# Patient Record
Sex: Female | Born: 1945
Health system: Southern US, Community
[De-identification: ages and names within clinical notes are randomized; demographics above are authoritative.]

## PROBLEM LIST (undated history)

## (undated) DIAGNOSIS — Z9889 Other specified postprocedural states: Secondary | ICD-10-CM

## (undated) DIAGNOSIS — R351 Nocturia: Secondary | ICD-10-CM

## (undated) DIAGNOSIS — M1990 Unspecified osteoarthritis, unspecified site: Secondary | ICD-10-CM

## (undated) DIAGNOSIS — E785 Hyperlipidemia, unspecified: Secondary | ICD-10-CM

## (undated) DIAGNOSIS — K219 Gastro-esophageal reflux disease without esophagitis: Secondary | ICD-10-CM

## (undated) DIAGNOSIS — I509 Heart failure, unspecified: Secondary | ICD-10-CM

## (undated) DIAGNOSIS — R112 Nausea with vomiting, unspecified: Secondary | ICD-10-CM

## (undated) DIAGNOSIS — I10 Essential (primary) hypertension: Secondary | ICD-10-CM

## (undated) HISTORY — PX: TONSILLECTOMY: SUR1361

## (undated) HISTORY — PX: CARDIAC CATHETERIZATION: SHX172

## (undated) HISTORY — PX: COLONOSCOPY: SHX174

## (undated) HISTORY — PX: ADRENAL GLAND SURGERY: SHX544

## (undated) HISTORY — DX: Heart failure, unspecified: I50.9

## (undated) HISTORY — PX: BUNIONECTOMY: SHX129

## (undated) HISTORY — PX: TUBAL LIGATION: SHX77

## (undated) HISTORY — PX: ABDOMINAL HYSTERECTOMY: SHX81

## (undated) HISTORY — PX: JOINT REPLACEMENT: SHX530

---

## 1998-05-10 HISTORY — PX: AUGMENTATION MAMMAPLASTY: SUR837

## 1998-07-03 ENCOUNTER — Ambulatory Visit (HOSPITAL_COMMUNITY): Admission: RE | Admit: 1998-07-03 | Discharge: 1998-07-03 | Payer: Self-pay | Admitting: Specialist

## 1999-01-07 ENCOUNTER — Emergency Department (HOSPITAL_COMMUNITY): Admission: EM | Admit: 1999-01-07 | Discharge: 1999-01-07 | Payer: Self-pay | Admitting: Emergency Medicine

## 1999-05-28 ENCOUNTER — Ambulatory Visit (HOSPITAL_COMMUNITY): Admission: RE | Admit: 1999-05-28 | Discharge: 1999-05-28 | Payer: Self-pay | Admitting: General Surgery

## 1999-12-25 ENCOUNTER — Encounter: Admission: RE | Admit: 1999-12-25 | Discharge: 1999-12-25 | Payer: Self-pay | Admitting: Family Medicine

## 1999-12-25 ENCOUNTER — Encounter: Payer: Self-pay | Admitting: Family Medicine

## 1999-12-30 ENCOUNTER — Encounter: Admission: RE | Admit: 1999-12-30 | Discharge: 1999-12-30 | Payer: Self-pay | Admitting: Family Medicine

## 1999-12-30 ENCOUNTER — Encounter: Payer: Self-pay | Admitting: Family Medicine

## 2000-12-13 ENCOUNTER — Encounter: Admission: RE | Admit: 2000-12-13 | Discharge: 2000-12-13 | Payer: Self-pay | Admitting: Family Medicine

## 2000-12-13 ENCOUNTER — Encounter: Payer: Self-pay | Admitting: Family Medicine

## 2001-04-11 ENCOUNTER — Encounter: Payer: Self-pay | Admitting: Cardiology

## 2001-04-11 ENCOUNTER — Ambulatory Visit (HOSPITAL_COMMUNITY): Admission: RE | Admit: 2001-04-11 | Discharge: 2001-04-11 | Payer: Self-pay | Admitting: Cardiology

## 2001-09-09 ENCOUNTER — Emergency Department (HOSPITAL_COMMUNITY): Admission: EM | Admit: 2001-09-09 | Discharge: 2001-09-09 | Payer: Self-pay | Admitting: Emergency Medicine

## 2001-11-16 ENCOUNTER — Encounter: Payer: Self-pay | Admitting: Emergency Medicine

## 2001-11-16 ENCOUNTER — Emergency Department (HOSPITAL_COMMUNITY): Admission: EM | Admit: 2001-11-16 | Discharge: 2001-11-16 | Payer: Self-pay | Admitting: Emergency Medicine

## 2001-12-13 ENCOUNTER — Encounter: Admission: RE | Admit: 2001-12-13 | Discharge: 2001-12-13 | Payer: Self-pay | Admitting: Cardiology

## 2001-12-13 ENCOUNTER — Encounter: Payer: Self-pay | Admitting: Cardiology

## 2004-07-02 ENCOUNTER — Encounter: Admission: RE | Admit: 2004-07-02 | Discharge: 2004-07-02 | Payer: Self-pay | Admitting: Cardiology

## 2005-01-15 ENCOUNTER — Encounter: Admission: RE | Admit: 2005-01-15 | Discharge: 2005-01-15 | Payer: Self-pay | Admitting: Cardiology

## 2005-01-26 ENCOUNTER — Ambulatory Visit (HOSPITAL_COMMUNITY): Admission: RE | Admit: 2005-01-26 | Discharge: 2005-01-26 | Payer: Self-pay | Admitting: Gastroenterology

## 2006-02-23 ENCOUNTER — Encounter: Admission: RE | Admit: 2006-02-23 | Discharge: 2006-02-23 | Payer: Self-pay | Admitting: Cardiology

## 2008-03-28 ENCOUNTER — Encounter: Admission: RE | Admit: 2008-03-28 | Discharge: 2008-03-28 | Payer: Self-pay | Admitting: Cardiology

## 2008-04-08 ENCOUNTER — Encounter: Admission: RE | Admit: 2008-04-08 | Discharge: 2008-04-08 | Payer: Self-pay | Admitting: Cardiology

## 2008-05-25 ENCOUNTER — Emergency Department (HOSPITAL_COMMUNITY): Admission: EM | Admit: 2008-05-25 | Discharge: 2008-05-25 | Payer: Self-pay | Admitting: Emergency Medicine

## 2010-04-13 ENCOUNTER — Encounter: Admission: RE | Admit: 2010-04-13 | Discharge: 2010-04-13 | Payer: Self-pay | Admitting: Cardiology

## 2010-05-31 ENCOUNTER — Encounter: Payer: Self-pay | Admitting: Cardiology

## 2010-09-25 NOTE — Op Note (Signed)
NAME:  Tara Moreno, Tara Moreno              ACCOUNT NO.:  [id number]   MEDICAL RECORD NO.:  [id number]          PATIENT TYPE:  AMB   LOCATION:  ENDO                         FACILITY:  [facility]   PHYSICIAN:  Shirley Friar, MDDATE OF BIRTH:  [birth year]   DATE OF PROCEDURE:  01/26/2005  DATE OF DISCHARGE:                                 OPERATIVE REPORT   PREOPERATIVE DIAGNOSIS:  Screening.   POSTOPERATIVE DIAGNOSES:  1.  Diverticulosis.  2.  Hemorrhoids.   NAME OF OPERATION:  Colonoscopy.   SEDATION:  Demerol 80 mg, versed 8 mg.   PROCEDURE:  Informed consent was obtained from the patient prior to the  initiation of the procedure.   FINDINGS:  Rectal exam was normal with normal rectal tone and no masses  palpated and no external hemorrhoids noted.  The pediatric adjustable  colonoscope was inserted and advanced to the cecum, where the ileocecal  valve and appendiceal orifice were identified.  The ileocecal valve was  intubated and the terminal ileum was normal in appearance.  Careful  withdrawal of the colonoscope revealed no polyps.  Left-sided diverticulosis  was noted with shallow scattered diverticula noted.  Retroflexion revealed  small internal hemorrhoids.  Otherwise was negative.  The colonoscopy prep  was adequate.   ASSESSMENT:  1.  Left-sided diverticulosis.  2.  Small internal hemorrhoids.   PLAN:  1.  Repeat colonoscopy in 10 years.      Shirley Friar, MD  Electronically Signed     VCS/MEDQ  D:  01/26/2005  T:  01/26/2005  Job:  [id number]   cc:   Althea Grimmer. Luther Parody, M.D.  [address], [address]  [place]  [place] [address]  Fax: [phone]

## 2011-04-25 ENCOUNTER — Emergency Department (HOSPITAL_COMMUNITY)
Admission: EM | Admit: 2011-04-25 | Discharge: 2011-04-25 | Disposition: A | Discharge status: Home / Self Care | Payer: 59 | Attending: Emergency Medicine | Admitting: Emergency Medicine

## 2011-04-25 ENCOUNTER — Encounter: Payer: Self-pay | Admitting: Emergency Medicine

## 2011-04-25 DIAGNOSIS — I10 Essential (primary) hypertension: Secondary | ICD-10-CM

## 2011-04-25 DIAGNOSIS — R040 Epistaxis: Secondary | ICD-10-CM | POA: Insufficient documentation

## 2011-04-25 DIAGNOSIS — Z79899 Other long term (current) drug therapy: Secondary | ICD-10-CM | POA: Insufficient documentation

## 2011-04-25 HISTORY — DX: Essential (primary) hypertension: I10

## 2011-04-25 LAB — CBC
Hemoglobin: 13.4 g/dL (ref 12.0–15.0)
MCH: 29.6 pg (ref 26.0–34.0)
MCHC: 33.1 g/dL (ref 30.0–36.0)
RDW: 13.8 % (ref 11.5–15.5)

## 2011-04-25 LAB — DIFFERENTIAL
Basophils Relative: 0 % (ref 0–1)
Eosinophils Absolute: 0.1 10*3/uL (ref 0.0–0.7)
Monocytes Absolute: 0.7 10*3/uL (ref 0.1–1.0)
Monocytes Relative: 9 % (ref 3–12)
Neutrophils Relative %: 72 % (ref 43–77)

## 2011-04-25 LAB — POCT I-STAT, CHEM 8
Calcium, Ion: 1.24 mmol/L (ref 1.12–1.32)
Glucose, Bld: 110 mg/dL — ABNORMAL HIGH (ref 70–99)
HCT: 41 % (ref 36.0–46.0)
Hemoglobin: 13.9 g/dL (ref 12.0–15.0)
Potassium: 3.7 mEq/L (ref 3.5–5.1)

## 2011-04-25 MED ORDER — AMLODIPINE BESYLATE 10 MG PO TABS: 5.0000 mg | ORAL_TABLET | Freq: Every day | ORAL | Status: DC

## 2011-04-25 MED ORDER — AMLODIPINE BESYLATE 5 MG PO TABS
5.0000 mg | ORAL_TABLET | Freq: Once | ORAL | Status: AC
  Administered 2011-04-25: 5 mg via ORAL
  Filled 2011-04-25: qty 1

## 2011-04-25 NOTE — ED Notes (Signed)
Per pt: pt states that when she bend over she felt a few drops, upon retuning to the bedroom, took a tissue and there noticed that her nose is bleeding. Pt further states that she is not taking her BP medications for the past 6 months.

## 2011-04-25 NOTE — ED Notes (Signed)
EAV:[id number]> Expected date:04/25/11<BR> Expected time: 2:41 AM<BR> Means of arrival:Ambulance<BR> Comments:<BR> Epistaxis-hypertension

## 2011-04-25 NOTE — ED Provider Notes (Signed)
History     CSN: [id number] Arrival date & time: 04/25/2011  2:58 AM   First MD Initiated Contact with Patient 04/25/11 0321      Chief Complaint  Patient presents with  . Epistaxis  . Hypertension    (Consider location/radiation/quality/duration/timing/severity/associated sxs/prior treatment) HPI Comments: Patient woke tonight  with nose bleed from bilateral nareas, held pressure at home without relief.  Has history of HTN use until 6 months ago.  Patient was on multiple blood pressure medications.  Dr. Shana Chute was adjusting these he became ill and the process and she was left with no blood pressure medicines medical guidance.  She does have a positive family history for high blood pressure.  He denies headache, dizziness, swelling in her legs secondary signs of hypertension  Patient is a 65 y.o. female presenting with nosebleeds and hypertension. The history is provided by the patient.  Epistaxis  This is a new problem. The current episode started 1 to 2 hours ago. The problem occurs constantly. The bleeding has been from both nares. She has tried applying pressure for the symptoms.  Hypertension Pertinent negatives include no congestion or weakness.    Past Medical History  Diagnosis Date  . Hypertension     Past Surgical History  Procedure Date  . Abdominal hysterectomy   . Removal of the renal tumor   . Tonsillectomy   . Partial hysterectomy   . Banectomy     Family History  Problem Relation Age of Onset  . Hypertension Mother   . Hypertension Father   . Hypertension Sister   . Hypertension Brother   . Hypertension Other     History  Substance Use Topics  . Smoking status: Never Smoker   . Smokeless tobacco: Not on file  . Alcohol Use: No    OB History    Grav Para Term Preterm Abortions TAB SAB Ect Mult Living                  Review of Systems  Constitutional: Negative.   HENT: Positive for nosebleeds. Negative for congestion and rhinorrhea.     Eyes: Negative.   Respiratory: Negative.   Cardiovascular: Negative.   Genitourinary: Negative.   Neurological: Negative for dizziness and weakness.  Hematological: Negative.   Psychiatric/Behavioral: Negative.     Allergies  Review of patient's allergies indicates no known allergies.  Home Medications   Current Outpatient Rx  Name Route Sig Dispense Refill  . AMLODIPINE BESYLATE 10 MG PO TABS Oral Take 0.5 tablets (5 mg total) by mouth daily. 30 tablet 0    BP 160/92  Pulse 90  Resp 16  SpO2 96%  Physical Exam  Constitutional: She is oriented to person, place, and time. She appears well-developed and well-nourished.  HENT:  Nose: Mucosal edema present. No rhinorrhea or nasal septal hematoma. Epistaxis is observed.       After pressure held no longer bleeding   Eyes: Pupils are equal, round, and reactive to light.  Neck: Normal range of motion.  Cardiovascular: Normal rate.   Pulmonary/Chest: Effort normal.  Musculoskeletal: Normal range of motion.  Neurological: She is oriented to person, place, and time.  Skin: Skin is warm and dry.  Psychiatric: She has a normal mood and affect.    ED Course  Procedures (including critical care time)  Labs Reviewed  POCT I-STAT, CHEM 8 - Abnormal; Notable for the following:    BUN 29 (*)    Glucose, Bld 110 (*)  All other components within normal limits  CBC  DIFFERENTIAL  I-STAT, CHEM 8   No results found.   1. Epistaxis   2. Hypertension      After 5 mg by mouth, Norvasc.  Blood pressures are 154/90.  Will discharge him with prescription for same.  Encouraged her to either find a new primary care physician or make an appointment with her physicians covering team until he returns.  She had no further episodes of epistaxis MDM  Epistaxis due to HTN will eval renal function and medicate         Arman Filter, NP 04/25/11 0342  Arman Filter, NP 04/25/11 0557  Arman Filter, NP 04/25/11 0602  Arman Filter, NP 04/25/11 2303

## 2011-04-25 NOTE — ED Notes (Signed)
Per EMS: pt transported to the ER from home, c/o nose bleed, started around 0145, pt got up to use the bathroom, at that time acute/sudden onset of epistaxis, pt BP over 200's in the truck

## 2011-04-25 NOTE — ED Provider Notes (Signed)
Medical screening examination/treatment/procedure(s) were performed by non-physician practitioner and as supervising physician I was immediately available for consultation/collaboration.   Vida Roller, MD 04/25/11 [phone]

## 2011-05-17 ENCOUNTER — Emergency Department (HOSPITAL_COMMUNITY): Payer: 59

## 2011-05-17 ENCOUNTER — Emergency Department (HOSPITAL_COMMUNITY)
Admission: EM | Admit: 2011-05-17 | Discharge: 2011-05-17 | Disposition: A | Discharge status: Home / Self Care | Payer: 59 | Attending: Emergency Medicine | Admitting: Emergency Medicine

## 2011-05-17 ENCOUNTER — Encounter (HOSPITAL_COMMUNITY): Payer: Self-pay

## 2011-05-17 ENCOUNTER — Other Ambulatory Visit: Payer: Self-pay

## 2011-05-17 DIAGNOSIS — R42 Dizziness and giddiness: Secondary | ICD-10-CM

## 2011-05-17 DIAGNOSIS — R040 Epistaxis: Secondary | ICD-10-CM | POA: Insufficient documentation

## 2011-05-17 DIAGNOSIS — I10 Essential (primary) hypertension: Secondary | ICD-10-CM | POA: Insufficient documentation

## 2011-05-17 DIAGNOSIS — R51 Headache: Secondary | ICD-10-CM | POA: Insufficient documentation

## 2011-05-17 HISTORY — DX: Unspecified osteoarthritis, unspecified site: M19.90

## 2011-05-17 LAB — BASIC METABOLIC PANEL
Chloride: 102 mEq/L (ref 96–112)
Creatinine, Ser: 1.01 mg/dL (ref 0.50–1.10)
GFR calc Af Amer: 67 mL/min — ABNORMAL LOW (ref >90)
Potassium: 3.5 mEq/L (ref 3.5–5.1)
Sodium: 138 mEq/L (ref 135–145)

## 2011-05-17 LAB — DIFFERENTIAL
Basophils Absolute: 0 10*3/uL (ref 0.0–0.1)
Basophils Relative: 0 % (ref 0–1)
Neutro Abs: 4.5 10*3/uL (ref 1.7–7.7)
Neutrophils Relative %: 60 % (ref 43–77)

## 2011-05-17 LAB — URINALYSIS, ROUTINE W REFLEX MICROSCOPIC
Ketones, ur: NEGATIVE mg/dL
Nitrite: NEGATIVE
Specific Gravity, Urine: 1.012 (ref 1.005–1.030)
pH: 5.5 (ref 5.0–8.0)

## 2011-05-17 LAB — CBC
MCHC: 33 g/dL (ref 30.0–36.0)
Platelets: 258 10*3/uL (ref 150–400)
RDW: 12.7 % (ref 11.5–15.5)

## 2011-05-17 MED ORDER — SODIUM CHLORIDE 0.9 % IV BOLUS (SEPSIS)
500.0000 mL | Freq: Once | INTRAVENOUS | Status: AC
  Administered 2011-05-17: 500 mL via INTRAVENOUS

## 2011-05-17 MED ORDER — SODIUM CHLORIDE 0.9 % IV SOLN
INTRAVENOUS | Status: DC
  Administered 2011-05-17: 125 mL via INTRAVENOUS

## 2011-05-17 NOTE — ED Provider Notes (Signed)
History     CSN: [id number]  Arrival date & time 05/17/11  1344   First MD Initiated Contact with Patient 05/17/11 1510      No chief complaint on file.   (Consider location/radiation/quality/duration/timing/severity/associated sxs/prior treatment) Patient is a 66 y.o. female presenting with headaches.  Headache  This is a new problem. The current episode started 1 to 2 hours ago. The problem occurs constantly. The problem has not changed since onset.Associated with: It was preceded by dizziness characterized by "wavy"  mvt. The pain is located in the right unilateral region. The pain is mild. The pain does not radiate. Pertinent negatives include no anorexia, no fever, no nausea and no vomiting. Associated symptoms comments: Her right eye feels heavy and her left arm hurts.. She has tried nothing for the symptoms.   She states she took her blood pressure with an automated machine, and it was 98/40.  Past Medical History  Diagnosis Date  . Hypertension   . Arthritis     Past Surgical History  Procedure Date  . Abdominal hysterectomy   . Removal of the renal tumor   . Tonsillectomy   . Partial hysterectomy   . Banectomy     Family History  Problem Relation Age of Onset  . Hypertension Mother   . Hypertension Father   . Hypertension Sister   . Hypertension Brother   . Hypertension Other     History  Substance Use Topics  . Smoking status: Never Smoker   . Smokeless tobacco: Not on file  . Alcohol Use: No    OB History    Grav Para Term Preterm Abortions TAB SAB Ect Mult Living                  Review of Systems  Constitutional: Negative for fever.  Gastrointestinal: Negative for nausea, vomiting and anorexia.  Neurological: Positive for headaches.  All other systems reviewed and are negative.    Allergies  Review of patient's allergies indicates no known allergies.  Home Medications   Current Outpatient Rx  Name Route Sig Dispense Refill  .  AMLODIPINE-VALSARTAN-HCTZ 5-160-25 MG PO TABS Oral Take 1 tablet by mouth daily.      . COQ10 PO Oral Take 1 tablet by mouth daily.      . MELOXICAM 7.5 MG PO TABS Oral Take 7.5 mg by mouth daily.      Marland Kitchen NIACIN 500 MG PO TABS Oral Take 500 mg by mouth daily.      Marland Kitchen FISH OIL 1000 MG PO CAPS Oral Take 2,000 mg by mouth daily.      Marland Kitchen PROGESTERONE 4 % VA GEL Vaginal Place 1 application vaginally daily.        BP 137/67  Pulse 78  Temp(Src) 97.9 F (36.6 C) (Oral)  Resp 24  SpO2 100%  Physical Exam  Nursing note and vitals reviewed. Constitutional: She is oriented to person, place, and time. She appears well-developed and well-nourished.  HENT:  Head: Normocephalic and atraumatic.  Eyes: Conjunctivae and EOM are normal. Pupils are equal, round, and reactive to light.  Neck: Normal range of motion and phonation normal. Neck supple.  Cardiovascular: Normal rate, regular rhythm and intact distal pulses.   Pulmonary/Chest: Effort normal and breath sounds normal. She exhibits no tenderness.  Abdominal: Soft. She exhibits no distension. There is no tenderness. There is no guarding.  Musculoskeletal: Normal range of motion.  Neurological: She is alert and oriented to person, place, and time. She  has normal strength and normal reflexes. No cranial nerve deficit. She exhibits normal muscle tone. Coordination normal.       No nystagmus.  Skin: Skin is warm and dry.  Psychiatric: She has a normal mood and affect. Her behavior is normal. Judgment and thought content normal.    ED Course  Procedures (including critical care time) Orthostatics negatives; ambulation trial; the patient walked easily.  6:21 PM Reevaluation with update and discussion. After initial assessment and treatment, an updated evaluation reveals the patient is improved. She states she has a fluttering sensation in her right eye. She also has intermittent left arm pain that resolves when she moves it. Matvey Llanas L     Labs  Reviewed  BASIC METABOLIC PANEL - Abnormal; Notable for the following:    GFR calc non Af Amer 58 (*)    GFR calc Af Amer 67 (*)    All other components within normal limits  URINALYSIS, ROUTINE W REFLEX MICROSCOPIC - Abnormal; Notable for the following:    Leukocytes, UA TRACE (*)    All other components within normal limits  URINE MICROSCOPIC-ADD ON - Abnormal; Notable for the following:    Squamous Epithelial / LPF FEW (*)    All other components within normal limits  CBC  DIFFERENTIAL  TROPONIN I   Dg Chest 2 View  05/17/2011  *RADIOLOGY REPORT*  Clinical Data: Dizziness.  Nose bleed.  CHEST - 2 VIEW  Comparison: 03/28/2008  Findings: The patient has chronic cardiomegaly.  Vascularity is normal and the lungs are clear.  No acute osseous abnormality.  IMPRESSION: No acute abnormalities.  Chronic cardiomegaly.  Original Report Authenticated By: Gwynn Burly, M.D.   Ct Head Wo Contrast  05/17/2011  *RADIOLOGY REPORT*  Clinical Data: Headaches and dizziness.  CT HEAD WITHOUT CONTRAST  Technique:  Contiguous axial images were obtained from the base of the skull through the vertex without contrast.  Comparison: None.  Findings: There is no acute intracranial hemorrhage, infarction, or mass.  Brain parenchyma is normal.  Osseous structures are normal.  IMPRESSION: Normal exam.  Original Report Authenticated By: Gwynn Burly, M.D.     1. Dizziness       MDM  Nonspecific dizziness with negative evaluation in the emergency department. Symptoms are transient and have resolved. The posterior circulation stroke, occult infection, metabolic instability.        Flint Melter, MD 05/17/11 Rickey Primus

## 2011-05-17 NOTE — ED Notes (Signed)
Pt ambulated to and from restroom with no complaints of dizziness.

## 2011-05-17 NOTE — ED Notes (Signed)
Patient aware of need for urine testing. Patient unable to void at this time. Patient encouraged to call for toileting assistance  

## 2011-05-17 NOTE — ED Notes (Signed)
Per ems, pt picked up from work,  C/o dizziness started today. Last BP 140/90, HR 100. No sob, denied fall. Pt alert, oriented. Pt has recent change in med

## 2012-08-10 ENCOUNTER — Ambulatory Visit (INDEPENDENT_AMBULATORY_CARE_PROVIDER_SITE_OTHER): Payer: 59 | Admitting: Cardiovascular Disease

## 2012-08-10 ENCOUNTER — Encounter: Payer: Self-pay | Admitting: Cardiovascular Disease

## 2012-08-10 VITALS — BP 128/90 | HR 78 | Ht 67.5 in | Wt 240.4 lb

## 2012-08-10 DIAGNOSIS — R079 Chest pain, unspecified: Secondary | ICD-10-CM

## 2012-08-10 DIAGNOSIS — R0789 Other chest pain: Secondary | ICD-10-CM | POA: Insufficient documentation

## 2012-08-10 NOTE — Assessment & Plan Note (Signed)
Tara Moreno is a 67 yo with hx of HTN who presents with a severe episode of chest discomfort that woke her up from sleep. She has a history of gastroesophageal reflux but this pain did not feel like her typical reflux pain.  The pain lasted for 45 minutes. It was associated with some radiation to the arm. She also has chronic arm pain that seems to be separate from this.  EKG does not show any acute abnormalities. I think it would be important for [place] to rule out coronary artery disease. We'll schedule her for a 2 day [employer]  She does not get any regular exercise and does not think that she would deal to walk on a regular treadmill.  I will see her again in 3 months for followup visit.  I have asked her to start a regular exercise program.

## 2012-08-10 NOTE — Progress Notes (Signed)
     [name] Date of Birth  1945/10/01       [facility]    [place] [address]  [address] [place], [place]  [address]   [place], [place]  [address] [phone]     [phone]   Fax  [phone]    Fax [phone]  Problem List: 1. Hypertension 2. Arm pain 3. GERD   History of Present Illness:  Ms. Frost is a 67 yo with a recent episode of CP.  She woke up with a knot in the middle of her chest.  She has acid reflux but this did not feel like her acid reflux.  The pain lasted for 45 minutes and resolved spontaneously.  She was also having arm pain but this seemed to be positional.  The CP has not recurred.  She has had the arm pain and neck pain.     She also has pain in the back of her arms and neck.    Works a  Nurse, mental health.  She walks at work.  She does not walk at home.  She knows that she needs to walk.  Current Outpatient Prescriptions on File Prior to Visit  Medication Sig Dispense Refill  . niacin 500 MG tablet Take 500 mg by mouth daily.        . Omega-3 Fatty Acids (FISH OIL) 1000 MG CAPS Take 2,000 mg by mouth daily.         No current facility-administered medications on file prior to visit.    No Known Allergies  Past Medical History  Diagnosis Date  . Hypertension   . Arthritis     Past Surgical History  Procedure Laterality Date  . Abdominal hysterectomy    . Removal of the renal tumor    . Tonsillectomy    . Partial hysterectomy    . Banectomy      History  Smoking status  . Never Smoker   Smokeless tobacco  . Not on file    History  Alcohol Use No    Family History  Problem Relation Age of Onset  . Hypertension Mother   . Hypertension Father   . Hypertension Sister   . Hypertension Brother   . Hypertension Other     Reviw of Systems:  Reviewed in the HPI.  All other systems are negative.  Physical Exam: Blood pressure 128/90, pulse 78, height 5' 7.5" (1.715 m), weight 240  lb 6.4 oz (109.045 kg), SpO2 97.00%. General: Well developed, well nourished, in no acute distress.  Head: Normocephalic, atraumatic, sclera non-icteric, mucus membranes are moist,   Neck: Supple. Carotids are 2 + without bruits. No JVD   Lungs: Clear   Heart: RR, normal S1, S2, no murmurs  Abdomen: Soft, non-tender, non-distended with normal bowel sounds.  Msk:  Strength and tone are normal   Extremities: No clubbing or cyanosis. No edema.  Distal pedal pulses are 2+ and equal    Neuro: CN II - XII intact.  Alert and oriented X 3.   Psych:  Normal   ECG: August 10, 2012:  NSR at 78, low voltage. Otherwise normal  Assessment / Plan:

## 2012-08-10 NOTE — Patient Instructions (Addendum)
Your physician has requested that you have a lexiscan myoview.  Please follow instruction sheet, as given.  Your physician recommends that you schedule a follow-up appointment in: approx 3 months with Dr Elease Hashimoto  Your physician recommends that you continue on your current medications as directed. Please refer to the Current Medication list given to you today.

## 2012-08-16 ENCOUNTER — Ambulatory Visit (HOSPITAL_COMMUNITY): Payer: 59 | Attending: Cardiovascular Disease | Admitting: Radiology

## 2012-08-16 VITALS — BP 128/83 | Ht 67.5 in | Wt 239.0 lb

## 2012-08-16 DIAGNOSIS — R079 Chest pain, unspecified: Secondary | ICD-10-CM

## 2012-08-16 DIAGNOSIS — I10 Essential (primary) hypertension: Secondary | ICD-10-CM | POA: Insufficient documentation

## 2012-08-16 DIAGNOSIS — R0789 Other chest pain: Secondary | ICD-10-CM

## 2012-08-16 DIAGNOSIS — Z87891 Personal history of nicotine dependence: Secondary | ICD-10-CM | POA: Insufficient documentation

## 2012-08-16 MED ORDER — REGADENOSON 0.4 MG/5ML IV SOLN
0.4000 mg | Freq: Once | INTRAVENOUS | Status: AC
  Administered 2012-08-16: 0.4 mg via INTRAVENOUS

## 2012-08-16 MED ORDER — TECHNETIUM TC 99M SESTAMIBI GENERIC - CARDIOLITE
11.0000 | Freq: Once | INTRAVENOUS | Status: AC | PRN
  Administered 2012-08-16: 11 via INTRAVENOUS

## 2012-08-16 MED ORDER — TECHNETIUM TC 99M SESTAMIBI GENERIC - CARDIOLITE
33.0000 | Freq: Once | INTRAVENOUS | Status: AC | PRN
  Administered 2012-08-16: 33 via INTRAVENOUS

## 2012-08-16 NOTE — Progress Notes (Signed)
[facility] SITE 3 NUCLEAR MED [address] [place], [place] [address] [phone]    Cardiology Nuclear Med Study  Tara Moreno is a 67 y.o. female     MRN : [id number]     DOB: [birth year]  Procedure Date: 08/16/2012  Nuclear Med Background Indication for Stress Test:  Evaluation for Ischemia History:  20 yrs ago GXT: NL per pt, No Hx of CAD Cardiac Risk Factors: History of Smoking, Hypertension and Lipids  Symptoms:  Chest Tightness    Nuclear Pre-Procedure Caffeine/Decaff Intake:  None NPO After: 9:00pm   Lungs:  clear O2 Sat: 95% on room air. IV 0.9% NS with Angio Cath:  22g  IV Site: R Hand  IV Started by:  Bonnita Levan, RN  Chest Size (in):  44 Cup Size: D  Height: 5' 7.5" (1.715 m)  Weight:  239 lb (108.41 kg)  BMI:  Body mass index is 36.86 kg/(m^2). Tech Comments:  N/A    Nuclear Med Study 1 or 2 day study: 1 day  Stress Test Type:  Lexiscan  Reading MD: Kristeen Miss, MD  Order Authorizing Provider:  Kristeen Miss, MD  Resting Radionuclide: Technetium 65m Sestamibi  Resting Radionuclide Dose: 11.0 mCi   Stress Radionuclide:  Technetium 30m Sestamibi  Stress Radionuclide Dose: 33.0 mCi           Stress Protocol Rest HR: 74 Stress HR: 92  Rest BP: 128/83 Stress BP: 133/85  Exercise Time (min): n/a METS: n/a   Predicted Max HR: 156 bpm % Max HR: 58.97 bpm Rate Pressure Product: 21308   Dose of Adenosine (mg):  n/a Dose of Lexiscan: 0.4 mg  Dose of Atropine (mg): n/a Dose of Dobutamine: n/a mcg/kg/min (at max HR)  Stress Test Technologist: Milana Na, EMT-P  Nuclear Technologist:  Doyne Keel, CNMT     Rest Procedure:  Myocardial perfusion imaging was performed at rest 45 minutes following the intravenous administration of Technetium 65m Sestamibi. Rest ECG: NSR - Normal EKG  Stress Procedure:  The patient received IV Lexiscan 0.4 mg over 15-seconds.  Technetium 85m Sestamibi injected at 30-seconds.  This patient had (R) leg pain and  was lt. Headed with the Lexiscan injection.Quantitative spect images were obtained after a 45 minute delay. Stress ECG: No significant change from baseline ECG  QPS Raw Data Images:  Normal; no motion artifact; normal heart/lung ratio. Stress Images:  Normal homogeneous uptake in all areas of the myocardium. Rest Images:  Normal homogeneous uptake in all areas of the myocardium. Subtraction (SDS):  No evidence of ischemia. Transient Ischemic Dilatation (Normal <1.22):  0.97 Lung/Heart Ratio (Normal <0.45):  0.19  Quantitative Gated Spect Images QGS EDV:  74 ml QGS ESV:  18 ml  Impression Exercise Capacity:  Lexiscan with no exercise. BP Response:  Normal blood pressure response. Clinical Symptoms:  No significant symptoms noted. ECG Impression:  No significant ST segment change suggestive of ischemia. Comparison with Prior Nuclear Study: No images to compare  Overall Impression:  Normal stress nuclear study.   No evidence of ischemia.  Normal LV function.  LV Ejection Fraction: 76%.  LV Wall Motion:  NL LV Function; NL Wall Motion.   Vesta Mixer, Montez Hageman., MD, [facility] 08/16/2012, 3:59 PM Office - [phone] Pager [phone]

## 2012-11-03 ENCOUNTER — Ambulatory Visit: Payer: 59 | Admitting: Cardiovascular Disease

## 2013-01-03 ENCOUNTER — Ambulatory Visit
Admission: RE | Admit: 2013-01-03 | Discharge: 2013-01-03 | Disposition: A | Discharge status: Home / Self Care | Payer: 59 | Source: Ambulatory Visit | Attending: Cardiology | Admitting: Cardiology

## 2013-01-03 ENCOUNTER — Other Ambulatory Visit: Payer: Self-pay | Admitting: Cardiology

## 2013-01-03 DIAGNOSIS — M542 Cervicalgia: Secondary | ICD-10-CM

## 2014-02-21 NOTE — Pre-Procedure Instructions (Signed)
Tara Moreno  02/21/2014   Your procedure is scheduled on:  Friday, October 30th  Report to [facility] Admitting at 530 AM.  Call this number if you have problems the morning of surgery: [phone]   Remember:   Do not eat food or drink liquids after midnight.   Take these medicines the morning of surgery with A SIP OF WATER: omeprazole, azor   Do not wear jewelry, make-up or nail polish.  Do not wear lotions, powders, or perfumes, deodorant.  Do not shave 48 hours prior to surgery. Men may shave face and neck.  Do not bring valuables to the hospital.  [facility] is not responsible  for any belongings or valuables.               Contacts, dentures or bridgework may not be worn into surgery.  Leave suitcase in the car. After surgery it may be brought to your room.  For patients admitted to the hospital, discharge time is determined by your treatment team.               Patients discharged the day of surgery will not be allowed to drive home.  Please read over the following fact sheets that you were given: Pain Booklet, Coughing and Deep Breathing and Surgical Site Infection Prevention [place] - Preparing for Surgery  Before surgery, you can play an important role.  Because skin is not sterile, your skin needs to be as free of germs as possible.  You can reduce the number of germs on you skin by washing with CHG (chlorahexidine gluconate) soap before surgery.  CHG is an antiseptic cleaner which kills germs and bonds with the skin to continue killing germs even after washing.  Please DO NOT use if you have an allergy to CHG or antibacterial soaps.  If your skin becomes reddened/irritated stop using the CHG and inform your nurse when you arrive at Short Stay.  Do not shave (including legs and underarms) for at least 48 hours prior to the first CHG shower.  You may shave your face.  Please follow these instructions carefully:   1.  Shower with CHG Soap the night  before surgery and the morning of Surgery.  2.  If you choose to wash your hair, wash your hair first as usual with your normal shampoo.  3.  After you shampoo, rinse your hair and body thoroughly to remove the shampoo.  4.  Use CHG as you would any other liquid soap.  You can apply CHG directly to the skin and wash gently with scrungie or a clean washcloth.  5.  Apply the CHG Soap to your body ONLY FROM THE NECK DOWN.  Do not use on open wounds or open sores.  Avoid contact with your eyes, ears, mouth and genitals (private parts).  Wash genitals (private parts) with your normal soap.  6.  Wash thoroughly, paying special attention to the area where your surgery will be performed.  7.  Thoroughly rinse your body with warm water from the neck down.  8.  DO NOT shower/wash with your normal soap after using and rinsing off the CHG Soap.  9.  Pat yourself dry with a clean towel.            10.  Wear clean pajamas.            11.  Place clean sheets on your bed the night of your first shower and do not  sleep with pets.  Day of Surgery  Do not apply any lotions/deoderants the morning of surgery.  Please wear clean clothes to the hospital/surgery center.

## 2014-02-22 ENCOUNTER — Encounter (HOSPITAL_COMMUNITY)
Admission: RE | Admit: 2014-02-22 | Discharge: 2014-02-22 | Disposition: A | Discharge status: Home / Self Care | Payer: Medicare Other | Source: Ambulatory Visit | Attending: Orthopedic Surgery | Admitting: Orthopedic Surgery

## 2014-02-22 ENCOUNTER — Encounter (HOSPITAL_COMMUNITY)
Admission: RE | Admit: 2014-02-22 | Discharge: 2014-02-22 | Disposition: A | Discharge status: Home / Self Care | Payer: Medicare Other | Source: Ambulatory Visit | Attending: Anesthesiology | Admitting: Anesthesiology

## 2014-02-22 ENCOUNTER — Encounter (HOSPITAL_COMMUNITY): Payer: Self-pay

## 2014-02-22 DIAGNOSIS — M19011 Primary osteoarthritis, right shoulder: Secondary | ICD-10-CM | POA: Insufficient documentation

## 2014-02-22 DIAGNOSIS — I10 Essential (primary) hypertension: Secondary | ICD-10-CM | POA: Diagnosis not present

## 2014-02-22 DIAGNOSIS — Z01818 Encounter for other preprocedural examination: Secondary | ICD-10-CM | POA: Insufficient documentation

## 2014-02-22 HISTORY — DX: Other specified postprocedural states: R11.2

## 2014-02-22 HISTORY — DX: Other specified postprocedural states: Z98.890

## 2014-02-22 HISTORY — DX: Hyperlipidemia, unspecified: E78.5

## 2014-02-22 HISTORY — DX: Gastro-esophageal reflux disease without esophagitis: K21.9

## 2014-02-22 HISTORY — DX: Nocturia: R35.1

## 2014-02-22 LAB — CBC
HCT: 41 % (ref 36.0–46.0)
HEMOGLOBIN: 13.7 g/dL (ref 12.0–15.0)
MCH: 29.9 pg (ref 26.0–34.0)
MCHC: 33.4 g/dL (ref 30.0–36.0)
MCV: 89.5 fL (ref 78.0–100.0)
PLATELETS: 257 10*3/uL (ref 150–400)
RBC: 4.58 MIL/uL (ref 3.87–5.11)
RDW: 14.1 % (ref 11.5–15.5)
WBC: 8.3 10*3/uL (ref 4.0–10.5)

## 2014-02-22 LAB — BASIC METABOLIC PANEL
ANION GAP: 16 — AB (ref 5–15)
BUN: 26 mg/dL — ABNORMAL HIGH (ref 6–23)
CALCIUM: 9.9 mg/dL (ref 8.4–10.5)
CHLORIDE: 99 meq/L (ref 96–112)
CO2: 21 meq/L (ref 19–32)
Creatinine, Ser: 0.68 mg/dL (ref 0.50–1.10)
GFR calc Af Amer: >90 mL/min (ref >90)
GFR calc non Af Amer: 89 mL/min — ABNORMAL LOW (ref >90)
Glucose, Bld: 96 mg/dL (ref 70–99)
POTASSIUM: 5.3 meq/L (ref 3.7–5.3)
SODIUM: 136 meq/L — AB (ref 137–147)

## 2014-02-22 LAB — TYPE AND SCREEN
ABO/RH(D): O POS
ANTIBODY SCREEN: NEGATIVE

## 2014-02-22 LAB — ABO/RH: ABO/RH(D): O POS

## 2014-02-22 NOTE — Pre-Procedure Instructions (Signed)
Tara Moreno  02/22/2014   Your procedure is scheduled on:  Friday, March 08, 2014 at 7:30 AM.  Report to [facility] Admitting at 5:30 AM.  Call this number if you have problems the morning of surgery: [phone]   Remember:   Do not eat food or drink liquids after midnight.   Take these medicines the morning of surgery with A SIP OF WATER: Protonix, Azor   Please discontinue any herbal medications on 03/01/14   Do not wear jewelry, make-up or nail polish.  Do not wear lotions, powders, or perfumes, deodorant.  Do not shave 48 hours prior to surgery.   Do not bring valuables to the hospital.  [facility] is not responsible  for any belongings or valuables.               Contacts, dentures or bridgework may not be worn into surgery.  Leave suitcase in the car. After surgery it may be brought to your room.  For patients admitted to the hospital, discharge time is determined by your treatment team.               Patients discharged the day of surgery will not be allowed to drive home.  Please read over the following fact sheets that you were given: Pain Booklet, Coughing and Deep Breathing and Surgical Site Infection Prevention [place] - Preparing for Surgery  Before surgery, you can play an important role.  Because skin is not sterile, your skin needs to be as free of germs as possible.  You can reduce the number of germs on you skin by washing with CHG (chlorahexidine gluconate) soap before surgery.  CHG is an antiseptic cleaner which kills germs and bonds with the skin to continue killing germs even after washing.  Please DO NOT use if you have an allergy to CHG or antibacterial soaps.  If your skin becomes reddened/irritated stop using the CHG and inform your nurse when you arrive at Short Stay.  Do not shave (including legs and underarms) for at least 48 hours prior to the first CHG shower.  You may shave your face.  Please follow these instructions  carefully:   1.  Shower with CHG Soap the night before surgery and the morning of Surgery.  2.  If you choose to wash your hair, wash your hair first as usual with your normal shampoo.  3.  After you shampoo, rinse your hair and body thoroughly to remove the shampoo.  4.  Use CHG as you would any other liquid soap.  You can apply CHG directly to the skin and wash gently with scrungie or a clean washcloth.  5.  Apply the CHG Soap to your body ONLY FROM THE NECK DOWN.  Do not use on open wounds or open sores.  Avoid contact with your eyes, ears, mouth and genitals (private parts).  Wash genitals (private parts) with your normal soap.  6.  Wash thoroughly, paying special attention to the area where your surgery will be performed.  7.  Thoroughly rinse your body with warm water from the neck down.  8.  DO NOT shower/wash with your normal soap after using and rinsing off the CHG Soap.  9.  Pat yourself dry with a clean towel.            10.  Wear clean pajamas.            11.  Place clean sheets on your bed the  night of your first shower and do not sleep with pets.  Day of Surgery  Do not apply any lotions/deoderants the morning of surgery.  Please wear clean clothes to the hospital/surgery center.

## 2014-02-22 NOTE — Progress Notes (Signed)
Anesthesia Chart Review:   Pt is 68 year old female scheduled for R total shoulder arthroplasty vs a reverse arthroplasty on 03/08/14 with Dr. Veverly Fells.   PMH: HTN, hyperlipidemia, GERD  Medications include: amlodipine/olmesartan, lipitor, protonix  Preoperative labs reviewed.  BUN 26, Cr 0.68.   Chest x-ray reviewed. No edema or consolidation. Stable cardiac enlargement.  EKG: NSR. Possible LA enlargement. LBBB. This is a new finding from previous EKG 08/10/2012.   Nuclear med stress test 08/16/2012: Normal stress nuclear study. No evidence of ischemia. Normal LV function. LV Ejection Fraction: 76%. LV Wall Motion: NL LV Function; NL Wall Motion.  Discussed pt with Dr. Tobias Alexander.    If no changes, I anticipate pt can proceed with surgery as scheduled.    Willeen Cass, FNP-BC [facility] Short Stay Surgical Center/Anesthesiology Phone: [phone] 02/22/2014 5:00 PM

## 2014-02-22 NOTE — Progress Notes (Signed)
Patient denied having any chest pain or shortness of breath. PCP is [facility].

## 2014-02-27 NOTE — H&P (Signed)
Tara Moreno is an 68 y.o. female.    Chief Complaint: right shoulder pain  HPI: Pt is a 68 y.o. female complaining of right shoulder pain for multiple years. Pain had continually increased since the beginning. X-rays in the clinic show end-stage arthritic changes of the right shoulder. Pt has tried various conservative treatments which have failed to alleviate their symptoms, including injections and therapy. Various options are discussed with the patient. Risks, benefits and expectations were discussed with the patient. Patient understand the risks, benefits and expectations and wishes to proceed with surgery.   PCP:  Patricia Nettle, MD  D/C Plans:  Home with HHPT  PMH: Past Medical History  Diagnosis Date  . Hypertension   . Arthritis   . PONV (postoperative nausea and vomiting)   . Frequent urination at night   . GERD (gastroesophageal reflux disease)   . Hyperlipemia     PSH: Past Surgical History  Procedure Laterality Date  . Abdominal hysterectomy    . Removal of the renal tumor    . Tonsillectomy    . Partial hysterectomy    . Banectomy    . Colonoscopy    . Breast enhancement surgery  2000  . Cardiac catheterization      no PCI; had before renal tumor was removed    Social History:  reports that she has never smoked. She does not have any smokeless tobacco history on file. She reports that she does not drink alcohol or use illicit drugs.  Allergies:  No Known Allergies  Medications: No current facility-administered medications for this encounter.   Current Outpatient Prescriptions  Medication Sig Dispense Refill  . amLODipine-olmesartan (AZOR) 10-40 MG per tablet Take 1 tablet by mouth daily.      Marland Kitchen atorvastatin (LIPITOR) 10 MG tablet Take 10 mg by mouth daily.      Nyoka Cowden Tea, Camillia sinensis, (GREEN TEA EXTRACT PO) Take 1 capsule by mouth daily.      . meloxicam (MOBIC) 15 MG tablet Take 15 mg by mouth daily.      Marland Kitchen MILK THISTLE PO Take 1 capsule  by mouth daily.      . Misc Natural Products (OSTEO BI-FLEX ADV TRIPLE ST PO) Take by mouth daily.      . niacin 500 MG tablet Take 500 mg by mouth daily.        . Omega-3 Fatty Acids (FISH OIL) 1000 MG CAPS Take 3,000 mg by mouth daily.       . pantoprazole (PROTONIX) 40 MG tablet Take 40 mg by mouth daily.      . Probiotic Product (PROBIOTIC DAILY PO) Take by mouth daily.      Marland Kitchen UBIQUINOL PO Take 100 mg by mouth daily.        No results found for this or any previous visit (from the past 48 hour(s)). No results found.  ROS: Pain and weakness with rom of the right upper extremity  Physical Exam:  Alert and oriented 68 y.o. female in no acute distress Cranial nerves 2-12 intact Cervical spine: full rom with no tenderness, nv intact distally Chest: active breath sounds bilaterally, no wheeze rhonchi or rales Heart: regular rate and rhythm, no murmur Abd: non tender non distended with active bowel sounds Hip is stable with rom  Right shoulder with limited rom ER and IR strength 3.5/5 as compared with left nv intact distally No rashes or edema  Assessment/Plan Assessment: right shoulder end stage osteoarthritis and possible cuff insufficiency  Plan: Patient will undergo a right total vs reverse total shoulder arthroplasty by Dr. Veverly Fells at [facility]. Risks benefits and expectations were discussed with the patient. Patient understand risks, benefits and expectations and wishes to proceed.

## 2014-03-07 MED ORDER — CEFAZOLIN SODIUM-DEXTROSE 2-3 GM-% IV SOLR
2.0000 g | INTRAVENOUS | Status: AC
  Administered 2014-03-08: 2 g via INTRAVENOUS
  Filled 2014-03-07: qty 50

## 2014-03-07 MED ORDER — CHLORHEXIDINE GLUCONATE 4 % EX LIQD
60.0000 mL | Freq: Once | CUTANEOUS | Status: DC
  Filled 2014-03-07: qty 60

## 2014-03-08 ENCOUNTER — Inpatient Hospital Stay (HOSPITAL_COMMUNITY): Payer: Medicare Other | Admitting: Anesthesiology

## 2014-03-08 ENCOUNTER — Encounter (HOSPITAL_COMMUNITY): Payer: Medicare Other | Admitting: Emergency Medicine

## 2014-03-08 ENCOUNTER — Encounter (HOSPITAL_COMMUNITY)
Admission: RE | Disposition: A | Discharge status: Home Health | Payer: Self-pay | Source: Ambulatory Visit | Attending: Orthopedic Surgery

## 2014-03-08 ENCOUNTER — Inpatient Hospital Stay (HOSPITAL_COMMUNITY): Payer: Medicare Other

## 2014-03-08 ENCOUNTER — Inpatient Hospital Stay (HOSPITAL_COMMUNITY)
Admission: RE | Admit: 2014-03-08 | Discharge: 2014-03-10 | DRG: 465 | Disposition: A | Discharge status: Home Health | Payer: Medicare Other | Source: Ambulatory Visit | Attending: Orthopedic Surgery | Admitting: Orthopedic Surgery

## 2014-03-08 ENCOUNTER — Encounter (HOSPITAL_COMMUNITY): Payer: Self-pay | Admitting: Anesthesiology

## 2014-03-08 DIAGNOSIS — E785 Hyperlipidemia, unspecified: Secondary | ICD-10-CM | POA: Diagnosis present

## 2014-03-08 DIAGNOSIS — D179 Benign lipomatous neoplasm, unspecified: Secondary | ICD-10-CM | POA: Diagnosis present

## 2014-03-08 DIAGNOSIS — Z79899 Other long term (current) drug therapy: Secondary | ICD-10-CM

## 2014-03-08 DIAGNOSIS — I10 Essential (primary) hypertension: Secondary | ICD-10-CM | POA: Diagnosis present

## 2014-03-08 DIAGNOSIS — R350 Frequency of micturition: Secondary | ICD-10-CM | POA: Diagnosis present

## 2014-03-08 DIAGNOSIS — M19011 Primary osteoarthritis, right shoulder: Secondary | ICD-10-CM | POA: Diagnosis present

## 2014-03-08 DIAGNOSIS — K219 Gastro-esophageal reflux disease without esophagitis: Secondary | ICD-10-CM | POA: Diagnosis present

## 2014-03-08 HISTORY — PX: REVERSE SHOULDER ARTHROPLASTY: SHX5054

## 2014-03-08 HISTORY — PX: REVISION TOTAL SHOULDER ARTHROPLASTY: SHX1040

## 2014-03-08 SURGERY — ARTHROPLASTY, SHOULDER, TOTAL, REVERSE
Anesthesia: Regional | Site: Shoulder | Laterality: Right

## 2014-03-08 MED ORDER — ONDANSETRON HCL 4 MG/2ML IJ SOLN
4.0000 mg | Freq: Four times a day (QID) | INTRAMUSCULAR | Status: DC | PRN
  Administered 2014-03-09: 4 mg via INTRAVENOUS
  Filled 2014-03-08 (×2): qty 2

## 2014-03-08 MED ORDER — OXYCODONE HCL 5 MG/5ML PO SOLN: 5.0000 mg | Freq: Once | ORAL | Status: DC | PRN

## 2014-03-08 MED ORDER — SODIUM CHLORIDE 0.9 % IV SOLN
INTRAVENOUS | Status: DC
  Administered 2014-03-09: via INTRAVENOUS

## 2014-03-08 MED ORDER — NIACIN 500 MG PO TABS
500.0000 mg | ORAL_TABLET | Freq: Every day | ORAL | Status: DC
  Administered 2014-03-08 – 2014-03-10 (×3): 500 mg via ORAL
  Filled 2014-03-08 (×3): qty 1

## 2014-03-08 MED ORDER — OXYCODONE HCL 5 MG PO TABS: 5.0000 mg | ORAL_TABLET | Freq: Once | ORAL | Status: DC | PRN

## 2014-03-08 MED ORDER — SUCCINYLCHOLINE CHLORIDE 20 MG/ML IJ SOLN
INTRAMUSCULAR | Status: AC
  Filled 2014-03-08: qty 1

## 2014-03-08 MED ORDER — SODIUM CHLORIDE 0.9 % IR SOLN
Status: DC | PRN
  Administered 2014-03-08: 1000 mL

## 2014-03-08 MED ORDER — METOCLOPRAMIDE HCL 10 MG PO TABS: 5.0000 mg | ORAL_TABLET | Freq: Three times a day (TID) | ORAL | Status: DC | PRN

## 2014-03-08 MED ORDER — AMLODIPINE BESYLATE 5 MG PO TABS
5.0000 mg | ORAL_TABLET | Freq: Every day | ORAL | Status: DC
  Filled 2014-03-08 (×2): qty 1

## 2014-03-08 MED ORDER — CEFAZOLIN SODIUM-DEXTROSE 2-3 GM-% IV SOLR
2.0000 g | Freq: Four times a day (QID) | INTRAVENOUS | Status: AC
  Administered 2014-03-08 – 2014-03-09 (×3): 2 g via INTRAVENOUS
  Filled 2014-03-08 (×4): qty 50

## 2014-03-08 MED ORDER — BUPIVACAINE-EPINEPHRINE 0.25% -1:200000 IJ SOLN
INTRAMUSCULAR | Status: DC | PRN
  Administered 2014-03-08: 10 mL

## 2014-03-08 MED ORDER — PHENYLEPHRINE 40 MCG/ML (10ML) SYRINGE FOR IV PUSH (FOR BLOOD PRESSURE SUPPORT)
PREFILLED_SYRINGE | INTRAVENOUS | Status: AC
  Filled 2014-03-08: qty 10

## 2014-03-08 MED ORDER — PROMETHAZINE HCL 25 MG/ML IJ SOLN: 6.2500 mg | INTRAMUSCULAR | Status: DC | PRN

## 2014-03-08 MED ORDER — MELOXICAM 15 MG PO TABS
15.0000 mg | ORAL_TABLET | Freq: Every day | ORAL | Status: DC
  Administered 2014-03-08 – 2014-03-10 (×3): 15 mg via ORAL
  Filled 2014-03-08 (×3): qty 1

## 2014-03-08 MED ORDER — ACETAMINOPHEN 325 MG PO TABS: 650.0000 mg | ORAL_TABLET | Freq: Four times a day (QID) | ORAL | Status: DC | PRN

## 2014-03-08 MED ORDER — LACTATED RINGERS IV SOLN
INTRAVENOUS | Status: DC | PRN
  Administered 2014-03-08 (×2): via INTRAVENOUS

## 2014-03-08 MED ORDER — ATORVASTATIN CALCIUM 10 MG PO TABS
10.0000 mg | ORAL_TABLET | Freq: Every day | ORAL | Status: DC
  Administered 2014-03-09: 10 mg via ORAL
  Filled 2014-03-08 (×3): qty 1

## 2014-03-08 MED ORDER — DEXAMETHASONE SODIUM PHOSPHATE 4 MG/ML IJ SOLN
INTRAMUSCULAR | Status: DC | PRN
  Administered 2014-03-08: 8 mg via INTRAVENOUS

## 2014-03-08 MED ORDER — METHOCARBAMOL 500 MG PO TABS
500.0000 mg | ORAL_TABLET | Freq: Four times a day (QID) | ORAL | Status: DC | PRN
  Administered 2014-03-09: 500 mg via ORAL
  Filled 2014-03-08: qty 1

## 2014-03-08 MED ORDER — AMLODIPINE-OLMESARTAN 10-40 MG PO TABS: 1.0000 | ORAL_TABLET | Freq: Every day | ORAL | Status: DC

## 2014-03-08 MED ORDER — OXYCODONE-ACETAMINOPHEN 5-325 MG PO TABS: 1.0000 | ORAL_TABLET | ORAL | Status: DC | PRN

## 2014-03-08 MED ORDER — MIDAZOLAM HCL 5 MG/5ML IJ SOLN
INTRAMUSCULAR | Status: DC | PRN
  Administered 2014-03-08: 2 mg via INTRAVENOUS

## 2014-03-08 MED ORDER — PHENOL 1.4 % MT LIQD: 1.0000 | OROMUCOSAL | Status: DC | PRN

## 2014-03-08 MED ORDER — ROCURONIUM BROMIDE 50 MG/5ML IV SOLN
INTRAVENOUS | Status: AC
  Filled 2014-03-08: qty 1

## 2014-03-08 MED ORDER — MENTHOL 3 MG MT LOZG: 1.0000 | LOZENGE | OROMUCOSAL | Status: DC | PRN

## 2014-03-08 MED ORDER — ONDANSETRON HCL 4 MG/2ML IJ SOLN
INTRAMUSCULAR | Status: AC
  Filled 2014-03-08: qty 2

## 2014-03-08 MED ORDER — PANTOPRAZOLE SODIUM 40 MG PO TBEC
40.0000 mg | DELAYED_RELEASE_TABLET | Freq: Every day | ORAL | Status: DC
  Administered 2014-03-09 – 2014-03-10 (×2): 40 mg via ORAL
  Filled 2014-03-08 (×2): qty 1

## 2014-03-08 MED ORDER — ACETAMINOPHEN 650 MG RE SUPP: 650.0000 mg | Freq: Four times a day (QID) | RECTAL | Status: DC | PRN

## 2014-03-08 MED ORDER — HYDROMORPHONE HCL 1 MG/ML IJ SOLN: 0.5000 mg | INTRAMUSCULAR | Status: DC | PRN

## 2014-03-08 MED ORDER — BUPIVACAINE-EPINEPHRINE (PF) 0.5% -1:200000 IJ SOLN
INTRAMUSCULAR | Status: DC | PRN
  Administered 2014-03-08: 30 mL via PERINEURAL

## 2014-03-08 MED ORDER — EPHEDRINE SULFATE 50 MG/ML IJ SOLN
INTRAMUSCULAR | Status: AC
  Filled 2014-03-08: qty 1

## 2014-03-08 MED ORDER — FENTANYL CITRATE 0.05 MG/ML IJ SOLN
INTRAMUSCULAR | Status: AC
  Filled 2014-03-08: qty 5

## 2014-03-08 MED ORDER — METHOCARBAMOL 500 MG PO TABS: 500.0000 mg | ORAL_TABLET | Freq: Three times a day (TID) | ORAL | Status: DC | PRN

## 2014-03-08 MED ORDER — HYDROMORPHONE HCL 1 MG/ML IJ SOLN
0.2500 mg | INTRAMUSCULAR | Status: DC | PRN
  Administered 2014-03-08: 1 mg via INTRAVENOUS

## 2014-03-08 MED ORDER — HYDROMORPHONE HCL 1 MG/ML IJ SOLN: 0.2500 mg | INTRAMUSCULAR | Status: DC | PRN

## 2014-03-08 MED ORDER — SODIUM CHLORIDE 0.9 % IJ SOLN
INTRAMUSCULAR | Status: AC
  Filled 2014-03-08: qty 10

## 2014-03-08 MED ORDER — OMEGA-3-ACID ETHYL ESTERS 1 G PO CAPS
2.0000 g | ORAL_CAPSULE | Freq: Every day | ORAL | Status: DC
  Administered 2014-03-08 – 2014-03-10 (×3): 2 g via ORAL
  Filled 2014-03-08 (×3): qty 2

## 2014-03-08 MED ORDER — HYDROMORPHONE HCL 1 MG/ML IJ SOLN
INTRAMUSCULAR | Status: AC
  Filled 2014-03-08: qty 1

## 2014-03-08 MED ORDER — PROPOFOL 10 MG/ML IV BOLUS
INTRAVENOUS | Status: AC
  Filled 2014-03-08: qty 20

## 2014-03-08 MED ORDER — ROCURONIUM BROMIDE 100 MG/10ML IV SOLN
INTRAVENOUS | Status: DC | PRN
  Administered 2014-03-08: 40 mg via INTRAVENOUS

## 2014-03-08 MED ORDER — METHOCARBAMOL 1000 MG/10ML IJ SOLN
500.0000 mg | Freq: Four times a day (QID) | INTRAVENOUS | Status: DC | PRN
  Filled 2014-03-08: qty 5

## 2014-03-08 MED ORDER — OXYCODONE-ACETAMINOPHEN 5-325 MG PO TABS
1.0000 | ORAL_TABLET | ORAL | Status: DC | PRN
  Administered 2014-03-09: 1 via ORAL
  Administered 2014-03-09: 2 via ORAL
  Administered 2014-03-09 – 2014-03-10 (×4): 1 via ORAL
  Filled 2014-03-08: qty 2
  Filled 2014-03-08 (×5): qty 1

## 2014-03-08 MED ORDER — LIDOCAINE HCL (CARDIAC) 20 MG/ML IV SOLN
INTRAVENOUS | Status: AC
  Filled 2014-03-08: qty 5

## 2014-03-08 MED ORDER — PROPOFOL 10 MG/ML IV BOLUS
INTRAVENOUS | Status: DC | PRN
  Administered 2014-03-08: 150 mg via INTRAVENOUS

## 2014-03-08 MED ORDER — GLYCOPYRROLATE 0.2 MG/ML IJ SOLN
INTRAMUSCULAR | Status: AC
  Filled 2014-03-08: qty 1

## 2014-03-08 MED ORDER — BUPIVACAINE-EPINEPHRINE (PF) 0.25% -1:200000 IJ SOLN
INTRAMUSCULAR | Status: AC
  Filled 2014-03-08: qty 30

## 2014-03-08 MED ORDER — LIDOCAINE HCL (CARDIAC) 20 MG/ML IV SOLN
INTRAVENOUS | Status: DC | PRN
  Administered 2014-03-08: 60 mg via INTRAVENOUS

## 2014-03-08 MED ORDER — DEXAMETHASONE SODIUM PHOSPHATE 4 MG/ML IJ SOLN
INTRAMUSCULAR | Status: AC
  Filled 2014-03-08: qty 2

## 2014-03-08 MED ORDER — ONDANSETRON HCL 4 MG/2ML IJ SOLN
INTRAMUSCULAR | Status: DC | PRN
  Administered 2014-03-08: 4 mg via INTRAVENOUS

## 2014-03-08 MED ORDER — ONDANSETRON HCL 4 MG PO TABS
4.0000 mg | ORAL_TABLET | Freq: Four times a day (QID) | ORAL | Status: DC | PRN
  Administered 2014-03-09 – 2014-03-10 (×4): 4 mg via ORAL
  Filled 2014-03-08 (×4): qty 1

## 2014-03-08 MED ORDER — MIDAZOLAM HCL 2 MG/2ML IJ SOLN
INTRAMUSCULAR | Status: AC
  Filled 2014-03-08: qty 2

## 2014-03-08 MED ORDER — DEXTROSE 5 % IV SOLN
10.0000 mg | INTRAVENOUS | Status: DC | PRN
  Administered 2014-03-08: 25 ug/min via INTRAVENOUS

## 2014-03-08 MED ORDER — METOCLOPRAMIDE HCL 5 MG/ML IJ SOLN: 5.0000 mg | Freq: Three times a day (TID) | INTRAMUSCULAR | Status: DC | PRN

## 2014-03-08 MED ORDER — IRBESARTAN 300 MG PO TABS
300.0000 mg | ORAL_TABLET | Freq: Every day | ORAL | Status: DC
  Administered 2014-03-09 – 2014-03-10 (×2): 300 mg via ORAL
  Filled 2014-03-08 (×2): qty 1

## 2014-03-08 MED ORDER — FENTANYL CITRATE 0.05 MG/ML IJ SOLN
INTRAMUSCULAR | Status: DC | PRN
  Administered 2014-03-08 (×2): 50 ug via INTRAVENOUS

## 2014-03-08 SURGICAL SUPPLY — 65 items
BIT DRILL 170X2.5X (BIT) IMPLANT
BIT DRL 170X2.5X (BIT)
BLADE SAG 18X100X1.27 (BLADE) ×2 IMPLANT
CAPT SHOULD DELTAXTEND CEM MOD ×1 IMPLANT
COVER SURGICAL LIGHT HANDLE (MISCELLANEOUS) ×2 IMPLANT
DRAPE INCISE IOBAN 66X45 STRL (DRAPES) ×6 IMPLANT
DRAPE U-SHAPE 47X51 STRL (DRAPES) ×2 IMPLANT
DRAPE X-RAY CASS 24X20 (DRAPES) IMPLANT
DRILL 2.5 (BIT)
DRSG ADAPTIC 3X8 NADH LF (GAUZE/BANDAGES/DRESSINGS) ×2 IMPLANT
DRSG PAD ABDOMINAL 8X10 ST (GAUZE/BANDAGES/DRESSINGS) ×2 IMPLANT
DURAPREP 26ML APPLICATOR (WOUND CARE) ×2 IMPLANT
ELECT BLADE 4.0 EZ CLEAN MEGAD (MISCELLANEOUS) ×2
ELECT NDL TIP 2.8 STRL (NEEDLE) ×1 IMPLANT
ELECT NEEDLE TIP 2.8 STRL (NEEDLE) ×2 IMPLANT
ELECT REM PT RETURN 9FT ADLT (ELECTROSURGICAL) ×2
ELECTRODE BLDE 4.0 EZ CLN MEGD (MISCELLANEOUS) ×1 IMPLANT
ELECTRODE REM PT RTRN 9FT ADLT (ELECTROSURGICAL) ×1 IMPLANT
GAUZE SPONGE 4X4 12PLY STRL (GAUZE/BANDAGES/DRESSINGS) ×2 IMPLANT
GLOVE BIOGEL PI ORTHO PRO 7.5 (GLOVE) ×1
GLOVE BIOGEL PI ORTHO PRO SZ8 (GLOVE) ×1
GLOVE ORTHO TXT STRL SZ7.5 (GLOVE) ×2 IMPLANT
GLOVE PI ORTHO PRO STRL 7.5 (GLOVE) ×1 IMPLANT
GLOVE PI ORTHO PRO STRL SZ8 (GLOVE) ×1 IMPLANT
GLOVE SURG ORTHO 8.5 STRL (GLOVE) ×2 IMPLANT
GOWN STRL REUS W/ TWL LRG LVL3 (GOWN DISPOSABLE) ×1 IMPLANT
GOWN STRL REUS W/ TWL XL LVL3 (GOWN DISPOSABLE) ×2 IMPLANT
GOWN STRL REUS W/TWL LRG LVL3 (GOWN DISPOSABLE) ×2
GOWN STRL REUS W/TWL XL LVL3 (GOWN DISPOSABLE) ×4
HANDPIECE INTERPULSE COAX TIP (DISPOSABLE)
KIT BASIN OR (CUSTOM PROCEDURE TRAY) ×2 IMPLANT
KIT ROOM TURNOVER OR (KITS) ×2 IMPLANT
MANIFOLD NEPTUNE II (INSTRUMENTS) ×2 IMPLANT
NDL 1/2 CIR MAYO (NEEDLE) ×1 IMPLANT
NDL HYPO 25GX1X1/2 BEV (NEEDLE) ×1 IMPLANT
NEEDLE 1/2 CIR MAYO (NEEDLE) ×2 IMPLANT
NEEDLE HYPO 25GX1X1/2 BEV (NEEDLE) ×2 IMPLANT
NS IRRIG 1000ML POUR BTL (IV SOLUTION) ×2 IMPLANT
PACK SHOULDER (CUSTOM PROCEDURE TRAY) ×2 IMPLANT
PAD ARMBOARD 7.5X6 YLW CONV (MISCELLANEOUS) ×4 IMPLANT
PIN GUIDE 1.2 (PIN) IMPLANT
PIN GUIDE GLENOPHERE 1.5MX300M (PIN) IMPLANT
PIN METAGLENE 2.5 (PIN) IMPLANT
SET HNDPC FAN SPRY TIP SCT (DISPOSABLE) IMPLANT
SLING ARM LRG ADULT FOAM STRAP (SOFTGOODS) IMPLANT
SLING ARM MED ADULT FOAM STRAP (SOFTGOODS) IMPLANT
SPONGE LAP 18X18 X RAY DECT (DISPOSABLE) ×2 IMPLANT
SPONGE LAP 4X18 X RAY DECT (DISPOSABLE) ×2 IMPLANT
STRIP CLOSURE SKIN 1/2X4 (GAUZE/BANDAGES/DRESSINGS) ×2 IMPLANT
SUCTION FRAZIER TIP 10 FR DISP (SUCTIONS) ×2 IMPLANT
SUT FIBERWIRE #2 38 T-5 BLUE (SUTURE) ×10
SUT MNCRL AB 4-0 PS2 18 (SUTURE) ×3 IMPLANT
SUT VIC AB 0 CT1 27 (SUTURE) ×2
SUT VIC AB 0 CT1 27XBRD ANBCTR (SUTURE) IMPLANT
SUT VIC AB 2-0 CT1 27 (SUTURE) ×4
SUT VIC AB 2-0 CT1 TAPERPNT 27 (SUTURE) ×1 IMPLANT
SUT VICRYL 0 CT 1 36IN (SUTURE) ×2 IMPLANT
SUTURE FIBERWR #2 38 T-5 BLUE (SUTURE) ×2 IMPLANT
SYR CONTROL 10ML LL (SYRINGE) ×2 IMPLANT
TOWEL OR 17X24 6PK STRL BLUE (TOWEL DISPOSABLE) ×2 IMPLANT
TOWEL OR 17X26 10 PK STRL BLUE (TOWEL DISPOSABLE) ×2 IMPLANT
TOWER CARTRIDGE SMART MIX (DISPOSABLE) IMPLANT
TRAY FOLEY CATH 16FRSI W/METER (SET/KITS/TRAYS/PACK) ×2 IMPLANT
WATER STERILE IRR 1000ML POUR (IV SOLUTION) ×2 IMPLANT
YANKAUER SUCT BULB TIP NO VENT (SUCTIONS) IMPLANT

## 2014-03-08 NOTE — Anesthesia Postprocedure Evaluation (Signed)
  Anesthesia Post-op Note  Patient: Tara Moreno  Procedure(s) Performed: Procedure(s): RIGHT TOTAL SHOULDER ARTHROPLASTY VERSES A REVERSE ARTHROPLASTY (Right)  Patient Location: PACU  Anesthesia Type:GA combined with regional for post-op pain  Level of Consciousness: awake, alert  and oriented  Airway and Oxygen Therapy: Patient Spontanous Breathing  Post-op Pain: none  Post-op Assessment: Post-op Vital signs reviewed  Post-op Vital Signs: Reviewed  Last Vitals:  Filed Vitals:   03/08/14 1215  BP: 112/74  Pulse: 76  Temp: 36.6 C  Resp: 15    Complications: No apparent anesthesia complications

## 2014-03-08 NOTE — Discharge Instructions (Signed)
Ok to use the shoulder and arm for light activity.  No pushing, pulling, or lifting.  Use the sling out of the house, ok to remove in the house as you feel comfortable.  Keep the incision clean and dry for one week, then ok to get wet in the shower.  Can change bandage after three days, keep covered with a dry bandage or Bandaids for one week.  Ice to the shoulder constantly.  Follow up with Dr Veverly Fells in two weeks  [phone]

## 2014-03-08 NOTE — Brief Op Note (Signed)
03/08/2014  10:13 AM  PATIENT:  Tara Moreno  68 y.o. female  PRE-OPERATIVE DIAGNOSIS:  RIGHT SHOULDER OA, ROTATOR CUFF INSUFFICIENCY, LARGE SUBMUSCULAR LIPOMA  POST-OPERATIVE DIAGNOSIS:  RIGHT SHOULDER OA,  ROTATOR CUFF INSUFFICIENCY, LARGE SUBMUSCULAR LIPOMA   PROCEDURE:  Procedure(s): RIGHT TOTAL SHOULDER ARTHROPLASTY VERSES A REVERSE ARTHROPLASTY (Right) DePuy Delta Xtend,  Excisional Biopsy (removal of) Right shoulder submuscular lipoma  SURGEON:  Surgeon(s) and Role:    * Augustin Schooling, MD - Primary  PHYSICIAN ASSISTANT:   ASSISTANTS: Ventura Bruns, PA-C   ANESTHESIA:   regional and general  EBL:  Total I/O In: 1250 [I.V.:1250] Out: -   BLOOD ADMINISTERED:none  DRAINS: none   LOCAL MEDICATIONS USED:  MARCAINE     SPECIMEN:  Large Submuscular Mass Right shoulder  DISPOSITION OF SPECIMEN:  PATHOLOGY  COUNTS:  YES  TOURNIQUET:  * No tourniquets in log *  DICTATION: .Other Dictation: Dictation Number [id number]  PLAN OF CARE: Admit to inpatient   PATIENT DISPOSITION:  PACU - hemodynamically stable.   Delay start of Pharmacological VTE agent (>24hrs) due to surgical blood loss or risk of bleeding: not applicable

## 2014-03-08 NOTE — Op Note (Signed)
Tara Moreno, Tara Moreno              ACCOUNT NO.:  [id number]  MEDICAL RECORD NO.:  [id number]  LOCATION:  [facility]                         FACILITY:  [facility]  PHYSICIAN:  Doran Heater. Veverly Fells, M.D. DATE OF BIRTH:  07-Oct-1945  DATE OF PROCEDURE:  03/08/2014 DATE OF DISCHARGE:                              OPERATIVE REPORT   PREOPERATIVE DIAGNOSIS:  Right shoulder end-stage arthritis with rotator cuff insufficiency as well as a large submuscular lipoma.  POSTOPERATIVE DIAGNOSIS:  Right shoulder end-stage arthritis with rotator cuff insufficiency as well as a large submuscular lipoma.  PROCEDURE PERFORMED:  Right shoulder reverse total shoulder arthroplasty using DePuy Delta Xtend prosthesis as well as removal/excisional biopsy of right submuscular mass.  ATTENDING SURGEON:  Doran Heater. Veverly Fells, MD.  ASSISTANT:  Abbott Pao. Dixon, PA-C, who was scrubbed the entire procedure and necessary for satisfactory completion of surgery.  General anesthesia was used plus interscalene block.  ESTIMATED BLOOD LOSS:  Minimal.  FLUID REPLACEMENT:  1500 mL crystalloids.  INSTRUMENT COUNTS:  Correct.  There were no complications.  ANTIBIOTICS:  Perioperative antibiotics were given.  INDICATIONS:  The patient is a 67 year old female with worsening right shoulder pain and long-standing dysfunction to the shoulder and a large mass on MRI scan submuscular consistent with a lipoma.  The patient has had increasing pain and diminishing function to the point where she really cannot use her shoulder at all and is her dominant shoulder.  We discussed options for management issue with the patient given the long- standing rotator cuff dysfunction, poor range of motion, extreme pain, and she is an excellent candidate for reverse total shoulder arthroplasty as well as removal of a large submuscular lipoma which is deep to the deltoid and superficial to the teres minor and infraspinatus.  The patient agreed.  Informed  consent obtained.  DESCRIPTION OF PROCEDURE:  After an adequate level of anesthesia achieved, the patient was positioned in the modified beach-chair position.  Right shoulder correctly identified, sterilely prepped and draped in usual manner.  Time-out called.  We entered the shoulder in standard and anterior deltopectoral incision starting coracoid process extending down to the anterior humerus with a #10 blade scalpel. Dissection down through the subcutaneous tissues.  Using the Bovie, we identified cephalic vein, took it laterally with the deltoid pectoralis taken medially.  We identified the conjoined tendon, retracted that medially, placed our deep retractors.  We next went ahead and released the subscapularis subperiosteally off the lesser tuberosity and placed #2 FiberWire suture in a modified Mason-Allen suture technique into the free-end of the tendon for repair at the end of the case.  Progressive release of the inferior capsule.  We noted there to be some partial thickness tearing in the supraspinatus tendon.  No extrusion of that tendon whatsoever.  We released the supraspinatus and infraspinatus tendons and then delivered the humerus out of the wound.  We placed the intramedullary reamers in the proximal humerus up to a size 10 and then did our metaphyseal reaming for an epi 1 right.  We then placed our trial prosthesis, subluxed the humerus posteriorly.  We were able to identify the large lipoma by visualization and also by palpation posteriorly and removed  that using Cobb elevator, and adjusted digitally.  It was approximately the size of a fist very large but well encapsulating completely benign appearing.  We sent that for final pathology in formalin.  We next went ahead and prepared our glenoid face.  We removed the capsule off the back side of the subscapularis and also off the inferior part of the capsule.  Large loose bodies were identified.  Probably about 6-8 of  those removed at least 4 mm in diameter.  We then went ahead and found our center point on the glenoid face, drilled that with a guide pin and reamed for the metaglene.  Next, we used our peripheral hand reamer and then placed, impacted our real metaglene into place after drilling out the central peg hole.  With HA coated metaglene in position, we placed a 30 lock screw inferiorly, a 24 lock at the base of the coracoid and then a 18 lock posteriorly for excellent purchase and stability.  We then placed our Nitinol wire and then the 38 standard glenoid sphere in position protecting the axillary nerve.  We then reduced the shoulder with 38+ 6 polyethylene component. We were pleased with soft tissue balancing.  No gap or sulcus.  No gapping with external rotation and nice tight conjoined.  We redislocated the shoulder, removed the trial prosthesis and drill holes, and placed #2 FiberWire suture for repair of the subscapularis, then with impaction grafting technique.  After irrigation, we impacted the size 10 HA coated stem with an epi 1 right metaphysis, set on 0 degrees on the prosthesis, but placed in 10 degrees of retroversion.  Once that was impacted securely into position and stable, we then went ahead and placed the 38+ 6 trial again, reduced the shoulder and pleased with that, so we selected a real 38+ 6 polyethylene insert, impacted that in position, reduced the shoulder.  Again, nice stability in all planes. We then went ahead and freed up the rotator cuff with a Cobb elevator and placed #2 FiberWire suture into that rotator cuff tendon repairing that back to greater tuberosity and also repairing subscapularis back anatomically the bone, placing a suture in the rotator interval.  We felt that the additional soft tissue and rotator cuff repair may give better screw strength and function in the shoulder without tethering the shoulder whatsoever.  At this point, we irrigated thoroughly,  repaired. We did do a biceps tenodesis with the suture coming through the drill holes in the bone and then repaired the deltopectoral interval with 0 Vicryl suture, followed by 2-0 Vicryl subcutaneous closure and 4-0 Monocryl for skin.  Steri-Strips applied followed by sterile dressing. The patient tolerated the surgery well.     Doran Heater. Veverly Fells, M.D.     SRN/MEDQ  D:  03/08/2014  T:  03/08/2014  Job:  [id number]

## 2014-03-08 NOTE — Progress Notes (Signed)
Orthopedics Progress Note  Subjective: Patient is comfortable so far with the right shoulder.  She has had more pain today on the left side.  Objective:  Filed Vitals:   03/08/14 1500  BP: 131/66  Pulse: 81  Temp: 97.5 F (36.4 C)  Resp: 16    General: Awake and alert  Musculoskeletal: right shoulder dressing clean and dry and intact. Moving hand well Neurovascularly intact  Lab Results  Component Value Date   WBC 8.3 02/22/2014   HGB 13.7 02/22/2014   HCT 41.0 02/22/2014   MCV 89.5 02/22/2014   PLT 257 02/22/2014       Component Value Date/Time   NA 136* 02/22/2014 1133   K 5.3 02/22/2014 1133   CL 99 02/22/2014 1133   CO2 21 02/22/2014 1133   GLUCOSE 96 02/22/2014 1133   BUN 26* 02/22/2014 1133   CREATININE 0.68 02/22/2014 1133   CALCIUM 9.9 02/22/2014 1133   GFRNONAA 89* 02/22/2014 1133   GFRAA >90 02/22/2014 1133    No results found for this basename: INR, PROTIME    Assessment/Plan: Post Op check s/p Procedure(s): RIGHT TOTAL SHOULDER ARTHROPLASTY VERSES A REVERSE ARTHROPLASTY Post op XRAYs look good. Plan OT and possible D/C tomorrow  Remo Lipps R. Veverly Fells, MD 03/08/2014 3:44 PM

## 2014-03-08 NOTE — OR Nursing (Signed)
Time documented at 0735 is  Incorrect. Correct timeout  Was preformed at  0806.Corrected by documentation of Charlie Seda time out screen

## 2014-03-08 NOTE — Progress Notes (Signed)
Utilization review completed.  

## 2014-03-08 NOTE — Transfer of Care (Signed)
Immediate Anesthesia Transfer of Care Note  Patient: Tara Moreno  Procedure(s) Performed: Procedure(s): RIGHT TOTAL SHOULDER ARTHROPLASTY VERSES A REVERSE ARTHROPLASTY (Right)  Patient Location: PACU  Anesthesia Type:GA combined with regional for post-op pain  Level of Consciousness: awake, alert  and oriented  Airway & Oxygen Therapy: Patient Spontanous Breathing and Patient connected to nasal cannula oxygen  Post-op Assessment: Report given to PACU RN, Post -op Vital signs reviewed and stable and Patient moving all extremities  Post vital signs: Reviewed and stable  Complications: No apparent anesthesia complications

## 2014-03-08 NOTE — Anesthesia Preprocedure Evaluation (Signed)
Anesthesia Evaluation  Patient identified by MRN, date of birth, ID band Patient awake    Reviewed: Allergy & Precautions, H&P , NPO status , Patient's Chart, lab work & pertinent test results  History of Anesthesia Complications (+) PONV  Airway Mallampati: I  TM Distance: >3 FB Neck ROM: Full    Dental   Pulmonary          Cardiovascular hypertension, Pt. on medications     Neuro/Psych    GI/Hepatic GERD-  Medicated and Controlled,  Endo/Other    Renal/GU      Musculoskeletal   Abdominal   Peds  Hematology   Anesthesia Other Findings   Reproductive/Obstetrics                             Anesthesia Physical Anesthesia Plan  ASA: II  Anesthesia Plan: General   Post-op Pain Management:    Induction: Intravenous  Airway Management Planned: Oral ETT  Additional Equipment:   Intra-op Plan:   Post-operative Plan: Extubation in OR  Informed Consent: I have reviewed the patients History and Physical, chart, labs and discussed the procedure including the risks, benefits and alternatives for the proposed anesthesia with the patient or authorized representative who has indicated his/her understanding and acceptance.     Plan Discussed with: CRNA and Surgeon  Anesthesia Plan Comments:         Anesthesia Quick Evaluation

## 2014-03-08 NOTE — Interval H&P Note (Signed)
History and Physical Interval Note:  03/08/2014 10:08 AM  Tara Moreno  has presented today for surgery, with the diagnosis of RIGHT SHOULDER OA  The various methods of treatment have been discussed with the patient and family. After consideration of risks, benefits and other options for treatment, the patient has consented to  Procedure(s): RIGHT TOTAL SHOULDER ARTHROPLASTY VERSES A REVERSE ARTHROPLASTY (Right) as a surgical intervention .  The patient's history has been reviewed, patient examined, no change in status, stable for surgery.  I have reviewed the patient's chart and labs.  Questions were answered to the patient's satisfaction.     Inette Doubrava,STEVEN R

## 2014-03-08 NOTE — Anesthesia Procedure Notes (Addendum)
Anesthesia Regional Block:  Interscalene brachial plexus block  Pre-Anesthetic Checklist: ,, timeout performed, Correct Patient, Correct Site, Correct Laterality, Correct Procedure, Correct Position, site marked, Risks and benefits discussed,  Surgical consent,  Pre-op evaluation,  At surgeon's request and post-op pain management  Laterality: Right  Prep: chloraprep       Needles:  Injection technique: Single-shot  Needle Type: Echogenic Stimulator Needle     Needle Length: 9cm 9 cm Needle Gauge: 21 and 21 G    Additional Needles:  Procedures: ultrasound guided (picture in chart) and nerve stimulator Interscalene brachial plexus block  Nerve Stimulator or Paresthesia:  Response: 0.4 mA,   Additional Responses:   Narrative:  Start time: 03/08/2014 7:05 AM End time: 03/08/2014 7:20 AM Injection made incrementally with aspirations every 5 mL. Anesthesiologist: Lillia Abed MD  Additional Notes: Monitors applied. Patient sedated. Sterile prep and drape,hand hygiene and sterile gloves were used. Relevant anatomy identified.Needle position confirmed.Local anesthetic injected incrementally after negative aspiration. Local anesthetic spread visualized around nerve(s). Vascular puncture avoided. No complications. Image printed for medical record.The patient tolerated the procedure well.        Procedure Name: Intubation Date/Time: 03/08/2014 7:43 AM Performed by: Jenne Campus Pre-anesthesia Checklist: Patient identified, Emergency Drugs available, Suction available, Patient being monitored and Timeout performed Patient Re-evaluated:Patient Re-evaluated prior to inductionOxygen Delivery Method: Circle system utilized Preoxygenation: Pre-oxygenation with 100% oxygen Intubation Type: IV induction Ventilation: Mask ventilation without difficulty Laryngoscope Size: Miller and 2 Grade View: Grade II Tube type: Oral Tube size: 7.0 mm Number of attempts: 1 Airway Equipment and  Method: Stylet Placement Confirmation: ETT inserted through vocal cords under direct vision,  positive ETCO2,  CO2 detector and breath sounds checked- equal and bilateral Secured at: 21 cm Tube secured with: Tape Dental Injury: Teeth and Oropharynx as per pre-operative assessment

## 2014-03-09 LAB — BASIC METABOLIC PANEL WITH GFR
Anion gap: 14 (ref 5–15)
BUN: 21 mg/dL (ref 6–23)
CO2: 25 meq/L (ref 19–32)
Calcium: 9 mg/dL (ref 8.4–10.5)
Chloride: 96 meq/L (ref 96–112)
Creatinine, Ser: 0.82 mg/dL (ref 0.50–1.10)
GFR calc Af Amer: 85 mL/min — ABNORMAL LOW
GFR calc non Af Amer: 73 mL/min — ABNORMAL LOW
Glucose, Bld: 123 mg/dL — ABNORMAL HIGH (ref 70–99)
Potassium: 3.9 meq/L (ref 3.7–5.3)
Sodium: 135 meq/L — ABNORMAL LOW (ref 137–147)

## 2014-03-09 LAB — GLUCOSE, CAPILLARY: Glucose-Capillary: 117 mg/dL — ABNORMAL HIGH (ref 70–99)

## 2014-03-09 LAB — HEMOGLOBIN AND HEMATOCRIT, BLOOD
HEMATOCRIT: 36 % (ref 36.0–46.0)
HEMOGLOBIN: 12.1 g/dL (ref 12.0–15.0)

## 2014-03-09 MED ORDER — LORAZEPAM 1 MG PO TABS
1.0000 mg | ORAL_TABLET | Freq: Four times a day (QID) | ORAL | Status: DC | PRN
  Administered 2014-03-09: 1 mg via ORAL
  Filled 2014-03-09: qty 1

## 2014-03-09 NOTE — Progress Notes (Signed)
Patient c/o of nausea. Moderate amount of vomiting. Zofran given at 0900. Patient states nausea improved at 0920 but now c/o headache and lightheadedness. Patient also diaphoretic. BP 112/63 at this time, O2 89, Pulse 72. Patient restarted on 2L O2. At 1000 patient complains of chest pain. Rapid response nurse on the floor at the time and made aware. BP rechecked, 129/84. Patient assessed by rapid response nurse, and patient states chest pain slightly relieved. Will continue to monitor

## 2014-03-09 NOTE — Progress Notes (Signed)
PA on call made aware of patients chest pain and anxiety. Orders placed for 1mg  po Ativan. Patient states chest pain relieved and nausea much better at this time. Will continue to monitor.

## 2014-03-09 NOTE — Progress Notes (Signed)
Occupational Therapy Evaluation Patient Details Name: Tara Moreno MRN: [id number] DOB: 18-Feb-1946 Today's Date: 03/09/2014    History of Present Illness Tara Moreno is a 68 y.o. Female s/p Rt Reverse TSA on 03/08/14. PMH of HTN, arthritis, PONV, and GERD.    Clinical Impression   PTA pt lived at home with spouse and was independent with ADLs. Pt reports that her husband has dementia and she has to care for him at times. Pt currently limited by RUE pain and ROM restrictions which prevent her from being independent with ADLs. During OT session, pt became diaphoretic and nonresponsive and RN notified. Pt aroused briefly after, however OT session ended at this time. D/C recommendations to be determined based on pt progress with pain and medications and physical assist needed. Pt will benefit from continued acute OT to address UB ADLs and therapeutic exercise.      Follow Up Recommendations  Other (comment);Supervision/Assistance - 24 hour (TBD during next session)    Equipment Recommendations  Other (comment) (TBD)    Recommendations for Other Services       Precautions / Restrictions Precautions Precautions: Shoulder;Fall Type of Shoulder Precautions: Norris reverse protocol (AAROM of shoulder FF, ER/IR and elbow,wrist,hand and pendulums) Shoulder Interventions: Shoulder sling/immobilizer;For comfort Precaution Booklet Issued: Yes (comment) Precaution Comments: Educated pt on shoulder precautions. Required Braces or Orthoses: Sling Restrictions Weight Bearing Restrictions: Yes RUE Weight Bearing: Non weight bearing      Mobility Bed Mobility Overal bed mobility: Needs Assistance Bed Mobility: Supine to Sit     Supine to sit: Supervision;HOB elevated     General bed mobility comments: Pt able to move to EOB with no assistance and Supervision for RUE NWB.   Transfers Overall transfer level: Needs assistance Equipment used: 1 person hand held assist Transfers:  Sit to/from [facility] Sit to Stand: Min assist Stand pivot transfers: Min assist       General transfer comment: Pt with minimal balance deficits likely related to medications.          ADL Overall ADL's : Needs assistance/impaired Eating/Feeding: Sitting;Set up Eating/Feeding Details (indicate cue type and reason): pt requires assist to open containers and cut food Grooming: Set up;Sitting   Upper Body Bathing: Moderate assistance;Sitting   Lower Body Bathing: Minimal assistance;Cueing for safety;Sit to/from stand   Upper Body Dressing : Maximal assistance;Sitting Upper Body Dressing Details (indicate cue type and reason): including sling Lower Body Dressing: Moderate assistance;Sit to/from stand   Toilet Transfer: Minimal assistance;Stand-pivot (bed>recliner)             General ADL Comments: Pt was alert during therapeutic exercise training and completed sitting in recliner. Pt stated that she needed to use the toilet and prepared to complete stand pivot transfer, however upon standing pt immediately sat back down with reports of nausea. Pt rocked in chair briefly, then reclined in chair making groaning sounds. Pt was not responsive to sternal rub or calling her name, became incontinent of urine, and OT alerted RN. See vitals tab. Pt came to shortly after and resumed performing forward flexion exercise.Pt was able to state where she was and ner name upon arousal.      Vision  Pt reports no change from baseline.                    Perception Perception Perception Tested?: No   Praxis Praxis Praxis tested?: Within functional limits    Pertinent Vitals/Pain Pain Assessment: 0-10 Pain Score: 8  Pain Location: Rt shoulder Pain Descriptors / Indicators: Aching Pain Intervention(s): Limited activity within patient's tolerance;Monitored during session;Repositioned;Ice applied     Hand Dominance Right   Extremity/Trunk Assessment Upper  Extremity Assessment Upper Extremity Assessment: RUE deficits/detail RUE Deficits / Details: No shoulder AROM.  RUE: Unable to fully assess due to pain;Unable to fully assess due to immobilization RUE Coordination: decreased gross motor;decreased fine motor   Lower Extremity Assessment Lower Extremity Assessment: Overall WFL for tasks assessed   Cervical / Trunk Assessment Cervical / Trunk Assessment: Normal   Communication Communication Communication: No difficulties   Cognition Arousal/Alertness: Awake/alert;Lethargic Behavior During Therapy: WFL for tasks assessed/performed Overall Cognitive Status: Within Functional Limits for tasks assessed                        Exercises Exercises: Shoulder     03/09/14 1300  Shoulder Exercises  Pendulum Exercise (pt to be educated)  Shoulder Flexion AAROM;Right;10 reps;Supine (in recliner)  Shoulder Extension AAROM;Right;10 reps;Seated (in recliner)  Shoulder External Rotation AAROM;Right;10 reps;Supine (in recliner)  Elbow Flexion AROM;Right;10 reps;Supine (in recliner)  Elbow Extension AROM;Right;10 reps;Supine (in recliner)  Wrist Flexion AROM;Right;10 reps;Seated  Wrist Extension AROM;Right;10 reps;Seated  Digit Composite Flexion AROM;Right;10 reps;Seated  Composite Extension AROM;Right;10 reps;Seated      Shoulder Instructions  not completed at this time due to pt becoming diaphoretic and unresponsive. Will address during next session.     Home Living Family/patient expects to be discharged to:: Private residence Living Arrangements: Spouse/significant other Available Help at Discharge: Family;Available PRN/intermittently (pt's husband has dementia and sons plan to assist)                             Additional Comments: Pt became diaphoretic and nonresponsive briefly during OT session and RN called. Home environment questions to be obtained further during next session.      Prior  Functioning/Environment Level of Independence: Independent             OT Diagnosis: Generalized weakness;Acute pain   OT Problem List: Decreased strength;Decreased range of motion;Decreased activity tolerance;Impaired balance (sitting and/or standing);Decreased safety awareness;Decreased knowledge of use of DME or AE;Decreased knowledge of precautions;Impaired UE functional use;Pain   OT Treatment/Interventions: Self-care/ADL training;Therapeutic exercise;Energy conservation;DME and/or AE instruction;Therapeutic activities;Patient/family education;Balance training    OT Goals(Current goals can be found in the care plan section) Acute Rehab OT Goals Patient Stated Goal: to do what the doctor says OT Goal Formulation: With patient Time For Goal Achievement: 03/23/14 Potential to Achieve Goals: Good ADL Goals Pt Will Perform Grooming: with set-up;with supervision;standing Pt Will Perform Upper Body Bathing: with set-up;with supervision;sitting Pt Will Perform Upper Body Dressing: with min assist;sitting Pt Will Transfer to Toilet: with supervision;stand pivot transfer;bedside commode Pt Will Perform Toileting - Clothing Manipulation and hygiene: with supervision;sit to/from stand Pt/caregiver will Perform Home Exercise Program: Increased ROM;Right Upper extremity;Independently;With written HEP provided  OT Frequency: Min 3X/week              End of Session Equipment Utilized During Treatment: Other (comment) (sling, dowel (shoe horn)) Nurse Communication: Other (comment) (chair alarm in recliner with pt; alerted RN when pt became u)  Activity Tolerance: Treatment limited secondary to medical complications (Comment) (pt became unresponsive and diaphoretic during OT session) Patient left: in chair;with call bell/phone within reach;with nursing/sitter in room   Time: 1155-1230 OT Time Calculation (min): 35 min Charges:  OT General Charges $OT  Visit: 1 Procedure OT  Evaluation $Initial OT Evaluation Tier I: 1 Procedure OT Treatments $Self Care/Home Management : 8-22 mins $Therapeutic Exercise: 8-22 mins  Villa Herb M 03/09/2014, 2:12 PM  Cyndie Chime, OTR/L Occupational Therapist [phone] (pager)

## 2014-03-09 NOTE — Significant Event (Addendum)
Rapid Response Event Note  Overview:  Called to see patient for complaints of chest pain.    Initial Focused Assessment: Upon arrival patient is alert, conversant, slightly diaphoretic. BP and HR stable.  Complains of R shoulder pain--post Right shoulder surgery yesterday, states 'nerve block is wearing off.' 8Am percocet was given for shoulder pain, patient had episode of nausea accompanied by vomiting at the onset of this episode. Chest pain is described as feeling like a 'pill got stuck,' located at the 8th intercostal space, right sternal border.   While conversing, patient's pain subsided, diaphoresis stopped and BP rose slightly to within her normal range.  Interventions: Conversed with nurse about pain medication. Dilaudid is ordered for breakthrough pain, but concerned about effects on nausea and vomiting as patient has not taken it yet. Also discussed potential use of something for anxiety to help her relax and make scheduled pain medicine more effective.   RN to confer with MD regarding above.  Of note, there is considerable family stress, as husband and son are just now attending funeral of a niece who had an untimely and unexpected death this past week.  Event Summary:   Patient remained in room, symptoms resolved at  1036.    Follow up at  1440. Patient stable, no further complaints of chest pain.        Baron Hamper

## 2014-03-09 NOTE — Progress Notes (Signed)
    Subjective: 1 Day Post-Op Procedure(s) (LRB): RIGHT TOTAL SHOULDER ARTHROPLASTY VERSES A REVERSE ARTHROPLASTY (Right) Patient reports pain as 5 on 0-10 scale.   Denies CP or SOB.  Voiding without difficulty. Positive flatus. Objective: Vital signs in last 24 hours: Temp:  [97.5 F (36.4 C)-98.7 F (37.1 C)] 98.7 F (37.1 C) (10/31 0552) Pulse Rate:  [72-91] 78 (10/31 0552) Resp:  [11-21] 16 (10/31 0552) BP: (92-140)/(60-77) 129/77 mmHg (10/31 0552) SpO2:  [88 %-99 %] 98 % (10/31 0552) Weight:  [111.9 kg (246 lb 11.1 oz)] 111.9 kg (246 lb 11.1 oz) (10/30 1115)  Intake/Output from previous day: 10/30 0701 - 10/31 0700 In: 1490 [P.O.:240; I.V.:1250] Out: -  Intake/Output this shift:    Labs: No results found for this basename: HGB,  in the last 72 hours No results found for this basename: WBC, RBC, HCT, PLT,  in the last 72 hours No results found for this basename: NA, K, CL, CO2, BUN, CREATININE, GLUCOSE, CALCIUM,  in the last 72 hours No results found for this basename: LABPT, INR,  in the last 72 hours  Physical Exam: Neurologically intact ABD soft Intact pulses distally Compartment soft  Assessment/Plan: 1 Day Post-Op Procedure(s) (LRB): RIGHT TOTAL SHOULDER ARTHROPLASTY VERSES A REVERSE ARTHROPLASTY (Right) Advance diet Up with therapy Possible d/c Sunday  Ronae Noell D for Dr. Melina [facility] [phone] 03/09/2014, 8:51 AM

## 2014-03-09 NOTE — Evaluation (Signed)
Physical Therapy Evaluation Patient Details Name: Tara Moreno MRN: [id number] DOB: 16-Aug-1945 Today's Date: 03/09/2014   History of Present Illness  Tara Moreno is a 68 y.o. retired female s/p Rt Reverse TSA on 03/08/14. PMH of HTN, arthritis, and GERD.   Clinical Impression  Patient presents with limited use of Rt UE, thereby affecting her general mobility skills. She needed assistance during bed mobility, transfers and gait secondary to inability to utilize Rt UE. She appeared unsteady during gait.She is motivated to regain independence and to engage in therapy as she is primary caregiver for spouse who has dementia.    Follow Up Recommendations Home health PT;Outpatient PT (depending on rate of mobility progression during acute stay)    Equipment Recommendations  None recommended by PT    Recommendations for Other Services       Precautions / Restrictions Precautions Precautions: Shoulder;Fall Type of Shoulder Precautions: Norris reverse protocol (AAROM of shoulder FF, ER/IR and elbow,wrist,hand and pendulums) Shoulder Interventions: Shoulder sling/immobilizer;For comfort Precaution Booklet Issued: Yes (comment) Precaution Comments: OT previously issued Required Braces or Orthoses: Sling Restrictions Weight Bearing Restrictions: Yes RUE Weight Bearing: Non weight bearing      Mobility  Bed Mobility Overal bed mobility: Needs Assistance Bed Mobility: Supine to Sit     Supine to sit: Min guard;HOB elevated     General bed mobility comments: Pt able to move to EOB with no assistance and Supervision for RUE NWB.   Transfers Overall transfer level: Needs assistance Equipment used: 1 person hand held assist Transfers: Sit to/from Stand Sit to Stand: Min guard Stand pivot transfers: Min assist       General transfer comment: no dizziness upon standing  Ambulation/Gait Ambulation/Gait assistance: Min guard Ambulation Distance (Feet): 350 Feet (post  exercise HR 120 bpm) Assistive device: None Gait Pattern/deviations: Shuffle (2 standing rest breaks)     General Gait Details: wore sling during gait, followed by chair for safety  Stairs            Wheelchair Mobility    Modified Rankin (Stroke Patients Only)       Balance Overall balance assessment: Needs assistance Sitting-balance support: No upper extremity supported Sitting balance-Leahy Scale: Fair Sitting balance - Comments: not tested>fair grade   Standing balance support: No upper extremity supported Standing balance-Leahy Scale: Fair Standing balance comment: not tested >fair grade, few stumbles to acclimate to mobility after surgery                             Pertinent Vitals/Pain Pain Assessment: 0-10 Pain Score: 4  Pain Location: Rt shoulder Pain Descriptors / Indicators: Constant;Aching Pain Intervention(s): Limited activity within patient's tolerance;Monitored during session;Repositioned;RN gave pain meds during session    [place] expects to be discharged to:: Private residence Living Arrangements: Spouse/significant other (husband has dementia, son from [place] short term) Available Help at Discharge: Family;Friend(s);Available PRN/intermittently Type of Home: House Home Access: Stairs to enter Entrance Stairs-Rails: Can reach both Entrance Stairs-Number of Steps: 2 Home Layout: One level Home Equipment: None Additional Comments: patient reported feeling better than earlier today    Prior Function Level of Independence: Independent               Hand Dominance   Dominant Hand: Right    Extremity/Trunk Assessment   Upper Extremity Assessment: Defer to OT evaluation RUE Deficits / Details: No shoulder AROM.  RUE: Unable to fully assess due  to pain;Unable to fully assess due to immobilization       Lower Extremity Assessment: Overall WFL for tasks assessed      Cervical / Trunk Assessment:  Normal  Communication   Communication: No difficulties  Cognition Arousal/Alertness: Awake/alert Behavior During Therapy: WFL for tasks assessed/performed Overall Cognitive Status: Within Functional Limits for tasks assessed                      General Comments      Exercises Shoulder Exercises Pendulum Exercise:  (pt to be educated) Shoulder Flexion: AAROM;Right;10 reps;Supine (in recliner) Shoulder Extension: AAROM;Right;10 reps;Seated (in recliner) Shoulder External Rotation: AAROM;Right;10 reps;Supine (in recliner) Elbow Flexion: AROM;Right;10 reps;Supine (in recliner) Elbow Extension: AROM;Right;10 reps;Supine (in recliner) Wrist Flexion: AROM;Right;10 reps;Seated Wrist Extension: AROM;Right;10 reps;Seated Digit Composite Flexion: AROM;Right;10 reps;Seated Composite Extension: AROM;Right;10 reps;Seated Donning/doffing sling/immobilizer: Moderate assistance      Assessment/Plan    PT Assessment Patient needs continued PT services  PT Diagnosis Difficulty walking   PT Problem List Decreased strength;Decreased range of motion;Decreased activity tolerance;Decreased balance;Decreased mobility;Decreased knowledge of precautions;Pain  PT Treatment Interventions Gait training;Stair training;Functional mobility training;Therapeutic activities;Therapeutic exercise;Balance training;Patient/family education   PT Goals (Current goals can be found in the Care Plan section) Acute Rehab PT Goals Patient Stated Goal: gain ability to use Rt UE PT Goal Formulation: With patient Time For Goal Achievement: 03/13/14 Potential to Achieve Goals: Good    Frequency Min 3X/week   Barriers to discharge        Co-evaluation               End of Session Equipment Utilized During Treatment: Gait belt Activity Tolerance: Patient tolerated treatment well Patient left: in chair;with call bell/phone within reach;with nursing/sitter in room Nurse Communication: Mobility status          Time: 1538-1610 PT Time Calculation (min): 32 min   Charges:   PT Evaluation $Initial PT Evaluation Tier I: 1 Procedure PT Treatments $Gait Training: 8-22 mins   PT G Codes:          Seraj Dunnam 2014-03-13, 4:33 PM Malka So, [place]

## 2014-03-10 NOTE — Progress Notes (Signed)
Physical Therapy Treatment Patient Details Name: Tara Moreno MRN: [id number] DOB: February 09, 1946 Today's Date: 03/10/2014    History of Present Illness Tara Moreno is a 68 y.o. retired female s/p Rt Reverse TSA on 03/08/14. PMH of HTN, arthritis, and GERD.     PT Comments    Patient at mod I level with mobility and gait.  Good balance with gait.  Will need f/u HHPT for shoulder and advanced balance.  Patient ready for d/c from PT perspective.  Follow Up Recommendations  Home health PT;Supervision - Intermittent     Equipment Recommendations  None recommended by PT    Recommendations for Other Services       Precautions / Restrictions Precautions Precautions: Shoulder;Fall Type of Shoulder Precautions: Norris reverse protocol (AAROM of shoulder FF, ER/IR and elbow,wrist,hand and pendulums) Shoulder Interventions: Shoulder sling/immobilizer;For comfort Required Braces or Orthoses: Sling Restrictions Weight Bearing Restrictions: Yes RUE Weight Bearing: Non weight bearing    Mobility  Bed Mobility Overal bed mobility: Modified Independent Bed Mobility: Supine to Sit;Sit to Supine     Supine to sit: Modified independent (Device/Increase time) Sit to supine: Modified independent (Device/Increase time)   General bed mobility comments: Removed rail.  Patient able to move supine <> sit with increased time and no assist.  Did require min assist to don/adjust sling.  Transfers Overall transfer level: Modified independent Equipment used: None Transfers: Sit to/from Stand Sit to Stand: Modified independent (Device/Increase time)         General transfer comment: Increased time.  Good balance in standing  Ambulation/Gait Ambulation/Gait assistance: Modified independent (Device/Increase time) Ambulation Distance (Feet): 300 Feet Assistive device: None Gait Pattern/deviations: Step-through pattern;Decreased stride length Gait velocity: Decreased Gait velocity  interpretation: Below normal speed for age/gender General Gait Details: Patient with slow, guarded gait.  Decreased rotation due to RUE in sling.  Good balance with gait, including during head turns, stops, and turns.   Stairs            Wheelchair Mobility    Modified Rankin (Stroke Patients Only)       Balance           Standing balance support: No upper extremity supported Standing balance-Leahy Scale: Good               High level balance activites: Direction changes;Turns;Sudden stops;Head turns High Level Balance Comments: No loss of balance with high level activities    Cognition Arousal/Alertness: Awake/alert Behavior During Therapy: WFL for tasks assessed/performed Overall Cognitive Status: Within Functional Limits for tasks assessed                      Exercises      General Comments        Pertinent Vitals/Pain Pain Assessment: 0-10 Pain Score: 3  Pain Location: Rt shoulder Pain Descriptors / Indicators: Sore Pain Intervention(s): Limited activity within patient's tolerance    Home Living                      Prior Function            PT Goals (current goals can now be found in the care plan section) Progress towards PT goals: Goals met/education completed, patient discharged from PT    Frequency  Min 3X/week    PT Plan Discharge plan needs to be updated    Co-evaluation             End of Session Equipment  Utilized During Treatment:  (Sling) Activity Tolerance: Patient tolerated treatment well Patient left: in bed;with call bell/phone within reach     Time: 1014-1030 PT Time Calculation (min): 16 min  Charges:  $Therapeutic Activity: 8-22 mins                    G Codes:      Despina Pole 04-Apr-2014, 10:41 AM Carita Pian. Sanjuana Kava, [place] Pager [phone]

## 2014-03-10 NOTE — Progress Notes (Signed)
Occupational Therapy Treatment Patient Details Name: Tara Moreno MRN: [id number] DOB: 1946-01-27 Today's Date: 03/10/2014    History of present illness Ajane R. Diver is a 68 y.o. retired female s/p Rt Reverse TSA on 03/08/14. PMH of HTN, arthritis, and GERD.    OT comments  Pt seen today for ADL session and to review therapeutic exercises. Pt significantly improved today with increased mobility and good recall of exercises. Pt assisted in dressing using compensatory techniques and is safe for d/c home from OT standpoint.    Follow Up Recommendations  No OT follow up;Other (comment) (HHPT to address balance and UE exercises)    Equipment Recommendations  None recommended by OT    Recommendations for Other Services      Precautions / Restrictions Precautions Precautions: Shoulder;Fall Type of Shoulder Precautions: Norris reverse protocol (AAROM of shoulder FF, ER/IR and elbow,wrist,hand and pendulums) Shoulder Interventions: Shoulder sling/immobilizer;For comfort Precaution Comments: Reinforced education on precautions. Required Braces or Orthoses: Sling Restrictions Weight Bearing Restrictions: Yes RUE Weight Bearing: Non weight bearing       Mobility Bed Mobility Overal bed mobility: Modified Independent Bed Mobility: Supine to Sit;Sit to Supine     Supine to sit: Modified independent (Device/Increase time) Sit to supine: Modified independent (Device/Increase time)   General bed mobility comments: Removed rail.  Patient able to move supine <> sit with increased time and no assist.  Did require min assist to don/adjust sling.  Transfers Overall transfer level: Modified independent Equipment used: None Transfers: Sit to/from Stand Sit to Stand: Modified independent (Device/Increase time)         General transfer comment: Increased time.  Good balance in standing    Balance           Standing balance support: No upper extremity supported Standing  balance-Leahy Scale: Good               High level balance activites: Direction changes;Turns;Sudden stops;Head turns High Level Balance Comments: No loss of balance with high level activities   ADL Overall ADL's : Needs assistance/impaired     Grooming: Modified independent;Standing           Upper Body Dressing : Minimal assistance;Sitting Upper Body Dressing Details (indicate cue type and reason): including sling Lower Body Dressing: Supervision/safety;Sit to/from stand   Toilet Transfer: Modified Independent           Functional mobility during ADLs: Modified independent General ADL Comments: Pt significantly improved today with minimal to no pain reported. Pt recalled UE therapeutic exercises and assisted pt with donning clothing to prepare for d/c using compensatory techniques.                 Cognition  Arousal/Alertness: Awake/Alert Behavior During Therapy: WFL for tasks assessed/performed Overall Cognitive Status: Within Functional Limits for tasks assessed                            Shoulder Instructions Shoulder Instructions Donning/doffing shirt without moving shoulder: Min-guard Method for sponge bathing under operated UE: Modified independent Donning/doffing sling/immobilizer: Minimal assistance Correct positioning of sling/immobilizer: Min-guard Pendulum exercises (written home exercise program): Supervision/safety ROM for elbow, wrist and digits of operated UE: Independent Sling wearing schedule (on at all times/off for ADL's): Independent Proper positioning of operated UE when showering: Independent Positioning of UE while sleeping: Independent          Pertinent Vitals/ Pain       Pain Assessment:  No/denies pain Pain Score: 3  Pain Location: Rt shoulder Pain Descriptors / Indicators: Sore Pain Intervention(s): Limited activity within patient's tolerance         Frequency Min 3X/week     Progress Toward Goals  OT  Goals(current goals can now be found in the care plan section)  Progress towards OT goals: Progressing toward goals     Plan Discharge plan needs to be updated       End of Session Equipment Utilized During Treatment: Other (comment) (sling)   Activity Tolerance Patient tolerated treatment well   Patient Left in bed;with call bell/phone within reach   Nurse Communication          Time: 9381-8299 OT Time Calculation (min): 17 min  Charges: OT General Charges $OT Visit: 1 Procedure OT Treatments $Self Care/Home Management : 8-22 mins  Villa Herb M 03/10/2014, 2:03 PM   Cyndie Chime, OTR/L Occupational Therapist [phone] (pager)

## 2014-03-10 NOTE — Care Management Note (Signed)
    Page 1 of 2   03/10/2014     3:05:52 PM CARE MANAGEMENT NOTE 03/10/2014  Patient:  Moreno,Tara R   Account Number:  [id number]  Date Initiated:  03/10/2014  Documentation initiated by:  [facility]  Subjective/Objective Assessment:   adm: RIGHT TOTAL SHOULDER ARTHROPLASTY VERSES A REVERSE ARTHROPLASTY (Right)     Action/Plan:   discharge planning   Anticipated DC Date:  03/10/2014   Anticipated DC Plan:  [place]  CM consult      [facility] Choice  HOME HEALTH   Choice offered to / List presented to:  C-1 Patient   DME arranged  NA      DME agency  NA     [place] arranged  [place].   Status of service:  Completed, signed off Medicare Important Message given?   (If response is "NO", the following Medicare IM given date fields will be blank) Date Medicare IM given:   Medicare IM given by:   Date Additional Medicare IM given:   Additional Medicare IM given by:    Discharge Disposition:  [place]  Per UR Regulation:    If discussed at Long Length of Stay Meetings, dates discussed:    Comments:  05/10/13 15:00 CM spoke with pt to offer choice of home health agency.  Pt chooses [facility] to render HHPT/OT.  Address and contact information verified with pt. Referral called to [facility] rep, [place].  No DME needed.  Mariane Masters, BSN, CM [phone].

## 2014-03-10 NOTE — Progress Notes (Signed)
Subjective: 2 Days Post-Op Procedure(s) (LRB): RIGHT TOTAL SHOULDER ARTHROPLASTY VERSES A REVERSE ARTHROPLASTY (Right) Patient reports pain as mild.  Well controlled with oral pain meds.  Objective: Vital signs in last 24 hours: Temp:  [98.4 F (36.9 C)-98.7 F (37.1 C)] 98.4 F (36.9 C) (11/01 0456) Pulse Rate:  [82-98] 82 (11/01 0456) Resp:  [18] 18 (11/01 0456) BP: (100-135)/(49-72) 100/49 mmHg (11/01 0456) SpO2:  [94 %-98 %] 94 % (11/01 0456)  Intake/Output from previous day: 10/31 0701 - 11/01 0700 In: 360 [P.O.:360] Out: -  Intake/Output this shift:     Recent Labs  03/09/14 1248  HGB 12.1    Recent Labs  03/09/14 1248  HCT 36.0    Recent Labs  03/09/14 1248  NA 135*  K 3.9  CL 96  CO2 25  BUN 21  CREATININE 0.82  GLUCOSE 123*  CALCIUM 9.0   No results for input(s): LABPT, INR in the last 72 hours.  PE:  wn wd woman in nad.  R shoulder dressed and dry.  NVI at R UE.  Assessment/Plan: 2 Days Post-Op Procedure(s) (LRB): RIGHT TOTAL SHOULDER ARTHROPLASTY VERSES A REVERSE ARTHROPLASTY (Right) D/c home today.  Wylene Simmer 03/10/2014, 9:01 AM

## 2014-03-10 NOTE — Plan of Care (Signed)
Problem: Acute Rehab PT Goals(only PT should resolve) Goal: Pt Will Go Supine/Side To Sit Outcome: Completed/Met Date Met:  03/10/14 Goal: Patient Will Transfer Sit To/From Stand Outcome: Completed/Met Date Met:  03/10/14 Goal: Pt Will Ambulate Outcome: Completed/Met Date Met:  03/10/14 Goal: Pt Will Verbalize and Adhere to Precautions While PT Will Verbalize and Adhere to Precautions While Performing Mobility  Outcome: Completed/Met Date Met:  03/10/14

## 2014-03-11 ENCOUNTER — Encounter (HOSPITAL_COMMUNITY): Payer: Self-pay | Admitting: Orthopedic Surgery

## 2014-03-18 NOTE — Discharge Summary (Signed)
Physician Discharge Summary   Patient ID: [name] MRN: [id number] DOB/AGE: 1945-08-31 68 y.o.  Admit date: 03/08/2014 Discharge date: 03/18/2014  Admission Diagnoses:  Active Problems:   Arthritis of shoulder region, right, degenerative   Discharge Diagnoses:  Same   Surgeries: Procedure(s): RIGHT TOTAL SHOULDER ARTHROPLASTY VERSES A REVERSE ARTHROPLASTY on 03/08/2014   Consultants: Occupational Therapy  Discharged Condition: Stable  Hospital Course: Tara Moreno is an 68 y.o. female who was admitted 03/08/2014 with a chief complaint of right shoulder pain, and found to have a diagnosis of right shoulder OA.  They were brought to the operating room on 03/08/2014 and underwent the above named procedures.    The patient had an uncomplicated hospital course and was stable for discharge.  Recent vital signs:  Filed Vitals:   03/10/14 0456  BP: 100/49  Pulse: 82  Temp: 98.4 F (36.9 C)  Resp: 18    Recent laboratory studies:  Results for orders placed or performed during the hospital encounter of 03/08/14  Hemoglobin and hematocrit, blood  Result Value Ref Range   Hemoglobin 12.1 12.0 - 15.0 g/dL   HCT 36.0 36.0 - 57.0 %  Basic metabolic panel  Result Value Ref Range   Sodium 135 (L) 137 - 147 mEq/L   Potassium 3.9 3.7 - 5.3 mEq/L   Chloride 96 96 - 112 mEq/L   CO2 25 19 - 32 mEq/L   Glucose, Bld 123 (H) 70 - 99 mg/dL   BUN 21 6 - 23 mg/dL   Creatinine, Ser 0.82 0.50 - 1.10 mg/dL   Calcium 9.0 8.4 - 10.5 mg/dL   GFR calc non Af Amer 73 (L) >90 mL/min   GFR calc Af Amer 85 (L) >90 mL/min   Anion gap 14 5 - 15  Glucose, capillary  Result Value Ref Range   Glucose-Capillary 117 (H) 70 - 99 mg/dL    Discharge Medications:     Medication List    TAKE these medications        atorvastatin 10 MG tablet  Commonly known as:  LIPITOR  Take 10 mg by mouth daily.     AZOR 10-40 MG per tablet  Generic drug:  amLODipine-olmesartan  Take 1 tablet by  mouth daily.     Fish Oil 1000 MG Caps  Take 3,000 mg by mouth daily.     GREEN TEA EXTRACT PO  Take 1 capsule by mouth daily.     meloxicam 15 MG tablet  Commonly known as:  MOBIC  Take 15 mg by mouth daily.     methocarbamol 500 MG tablet  Commonly known as:  ROBAXIN  Take 1 tablet (500 mg total) by mouth 3 (three) times daily as needed.     MILK THISTLE PO  Take 1 capsule by mouth daily.     niacin 500 MG tablet  Take 500 mg by mouth daily.     OSTEO BI-FLEX ADV TRIPLE ST PO  Take by mouth daily.     oxyCODONE-acetaminophen 5-325 MG per tablet  Commonly known as:  ROXICET  Take 1-2 tablets by mouth every 4 (four) hours as needed for severe pain.     pantoprazole 40 MG tablet  Commonly known as:  PROTONIX  Take 40 mg by mouth daily.     PROBIOTIC DAILY PO  Take by mouth daily.     UBIQUINOL PO  Take 100 mg by mouth daily.        Diagnostic Studies: Dg Chest 2  View  02/22/2014   CLINICAL DATA:  Preoperative right shoulder arthroplasty. Hypertension.  EXAM: CHEST  2 VIEW  COMPARISON:  May 17, 2011  FINDINGS: There is no edema or consolidation. Heart is enlarged with pulmonary vascularity within normal limits. No adenopathy. There is degenerative change in the right shoulder with synovial chondromatosis. There are surgical clips in the posterior left upper quadrant region.  IMPRESSION: No edema or consolidation.  Stable cardiac enlargement.   Electronically Signed   By: Lowella Grip M.D.   On: 02/22/2014 12:30   Dg Shoulder Right  03/08/2014   CLINICAL DATA:  Initial encounter for shoulder replacement secondary to osteoarthritis and rotator cuff insufficiency.  EXAM: RIGHT SHOULDER - 2+ VIEW  COMPARISON:  None.  FINDINGS: A single portable view obtained at 1046 hrs shows the patient to be status post right shoulder replacement. No evidence for acute hardware complications. Gas in the soft tissues is compatible with the immediate postoperative state. There is  some atelectasis visible in the right lung base.  IMPRESSION: Status post right shoulder replacement. No evidence for immediate hardware complications apparent.   Electronically Signed   By: Misty Stanley M.D.   On: 03/08/2014 11:25    Disposition: 01-Home or Self Care      Discharge Instructions    Call MD / Call 911    Complete by:  As directed   If you experience chest pain or shortness of breath, CALL 911 and be transported to the hospital emergency room.  If you develope a fever above 101 F, pus (white drainage) or increased drainage or redness at the wound, or calf pain, call your surgeon's office.     Constipation Prevention    Complete by:  As directed   Drink plenty of fluids.  Prune juice may be helpful.  You may use a stool softener, such as Colace (over the counter) 100 mg twice a day.  Use MiraLax (over the counter) for constipation as needed.     Diet - low sodium heart healthy    Complete by:  As directed      Increase activity slowly as tolerated    Complete by:  As directed            Follow-up Information    Follow up with Augustin Schooling, MD.   Specialty:  Orthopedic Surgery   Why:  [phone]   Contact information:   [address] [place] [address] [place] [address] [phone]       Follow up with [facility].   Why:  home health physical and occupational therapy   Contact information:   [address] [place] [address] [phone]        Signed: Augustin Schooling 03/18/2014, 4:46 PM

## 2014-05-20 ENCOUNTER — Other Ambulatory Visit: Payer: Self-pay

## 2014-05-20 DIAGNOSIS — Z1231 Encounter for screening mammogram for malignant neoplasm of breast: Secondary | ICD-10-CM

## 2014-06-11 ENCOUNTER — Other Ambulatory Visit: Payer: Self-pay | Admitting: Obstetrics & Gynecology

## 2014-06-12 LAB — CYTOLOGY - PAP

## 2014-06-18 ENCOUNTER — Other Ambulatory Visit: Payer: Self-pay

## 2014-06-18 ENCOUNTER — Ambulatory Visit
Admission: RE | Admit: 2014-06-18 | Discharge: 2014-06-18 | Disposition: A | Discharge status: Home / Self Care | Payer: Commercial Managed Care - HMO | Source: Ambulatory Visit

## 2014-06-18 DIAGNOSIS — Z1231 Encounter for screening mammogram for malignant neoplasm of breast: Secondary | ICD-10-CM

## 2014-06-21 ENCOUNTER — Encounter (HOSPITAL_COMMUNITY): Payer: Self-pay

## 2014-06-21 ENCOUNTER — Encounter (HOSPITAL_COMMUNITY)
Admission: RE | Admit: 2014-06-21 | Discharge: 2014-06-21 | Disposition: A | Discharge status: Home / Self Care | Payer: Medicare HMO | Source: Ambulatory Visit | Attending: Orthopedic Surgery | Admitting: Orthopedic Surgery

## 2014-06-21 DIAGNOSIS — Z01810 Encounter for preprocedural cardiovascular examination: Secondary | ICD-10-CM | POA: Diagnosis not present

## 2014-06-21 DIAGNOSIS — Z79899 Other long term (current) drug therapy: Secondary | ICD-10-CM | POA: Diagnosis not present

## 2014-06-21 DIAGNOSIS — K219 Gastro-esophageal reflux disease without esophagitis: Secondary | ICD-10-CM | POA: Diagnosis not present

## 2014-06-21 DIAGNOSIS — Z01812 Encounter for preprocedural laboratory examination: Secondary | ICD-10-CM | POA: Diagnosis not present

## 2014-06-21 DIAGNOSIS — I10 Essential (primary) hypertension: Secondary | ICD-10-CM | POA: Diagnosis not present

## 2014-06-21 DIAGNOSIS — M19012 Primary osteoarthritis, left shoulder: Secondary | ICD-10-CM | POA: Insufficient documentation

## 2014-06-21 DIAGNOSIS — E785 Hyperlipidemia, unspecified: Secondary | ICD-10-CM | POA: Insufficient documentation

## 2014-06-21 LAB — SURGICAL PCR SCREEN
MRSA, PCR: NEGATIVE
STAPHYLOCOCCUS AUREUS: NEGATIVE

## 2014-06-21 LAB — CBC
HEMATOCRIT: 41.4 % (ref 36.0–46.0)
Hemoglobin: 13.6 g/dL (ref 12.0–15.0)
MCH: 28.7 pg (ref 26.0–34.0)
MCHC: 32.9 g/dL (ref 30.0–36.0)
MCV: 87.3 fL (ref 78.0–100.0)
Platelets: 317 10*3/uL (ref 150–400)
RBC: 4.74 MIL/uL (ref 3.87–5.11)
RDW: 14.2 % (ref 11.5–15.5)
WBC: 6.4 10*3/uL (ref 4.0–10.5)

## 2014-06-21 LAB — BASIC METABOLIC PANEL
Anion gap: 10 (ref 5–15)
BUN: 17 mg/dL (ref 6–23)
CO2: 24 mmol/L (ref 19–32)
Calcium: 9.4 mg/dL (ref 8.4–10.5)
Chloride: 102 mmol/L (ref 96–112)
Creatinine, Ser: 0.94 mg/dL (ref 0.50–1.10)
GFR calc Af Amer: 72 mL/min — ABNORMAL LOW (ref >90)
GFR, EST NON AFRICAN AMERICAN: 62 mL/min — AB (ref >90)
GLUCOSE: 99 mg/dL (ref 70–99)
POTASSIUM: 4.2 mmol/L (ref 3.5–5.1)
SODIUM: 136 mmol/L (ref 135–145)

## 2014-06-21 NOTE — Pre-Procedure Instructions (Addendum)
GWENDALYN MCGONAGLE  06/21/2014   Your procedure is scheduled on:  07/05/14  Report to [facility] short stay admitting at 530 AM.  Call this number if you have problems the morning of surgery: [phone]   Remember:   Do not eat food or drink liquids after midnight.   Take these medicines the morning of surgery with A SIP OF WATER: prilosec, pain med if needed     STOP all herbel meds, nsaids (aleve,naproxen,advil,ibuprofen) 5 days prior to surgery starting 06/30/14 including aspirin, vitamins,osteo-biflex, green tea extract,ubiquinol,meloxicam, milk thistle, niacin, fish oil, probiotic     Do not wear jewelry, make-up or nail polish.  Do not wear lotions, powders, or perfumes. You may wear deodorant.  Do not shave 48 hours prior to surgery. Men may shave face and neck.  Do not bring valuables to the hospital.  [facility] is not responsible                  for any belongings or valuables.               Contacts, dentures or bridgework may not be worn into surgery.  Leave suitcase in the car. After surgery it may be brought to your room.  For patients admitted to the hospital, discharge time is determined by your                treatment team.               Patients discharged the day of surgery will not be allowed to drive  home.  Name and phone number of your driver:   Special Instructions:  Special Instructions: [place] - Preparing for Surgery  Before surgery, you can play an important role.  Because skin is not sterile, your skin needs to be as free of germs as possible.  You can reduce the number of germs on you skin by washing with CHG (chlorahexidine gluconate) soap before surgery.  CHG is an antiseptic cleaner which kills germs and bonds with the skin to continue killing germs even after washing.  Please DO NOT use if you have an allergy to CHG or antibacterial soaps.  If your skin becomes reddened/irritated stop using the CHG and inform your nurse when you arrive at Short  Stay.  Do not shave (including legs and underarms) for at least 48 hours prior to the first CHG shower.  You may shave your face.  Please follow these instructions carefully:   1.  Shower with CHG Soap the night before surgery and the morning of Surgery.  2.  If you choose to wash your hair, wash your hair first as usual with your normal shampoo.  3.  After you shampoo, rinse your hair and body thoroughly to remove the Shampoo.  4.  Use CHG as you would any other liquid soap.  You can apply chg directly  to the skin and wash gently with scrungie or a clean washcloth.  5.  Apply the CHG Soap to your body ONLY FROM THE NECK DOWN.  Do not use on open wounds or open sores.  Avoid contact with your eyes ears, mouth and genitals (private parts).  Wash genitals (private parts)       with your normal soap.  6.  Wash thoroughly, paying special attention to the area where your surgery will be performed.  7.  Thoroughly rinse your body with warm water from the neck down.  8.  DO NOT shower/wash  with your normal soap after using and rinsing off the CHG Soap.  9.  Pat yourself dry with a clean towel.            10.  Wear clean pajamas.            11.  Place clean sheets on your bed the night of your first shower and do not sleep with pets.  Day of Surgery  Do not apply any lotions/deodorants the morning of surgery.  Please wear clean clothes to the hospital/surgery center.   Please read over the following fact sheets that you were given: Pain Booklet, Coughing and Deep Breathing, MRSA Information and Surgical Site Infection Prevention

## 2014-06-21 NOTE — Progress Notes (Signed)
Anesthesia Chart Review:  Pt is 69 year old female scheduled for L reverse total shoulder arthroplasty on 07/05/2014 with Dr. Veverly Fells.   PMH: HTN, hyperlipidemia, GERD. BMI 38 S/p R total shoulder arthroplasty 03/08/2014.   Medications include: amlodipine/olmesartan, lipitor, protonix  Preoperative labs reviewed.    Chest x-ray 02/22/2014 reviewed. No edema or consolidation. Stable cardiac enlargement.  EKG 06/21/2014: NSR. No LBBB. EKG from 02/22/2014 showed NSR. Possible LA enlargement. LBBB.   Nuclear med stress test 08/16/2012: Normal stress nuclear study. No evidence of ischemia. Normal LV function. LV Ejection Fraction: 76%. LV Wall Motion: NL LV Function; NL Wall Motion.  Willeen Cass, FNP-BC [facility] Short Stay Surgical Center/Anesthesiology Phone: [phone] 06/21/2014 3:57 PM

## 2014-06-25 NOTE — H&P (Signed)
Tara Moreno is an 69 y.o. female.    Chief Complaint: left shoulder pain  HPI: Pt is a 69 y.o. female complaining of left shoulder pain for multiple years. Pain had continually increased since the beginning. X-rays in the clinic show end-stage arthritic changes of the left shoulder. Pt has tried various conservative treatments which have failed to alleviate their symptoms, including injections and therapy. Various options are discussed with the patient. Risks, benefits and expectations were discussed with the patient. Patient understand the risks, benefits and expectations and wishes to proceed with surgery.   PCP:  Patricia Nettle, MD  D/C Plans:  Home   PMH: Past Medical History  Diagnosis Date  . Hypertension   . Arthritis   . PONV (postoperative nausea and vomiting)   . Frequent urination at night   . GERD (gastroesophageal reflux disease)   . Hyperlipemia     PSH: Past Surgical History  Procedure Laterality Date  . Abdominal hysterectomy    . Removal of the renal tumor    . Tonsillectomy    . Partial hysterectomy    . Banectomy    . Colonoscopy    . Breast enhancement surgery  2000  . Cardiac catheterization      no PCI; had before renal tumor was removed  . Revision total shoulder arthroplasty Right 03/08/2014    dr Veverly Fells  . Reverse shoulder arthroplasty Right 03/08/2014    Procedure: RIGHT TOTAL SHOULDER ARTHROPLASTY VERSES A REVERSE ARTHROPLASTY;  Surgeon: Augustin Schooling, MD;  Location: [facility];  Service: Orthopedics;  Laterality: Right;    Social History:  reports that she has never smoked. She has never used smokeless tobacco. She reports that she does not drink alcohol or use illicit drugs.  Allergies:  No Known Allergies  Medications: No current facility-administered medications for this encounter.   Current Outpatient Prescriptions  Medication Sig Dispense Refill  . amLODipine-olmesartan (AZOR) 10-40 MG per tablet Take 1 tablet by mouth daily.      Marland Kitchen atorvastatin (LIPITOR) 10 MG tablet Take 10 mg by mouth daily.    . CycloSPORINE (RESTASIS OP) Place 1 drop into both eyes daily as needed. For dry eyes    . Green Tea, Camillia sinensis, (GREEN TEA EXTRACT PO) Take 1 capsule by mouth daily.    . meloxicam (MOBIC) 15 MG tablet Take 15 mg by mouth See admin instructions. Sunday Wednesday Thursdays    . methocarbamol (ROBAXIN) 500 MG tablet Take 1 tablet (500 mg total) by mouth 3 (three) times daily as needed. (Patient taking differently: Take 500 mg by mouth 3 (three) times daily as needed for muscle spasms. ) 60 tablet 1  . MILK THISTLE PO Take 1 capsule by mouth daily.    . Misc Natural Products (OSTEO BI-FLEX ADV TRIPLE ST PO) Take by mouth daily.    . Multiple Vitamin (MULTIVITAMIN WITH MINERALS) TABS tablet Take 1 tablet by mouth daily.    . niacin 500 MG tablet Take 500 mg by mouth daily.      . Omega-3 Fatty Acids (FISH OIL) 1000 MG CAPS Take 3,000 mg by mouth daily.     Marland Kitchen oxyCODONE-acetaminophen (ROXICET) 5-325 MG per tablet Take 1-2 tablets by mouth every 4 (four) hours as needed for severe pain. 60 tablet 0  . pantoprazole (PROTONIX) 40 MG tablet Take 40 mg by mouth daily.    . Probiotic Product (PROBIOTIC DAILY PO) Take by mouth daily.    Marland Kitchen UBIQUINOL PO Take 100 mg  by mouth daily.      No results found for this or any previous visit (from the past 48 hour(s)). No results found.  ROS: Pain with rom of the left upper extremity  Physical Exam:  Alert and oriented 69 y.o. female in no acute distress Cranial nerves 2-12 intact Cervical spine: full rom with no tenderness, nv intact distally Chest: active breath sounds bilaterally, no wheeze rhonchi or rales Heart: regular rate and rhythm, no murmur Abd: non tender non distended with active bowel sounds Hip is stable with rom  Left shoulder restricted rom and weakness nv intact distally Strength of ER and IR 3.5/5 with right shoulder  Assessment/Plan Assessment: left  shoulder rotator cuff insufficiency   Plan: Patient will undergo a left reverse total shoulder by Dr. Veverly Fells at [facility]. Risks benefits and expectations were discussed with the patient. Patient understand risks, benefits and expectations and wishes to proceed.

## 2014-07-04 MED ORDER — CHLORHEXIDINE GLUCONATE 4 % EX LIQD
60.0000 mL | Freq: Once | CUTANEOUS | Status: DC
  Filled 2014-07-04: qty 60

## 2014-07-04 MED ORDER — CEFAZOLIN SODIUM-DEXTROSE 2-3 GM-% IV SOLR
2.0000 g | INTRAVENOUS | Status: AC
  Administered 2014-07-05: 2 g via INTRAVENOUS
  Filled 2014-07-04: qty 50

## 2014-07-05 ENCOUNTER — Encounter (HOSPITAL_COMMUNITY)
Admission: RE | Disposition: A | Discharge status: Home Health | Payer: Self-pay | Source: Ambulatory Visit | Attending: Orthopedic Surgery

## 2014-07-05 ENCOUNTER — Inpatient Hospital Stay (HOSPITAL_COMMUNITY)
Admission: RE | Admit: 2014-07-05 | Discharge: 2014-07-07 | DRG: 483 | Disposition: A | Discharge status: Home Health | Payer: Medicare HMO | Source: Ambulatory Visit | Attending: Orthopedic Surgery | Admitting: Orthopedic Surgery

## 2014-07-05 ENCOUNTER — Inpatient Hospital Stay (HOSPITAL_COMMUNITY): Payer: Medicare HMO

## 2014-07-05 ENCOUNTER — Inpatient Hospital Stay (HOSPITAL_COMMUNITY): Payer: Medicare HMO | Admitting: Certified Registered"

## 2014-07-05 ENCOUNTER — Encounter (HOSPITAL_COMMUNITY): Payer: Self-pay | Admitting: *Deleted

## 2014-07-05 ENCOUNTER — Inpatient Hospital Stay (HOSPITAL_COMMUNITY): Payer: Medicare HMO | Admitting: Emergency Medicine

## 2014-07-05 DIAGNOSIS — I10 Essential (primary) hypertension: Secondary | ICD-10-CM | POA: Diagnosis present

## 2014-07-05 DIAGNOSIS — K219 Gastro-esophageal reflux disease without esophagitis: Secondary | ICD-10-CM | POA: Diagnosis present

## 2014-07-05 DIAGNOSIS — Z96612 Presence of left artificial shoulder joint: Secondary | ICD-10-CM

## 2014-07-05 DIAGNOSIS — E785 Hyperlipidemia, unspecified: Secondary | ICD-10-CM | POA: Diagnosis present

## 2014-07-05 DIAGNOSIS — M25512 Pain in left shoulder: Secondary | ICD-10-CM | POA: Diagnosis present

## 2014-07-05 DIAGNOSIS — M19012 Primary osteoarthritis, left shoulder: Principal | ICD-10-CM | POA: Diagnosis present

## 2014-07-05 DIAGNOSIS — Z96619 Presence of unspecified artificial shoulder joint: Secondary | ICD-10-CM

## 2014-07-05 DIAGNOSIS — Z96611 Presence of right artificial shoulder joint: Secondary | ICD-10-CM | POA: Diagnosis present

## 2014-07-05 HISTORY — PX: REVERSE SHOULDER ARTHROPLASTY: SHX5054

## 2014-07-05 SURGERY — ARTHROPLASTY, SHOULDER, TOTAL, REVERSE
Anesthesia: Regional | Site: Shoulder | Laterality: Left

## 2014-07-05 MED ORDER — DEXAMETHASONE SODIUM PHOSPHATE 4 MG/ML IJ SOLN
INTRAMUSCULAR | Status: AC
  Filled 2014-07-05: qty 1

## 2014-07-05 MED ORDER — OXYCODONE-ACETAMINOPHEN 5-325 MG PO TABS
1.0000 | ORAL_TABLET | ORAL | Status: DC | PRN
  Administered 2014-07-05: 1 via ORAL
  Administered 2014-07-06 – 2014-07-07 (×4): 2 via ORAL
  Filled 2014-07-05 (×5): qty 2

## 2014-07-05 MED ORDER — METHOCARBAMOL 500 MG PO TABS: 500.0000 mg | ORAL_TABLET | Freq: Three times a day (TID) | ORAL | Status: DC | PRN

## 2014-07-05 MED ORDER — SCOPOLAMINE 1 MG/3DAYS TD PT72
MEDICATED_PATCH | TRANSDERMAL | Status: AC
  Administered 2014-07-05: 1 via TRANSDERMAL
  Filled 2014-07-05: qty 1

## 2014-07-05 MED ORDER — OXYCODONE-ACETAMINOPHEN 5-325 MG PO TABS
ORAL_TABLET | ORAL | Status: AC
  Filled 2014-07-05: qty 1

## 2014-07-05 MED ORDER — BUPIVACAINE-EPINEPHRINE (PF) 0.5% -1:200000 IJ SOLN
INTRAMUSCULAR | Status: DC | PRN
  Administered 2014-07-05: 30 mL via PERINEURAL

## 2014-07-05 MED ORDER — ACETAMINOPHEN 325 MG PO TABS: 650.0000 mg | ORAL_TABLET | Freq: Four times a day (QID) | ORAL | Status: DC | PRN

## 2014-07-05 MED ORDER — AMLODIPINE-OLMESARTAN 10-40 MG PO TABS: 1.0000 | ORAL_TABLET | Freq: Every day | ORAL | Status: DC

## 2014-07-05 MED ORDER — ATORVASTATIN CALCIUM 10 MG PO TABS
10.0000 mg | ORAL_TABLET | Freq: Every day | ORAL | Status: DC
  Administered 2014-07-06: 10 mg via ORAL
  Filled 2014-07-05 (×2): qty 1

## 2014-07-05 MED ORDER — SODIUM CHLORIDE 0.9 % IV SOLN
INTRAVENOUS | Status: DC
  Administered 2014-07-05: 50 mL/h via INTRAVENOUS
  Administered 2014-07-06: 09:00:00 via INTRAVENOUS

## 2014-07-05 MED ORDER — PHENOL 1.4 % MT LIQD
1.0000 | OROMUCOSAL | Status: DC | PRN
  Filled 2014-07-05: qty 177

## 2014-07-05 MED ORDER — CEFAZOLIN SODIUM-DEXTROSE 2-3 GM-% IV SOLR
2.0000 g | Freq: Four times a day (QID) | INTRAVENOUS | Status: AC
  Administered 2014-07-05 – 2014-07-06 (×3): 2 g via INTRAVENOUS
  Filled 2014-07-05 (×3): qty 50

## 2014-07-05 MED ORDER — SODIUM CHLORIDE 0.9 % IR SOLN
Status: DC | PRN
  Administered 2014-07-05: 1000 mL

## 2014-07-05 MED ORDER — METOCLOPRAMIDE HCL 5 MG/ML IJ SOLN: 5.0000 mg | Freq: Three times a day (TID) | INTRAMUSCULAR | Status: DC | PRN

## 2014-07-05 MED ORDER — HYDROMORPHONE HCL 1 MG/ML IJ SOLN
0.5000 mg | INTRAMUSCULAR | Status: DC | PRN
  Administered 2014-07-06 – 2014-07-07 (×4): 1 mg via INTRAVENOUS
  Filled 2014-07-05 (×4): qty 1

## 2014-07-05 MED ORDER — HYDROMORPHONE HCL 1 MG/ML IJ SOLN
0.2500 mg | INTRAMUSCULAR | Status: DC | PRN
  Administered 2014-07-05 (×2): 0.5 mg via INTRAVENOUS

## 2014-07-05 MED ORDER — LACTATED RINGERS IV SOLN
INTRAVENOUS | Status: DC | PRN
  Administered 2014-07-05: 07:00:00 via INTRAVENOUS

## 2014-07-05 MED ORDER — SUCCINYLCHOLINE CHLORIDE 20 MG/ML IJ SOLN
INTRAMUSCULAR | Status: AC
  Filled 2014-07-05: qty 1

## 2014-07-05 MED ORDER — MIDAZOLAM HCL 2 MG/2ML IJ SOLN
INTRAMUSCULAR | Status: AC
  Filled 2014-07-05: qty 2

## 2014-07-05 MED ORDER — GLYCOPYRROLATE 0.2 MG/ML IJ SOLN
INTRAMUSCULAR | Status: AC
  Filled 2014-07-05: qty 2

## 2014-07-05 MED ORDER — DEXAMETHASONE SODIUM PHOSPHATE 4 MG/ML IJ SOLN
INTRAMUSCULAR | Status: DC | PRN
  Administered 2014-07-05: 4 mg via INTRAVENOUS

## 2014-07-05 MED ORDER — ROCURONIUM BROMIDE 50 MG/5ML IV SOLN
INTRAVENOUS | Status: AC
  Filled 2014-07-05: qty 1

## 2014-07-05 MED ORDER — METHOCARBAMOL 500 MG PO TABS
500.0000 mg | ORAL_TABLET | Freq: Three times a day (TID) | ORAL | Status: DC | PRN
  Administered 2014-07-06 – 2014-07-07 (×3): 500 mg via ORAL
  Filled 2014-07-05 (×3): qty 1

## 2014-07-05 MED ORDER — METOCLOPRAMIDE HCL 10 MG PO TABS: 5.0000 mg | ORAL_TABLET | Freq: Three times a day (TID) | ORAL | Status: DC | PRN

## 2014-07-05 MED ORDER — PROPOFOL 10 MG/ML IV BOLUS
INTRAVENOUS | Status: AC
  Filled 2014-07-05: qty 20

## 2014-07-05 MED ORDER — ADULT MULTIVITAMIN W/MINERALS CH
1.0000 | ORAL_TABLET | Freq: Every day | ORAL | Status: DC
Start: 2014-07-05 — End: 2014-07-07
  Administered 2014-07-05 – 2014-07-06 (×2): 1 via ORAL
  Filled 2014-07-05 (×3): qty 1

## 2014-07-05 MED ORDER — FENTANYL CITRATE 0.05 MG/ML IJ SOLN
INTRAMUSCULAR | Status: AC
  Filled 2014-07-05: qty 5

## 2014-07-05 MED ORDER — PANTOPRAZOLE SODIUM 40 MG PO TBEC
40.0000 mg | DELAYED_RELEASE_TABLET | Freq: Every day | ORAL | Status: DC
  Administered 2014-07-05 – 2014-07-07 (×3): 40 mg via ORAL
  Filled 2014-07-05 (×3): qty 1

## 2014-07-05 MED ORDER — HYDROMORPHONE HCL 1 MG/ML IJ SOLN
INTRAMUSCULAR | Status: AC
  Filled 2014-07-05: qty 1

## 2014-07-05 MED ORDER — ACETAMINOPHEN 650 MG RE SUPP: 650.0000 mg | Freq: Four times a day (QID) | RECTAL | Status: DC | PRN

## 2014-07-05 MED ORDER — AMLODIPINE BESYLATE 10 MG PO TABS
10.0000 mg | ORAL_TABLET | Freq: Every day | ORAL | Status: DC
  Administered 2014-07-05 – 2014-07-07 (×3): 10 mg via ORAL
  Filled 2014-07-05 (×3): qty 1

## 2014-07-05 MED ORDER — ONDANSETRON HCL 4 MG/2ML IJ SOLN: 4.0000 mg | Freq: Four times a day (QID) | INTRAMUSCULAR | Status: DC | PRN

## 2014-07-05 MED ORDER — LIDOCAINE HCL (CARDIAC) 20 MG/ML IV SOLN
INTRAVENOUS | Status: DC | PRN
  Administered 2014-07-05: 20 mg via INTRAVENOUS

## 2014-07-05 MED ORDER — LIDOCAINE HCL (CARDIAC) 20 MG/ML IV SOLN
INTRAVENOUS | Status: AC
  Filled 2014-07-05: qty 5

## 2014-07-05 MED ORDER — CYCLOSPORINE 0.05 % OP EMUL
1.0000 [drp] | Freq: Every day | OPHTHALMIC | Status: DC | PRN
  Filled 2014-07-05: qty 1

## 2014-07-05 MED ORDER — PHENYLEPHRINE HCL 10 MG/ML IJ SOLN
10.0000 mg | INTRAVENOUS | Status: DC | PRN
  Administered 2014-07-05: 40 ug/min via INTRAVENOUS

## 2014-07-05 MED ORDER — BUPIVACAINE-EPINEPHRINE 0.25% -1:200000 IJ SOLN
INTRAMUSCULAR | Status: DC | PRN
  Administered 2014-07-05: 9 mL

## 2014-07-05 MED ORDER — ONDANSETRON HCL 4 MG/2ML IJ SOLN
INTRAMUSCULAR | Status: AC
  Filled 2014-07-05: qty 2

## 2014-07-05 MED ORDER — NEOSTIGMINE METHYLSULFATE 10 MG/10ML IV SOLN
INTRAVENOUS | Status: AC
  Filled 2014-07-05: qty 1

## 2014-07-05 MED ORDER — PHENYLEPHRINE HCL 10 MG/ML IJ SOLN
INTRAMUSCULAR | Status: DC | PRN
Start: 2014-07-05 — End: 2014-07-05
  Administered 2014-07-05: 80 ug via INTRAVENOUS

## 2014-07-05 MED ORDER — ONDANSETRON HCL 4 MG PO TABS: 4.0000 mg | ORAL_TABLET | Freq: Four times a day (QID) | ORAL | Status: DC | PRN

## 2014-07-05 MED ORDER — ONDANSETRON HCL 4 MG/2ML IJ SOLN
INTRAMUSCULAR | Status: DC | PRN
  Administered 2014-07-05: 4 mg via INTRAVENOUS

## 2014-07-05 MED ORDER — BUPIVACAINE-EPINEPHRINE (PF) 0.25% -1:200000 IJ SOLN
INTRAMUSCULAR | Status: AC
  Filled 2014-07-05: qty 30

## 2014-07-05 MED ORDER — MENTHOL 3 MG MT LOZG: 1.0000 | LOZENGE | OROMUCOSAL | Status: DC | PRN

## 2014-07-05 MED ORDER — OXYCODONE-ACETAMINOPHEN 5-325 MG PO TABS: 1.0000 | ORAL_TABLET | ORAL | Status: DC | PRN

## 2014-07-05 MED ORDER — FENTANYL CITRATE 0.05 MG/ML IJ SOLN
INTRAMUSCULAR | Status: DC | PRN
  Administered 2014-07-05: 50 ug via INTRAVENOUS
  Administered 2014-07-05: 100 ug via INTRAVENOUS
  Administered 2014-07-05: 50 ug via INTRAVENOUS

## 2014-07-05 MED ORDER — MIDAZOLAM HCL 5 MG/5ML IJ SOLN
INTRAMUSCULAR | Status: DC | PRN
  Administered 2014-07-05: 2 mg via INTRAVENOUS

## 2014-07-05 MED ORDER — ROCURONIUM BROMIDE 100 MG/10ML IV SOLN
INTRAVENOUS | Status: DC | PRN
  Administered 2014-07-05: 50 mg via INTRAVENOUS

## 2014-07-05 MED ORDER — OXYCODONE-ACETAMINOPHEN 5-325 MG PO TABS
1.0000 | ORAL_TABLET | ORAL | Status: DC | PRN
  Administered 2014-07-05 – 2014-07-06 (×5): 2 via ORAL
  Filled 2014-07-05 (×6): qty 2

## 2014-07-05 MED ORDER — NIACIN 500 MG PO TABS
500.0000 mg | ORAL_TABLET | Freq: Every day | ORAL | Status: DC
  Administered 2014-07-05: 500 mg via ORAL
  Filled 2014-07-05 (×3): qty 1

## 2014-07-05 MED ORDER — IRBESARTAN 300 MG PO TABS
300.0000 mg | ORAL_TABLET | Freq: Every day | ORAL | Status: DC
  Administered 2014-07-05 – 2014-07-07 (×3): 300 mg via ORAL
  Filled 2014-07-05 (×3): qty 1

## 2014-07-05 MED ORDER — PROPOFOL 10 MG/ML IV BOLUS
INTRAVENOUS | Status: DC | PRN
  Administered 2014-07-05: 150 mg via INTRAVENOUS

## 2014-07-05 MED ORDER — MELOXICAM 15 MG PO TABS
15.0000 mg | ORAL_TABLET | ORAL | Status: DC
  Administered 2014-07-07: 15 mg via ORAL
  Filled 2014-07-05: qty 1

## 2014-07-05 SURGICAL SUPPLY — 59 items
BLADE SAG 18X100X1.27 (BLADE) ×2 IMPLANT
CAPT SHLDR REVTOTAL 1 ×1 IMPLANT
COVER SURGICAL LIGHT HANDLE (MISCELLANEOUS) ×2 IMPLANT
DRAPE INCISE IOBAN 66X45 STRL (DRAPES) ×4 IMPLANT
DRAPE U-SHAPE 47X51 STRL (DRAPES) ×2 IMPLANT
DRAPE X-RAY CASS 24X20 (DRAPES) IMPLANT
DRSG ADAPTIC 3X8 NADH LF (GAUZE/BANDAGES/DRESSINGS) ×2 IMPLANT
DRSG PAD ABDOMINAL 8X10 ST (GAUZE/BANDAGES/DRESSINGS) ×2 IMPLANT
DURAPREP 26ML APPLICATOR (WOUND CARE) ×2 IMPLANT
ELECT BLADE 4.0 EZ CLEAN MEGAD (MISCELLANEOUS) ×2
ELECT NDL TIP 2.8 STRL (NEEDLE) ×1 IMPLANT
ELECT NEEDLE TIP 2.8 STRL (NEEDLE) ×2 IMPLANT
ELECT REM PT RETURN 9FT ADLT (ELECTROSURGICAL) ×2
ELECTRODE BLDE 4.0 EZ CLN MEGD (MISCELLANEOUS) ×1 IMPLANT
ELECTRODE REM PT RTRN 9FT ADLT (ELECTROSURGICAL) ×1 IMPLANT
GAUZE SPONGE 4X4 12PLY STRL (GAUZE/BANDAGES/DRESSINGS) ×2 IMPLANT
GLOVE BIOGEL PI ORTHO PRO 7.5 (GLOVE) ×1
GLOVE BIOGEL PI ORTHO PRO SZ8 (GLOVE) ×1
GLOVE ORTHO TXT STRL SZ7.5 (GLOVE) ×2 IMPLANT
GLOVE PI ORTHO PRO STRL 7.5 (GLOVE) ×1 IMPLANT
GLOVE PI ORTHO PRO STRL SZ8 (GLOVE) ×1 IMPLANT
GLOVE SURG ORTHO 8.5 STRL (GLOVE) ×2 IMPLANT
GOWN STRL REUS W/ TWL LRG LVL3 (GOWN DISPOSABLE) ×1 IMPLANT
GOWN STRL REUS W/ TWL XL LVL3 (GOWN DISPOSABLE) ×2 IMPLANT
GOWN STRL REUS W/TWL LRG LVL3 (GOWN DISPOSABLE) ×2
GOWN STRL REUS W/TWL XL LVL3 (GOWN DISPOSABLE) ×4
HANDPIECE INTERPULSE COAX TIP (DISPOSABLE)
KIT BASIN OR (CUSTOM PROCEDURE TRAY) ×2 IMPLANT
KIT ROOM TURNOVER OR (KITS) ×2 IMPLANT
MANIFOLD NEPTUNE II (INSTRUMENTS) ×2 IMPLANT
NDL 1/2 CIR MAYO (NEEDLE) ×1 IMPLANT
NDL HYPO 25GX1X1/2 BEV (NEEDLE) ×1 IMPLANT
NEEDLE 1/2 CIR MAYO (NEEDLE) ×2 IMPLANT
NEEDLE HYPO 25GX1X1/2 BEV (NEEDLE) ×2 IMPLANT
NS IRRIG 1000ML POUR BTL (IV SOLUTION) ×2 IMPLANT
PACK SHOULDER (CUSTOM PROCEDURE TRAY) ×2 IMPLANT
PAD ARMBOARD 7.5X6 YLW CONV (MISCELLANEOUS) ×4 IMPLANT
SET HNDPC FAN SPRY TIP SCT (DISPOSABLE) IMPLANT
SLING ARM LRG ADULT FOAM STRAP (SOFTGOODS) IMPLANT
SLING ARM MED ADULT FOAM STRAP (SOFTGOODS) IMPLANT
SPONGE LAP 18X18 X RAY DECT (DISPOSABLE) ×2 IMPLANT
SPONGE LAP 4X18 X RAY DECT (DISPOSABLE) ×2 IMPLANT
STRIP CLOSURE SKIN 1/2X4 (GAUZE/BANDAGES/DRESSINGS) ×2 IMPLANT
SUCTION FRAZIER TIP 10 FR DISP (SUCTIONS) ×2 IMPLANT
SUT FIBERWIRE #2 38 T-5 BLUE (SUTURE) ×10
SUT MNCRL AB 4-0 PS2 18 (SUTURE) ×2 IMPLANT
SUT VIC AB 0 CT1 27 (SUTURE) ×2
SUT VIC AB 0 CT1 27XBRD ANBCTR (SUTURE) IMPLANT
SUT VIC AB 2-0 CT1 27 (SUTURE) ×2
SUT VIC AB 2-0 CT1 TAPERPNT 27 (SUTURE) ×1 IMPLANT
SUTURE FIBERWR #2 38 T-5 BLUE (SUTURE) ×2 IMPLANT
SYR CONTROL 10ML LL (SYRINGE) ×2 IMPLANT
TOWEL OR 17X24 6PK STRL BLUE (TOWEL DISPOSABLE) ×2 IMPLANT
TOWEL OR 17X26 10 PK STRL BLUE (TOWEL DISPOSABLE) ×2 IMPLANT
TOWER CARTRIDGE SMART MIX (DISPOSABLE) IMPLANT
TRAY FOLEY CATH 16FRSI W/METER (SET/KITS/TRAYS/PACK) ×1 IMPLANT
TUBE CONNECTING 12X1/4 (SUCTIONS) ×1 IMPLANT
WATER STERILE IRR 1000ML POUR (IV SOLUTION) ×2 IMPLANT
YANKAUER SUCT BULB TIP NO VENT (SUCTIONS) ×1 IMPLANT

## 2014-07-05 NOTE — Progress Notes (Signed)
Orthopedic Tech Progress Note Patient Details:  Tara Moreno 1945-05-23 [id number]  Ortho Devices Type of Ortho Device: Shoulder abduction pillow Ortho Device/Splint Interventions: Ordered As ordered by Dr. Thermon Leyland, Tara Moreno 07/05/2014, 9:36 AM

## 2014-07-05 NOTE — Interval H&P Note (Signed)
History and Physical Interval Note:  07/05/2014 7:22 AM  Tara Moreno  has presented today for surgery, with the diagnosis of left shoulder osteoarthritis and rotator cuff insufficiency  The various methods of treatment have been discussed with the patient and family. After consideration of risks, benefits and other options for treatment, the patient has consented to  Procedure(s): LEFT REVERSE SHOULDER ARTHROPLASTY (Left) as a surgical intervention .  The patient's history has been reviewed, patient examined, no change in status, stable for surgery.  I have reviewed the patient's chart and labs.  Questions were answered to the patient's satisfaction.     Jennice Renegar,STEVEN R

## 2014-07-05 NOTE — Anesthesia Procedure Notes (Addendum)
Anesthesia Regional Block:  Interscalene brachial plexus block  Pre-Anesthetic Checklist: ,, timeout performed, Correct Patient, Correct Site, Correct Laterality, Correct Procedure, Correct Position, site marked, Risks and benefits discussed, pre-op evaluation,  At surgeon's request and post-op pain management  Laterality: Left  Prep: Maximum Sterile Barrier Precautions used and chloraprep       Needles:  Injection technique: Single-shot  Needle Type: Echogenic Stimulator Needle     Needle Length: 5cm 5 cm Needle Gauge: 22 and 22 G    Additional Needles:  Procedures: ultrasound guided (picture in chart) and nerve stimulator Interscalene brachial plexus block  Nerve Stimulator or Paresthesia:  Response: Biceps response,   Additional Responses:   Narrative:  Start time: 07/05/2014 7:05 AM End time: 07/05/2014 7:15 AM Injection made incrementally with aspirations every 5 mL.  Performed by: Personally  Anesthesiologist: Roderic Palau E  Additional Notes: 2% Lidocaine skin wheel.    Procedure Name: Intubation Date/Time: 07/05/2014 7:39 AM Performed by: Melina Copa, Ahriana Gunkel R Pre-anesthesia Checklist: Patient identified, Emergency Drugs available, Suction available, Patient being monitored and Timeout performed Patient Re-evaluated:Patient Re-evaluated prior to inductionOxygen Delivery Method: Circle system utilized Preoxygenation: Pre-oxygenation with 100% oxygen Intubation Type: IV induction Ventilation: Mask ventilation without difficulty Laryngoscope Size: Mac and 3 Tube size: 7.5 mm Number of attempts: 1 Airway Equipment and Method: Stylet Placement Confirmation: ETT inserted through vocal cords under direct vision and positive ETCO2 Secured at: 21 cm Tube secured with: Tape Dental Injury: Teeth and Oropharynx as per pre-operative assessment

## 2014-07-05 NOTE — Anesthesia Postprocedure Evaluation (Signed)
  Anesthesia Post-op Note  Patient: Tara Moreno  Procedure(s) Performed: Procedure(s): LEFT REVERSE SHOULDER ARTHROPLASTY (Left)  Patient Location: PACU  Anesthesia Type:General and block  Level of Consciousness: awake and alert   Airway and Oxygen Therapy: Patient Spontanous Breathing  Post-op Pain: mild  Post-op Assessment: Post-op Vital signs reviewed, Patient's Cardiovascular Status Stable and Respiratory Function Stable  Post-op Vital Signs: Reviewed  Filed Vitals:   07/05/14 1039  BP:   Pulse: 79  Temp:   Resp: 14    Complications: No apparent anesthesia complications

## 2014-07-05 NOTE — Plan of Care (Signed)
Problem: Consults Goal: Diagnosis - Shoulder Surgery Total Shoulder Arthroplasty Left     

## 2014-07-05 NOTE — Anesthesia Preprocedure Evaluation (Addendum)
Anesthesia Evaluation  Patient identified by MRN, date of birth, ID band Patient awake    Reviewed: Allergy & Precautions, H&P , NPO status , Patient's Chart, lab work & pertinent test results  History of Anesthesia Complications (+) PONV  Airway Mallampati: II  TM Distance: >3 FB Neck ROM: Full    Dental no notable dental hx. (+) Teeth Intact, Dental Advisory Given   Pulmonary neg pulmonary ROS,  breath sounds clear to auscultation  Pulmonary exam normal       Cardiovascular hypertension, Pt. on medications Rhythm:Regular Rate:Normal     Neuro/Psych negative neurological ROS  negative psych ROS   GI/Hepatic Neg liver ROS, GERD-  Medicated and Controlled,  Endo/Other  Morbid obesity  Renal/GU negative Renal ROS  negative genitourinary   Musculoskeletal  (+) Arthritis -, Osteoarthritis,    Abdominal   Peds  Hematology negative hematology ROS (+)   Anesthesia Other Findings   Reproductive/Obstetrics negative OB ROS                           Anesthesia Physical Anesthesia Plan  ASA: II  Anesthesia Plan: General and Regional   Post-op Pain Management:    Induction: Intravenous  Airway Management Planned: Oral ETT  Additional Equipment:   Intra-op Plan:   Post-operative Plan: Extubation in OR  Informed Consent: I have reviewed the patients History and Physical, chart, labs and discussed the procedure including the risks, benefits and alternatives for the proposed anesthesia with the patient or authorized representative who has indicated his/her understanding and acceptance.   Dental advisory given  Plan Discussed with: CRNA  Anesthesia Plan Comments:        Anesthesia Quick Evaluation

## 2014-07-05 NOTE — Discharge Instructions (Signed)
Ice to the shoulder as much as you can.  Light use of the left shoulder for now.  No pushing, pulling, or lifting!  Keep the incision covered and clean and dry for one week, then ok to shower and get it wet.  Do exercises every hour while awake.  Lap slides especially.  Follow up in two weeks  [phone]

## 2014-07-05 NOTE — Transfer of Care (Signed)
Immediate Anesthesia Transfer of Care Note  Patient: Tara Moreno  Procedure(s) Performed: Procedure(s): LEFT REVERSE SHOULDER ARTHROPLASTY (Left)  Patient Location: PACU  Anesthesia Type:GA combined with regional for post-op pain  Level of Consciousness: awake, alert  and oriented  Airway & Oxygen Therapy: Patient Spontanous Breathing and Patient connected to nasal cannula oxygen  Post-op Assessment: Report given to RN, Post -op Vital signs reviewed and stable and Patient moving all extremities  Post vital signs: Reviewed and stable  Last Vitals:  Filed Vitals:   07/05/14 0554  BP: 171/83  Pulse: 90  Temp: 37.1 C  Resp: 20    Complications: No apparent anesthesia complications

## 2014-07-05 NOTE — Brief Op Note (Signed)
07/05/2014  10:08 AM  PATIENT:  Tara Moreno  69 y.o. female  PRE-OPERATIVE DIAGNOSIS:  left shoulder osteoarthritis and rotator cuff insufficiency  POST-OPERATIVE DIAGNOSIS:  left shoulder osteoarthritis and rotator cuff insufficiency  PROCEDURE:  Procedure(s): LEFT REVERSE SHOULDER ARTHROPLASTY (Left) DePuy Delta Xtend   SURGEON:  Surgeon(s) and Role:    * Augustin Schooling, MD - Primary  PHYSICIAN ASSISTANT:   ASSISTANTS: Ventura Bruns, PA-C   ANESTHESIA:   regional and general  EBL:  Total I/O In: 750 [I.V.:750] Out: -   BLOOD ADMINISTERED:none  DRAINS: none   LOCAL MEDICATIONS USED:  MARCAINE     SPECIMEN:  No Specimen  DISPOSITION OF SPECIMEN:  N/A  COUNTS:  YES  TOURNIQUET:  * No tourniquets in log *  DICTATION: .Other Dictation: Dictation Number [id number]  PLAN OF CARE: Admit to inpatient   PATIENT DISPOSITION:  PACU - hemodynamically stable.   Delay start of Pharmacological VTE agent (>24hrs) due to surgical blood loss or risk of bleeding: not applicable

## 2014-07-05 NOTE — Progress Notes (Signed)
Utilization review completed.  

## 2014-07-06 LAB — BASIC METABOLIC PANEL
Anion gap: 7 (ref 5–15)
BUN: 19 mg/dL (ref 6–23)
CALCIUM: 8.8 mg/dL (ref 8.4–10.5)
CO2: 30 mmol/L (ref 19–32)
Chloride: 100 mmol/L (ref 96–112)
Creatinine, Ser: 0.85 mg/dL (ref 0.50–1.10)
GFR calc Af Amer: 81 mL/min — ABNORMAL LOW (ref >90)
GFR calc non Af Amer: 70 mL/min — ABNORMAL LOW (ref >90)
Glucose, Bld: 103 mg/dL — ABNORMAL HIGH (ref 70–99)
Potassium: 4.1 mmol/L (ref 3.5–5.1)
SODIUM: 137 mmol/L (ref 135–145)

## 2014-07-06 LAB — HEMOGLOBIN AND HEMATOCRIT, BLOOD
HCT: 33.4 % — ABNORMAL LOW (ref 36.0–46.0)
Hemoglobin: 10.8 g/dL — ABNORMAL LOW (ref 12.0–15.0)

## 2014-07-06 NOTE — Op Note (Signed)
NAMEJETAIME, PINNIX              ACCOUNT NO.:  [id number]  MEDICAL RECORD NO.:  [id number]  LOCATION:  5N27C                        FACILITY:  [facility]  PHYSICIAN:  Doran Heater. Veverly Fells, M.D. DATE OF BIRTH:  [birth year]  DATE OF PROCEDURE:  07/05/2014 DATE OF DISCHARGE:                              OPERATIVE REPORT   PREOPERATIVE DIAGNOSIS:  Left shoulder osteoarthritis and rotator cuff insufficiency.  POSTOPERATIVE DIAGNOSIS:  Left shoulder osteoarthritis and rotator cuff insufficiency.  PROCEDURE PERFORMED:  Left shoulder reverse total shoulder arthroplasty using DePuy Delta Xtend prosthesis.  ATTENDING SURGEON:  Doran Heater. Veverly Fells, M.D.  ASSISTANT:  Abbott Pao. Dixon, PA-C, who scrubbed the entire procedure and necessary for satisfactory completion of surgery.  ANESTHESIA:  General anesthesia was used plus interscalene block.  ESTIMATED BLOOD LOSS:  Less than 100 mL.  FLUID REPLACEMENT:  1500 mL crystalloid.  INSTRUMENT COUNTS:  Correct.  COMPLICATIONS:  There were no complications.  ANTIBIOTICS:  Perioperative antibiotics were given.  INDICATIONS:  The patient is a 69 year old female with disabling pain, severe loss of function in her left shoulder secondary to rotator cuff insufficiency, and end-stage arthritis. The patient has bone-on-bone on x-rays and has significant doubled deformity in the glenoid with posterior erosion and wear.  Due to her progressive pain despite conservative management, we discussed options for management. She elected to proceed with reverse shoulder arthroplasty on the left.  She has done really well with the same operation on her right shoulder.  She recently retired as a Careers adviser.  Risks and benefits were discussed. Informed consent obtained.  DESCRIPTION OF PROCEDURE:  After an adequate level of anesthesia achieved, the patient was positioned in modified beach-chair position. Left shoulder correctly identified and sterilely prepped and  draped in usual manner.  Time-out called.  Deltopectoral approach was utilized starting at the coracoid process extending down to the anterior humerus with a fresh 10 blade scalpel.  We identified the cephalic vein, took it laterally with the deltoid and pectoralis taken medially, conjoint tendon identified and retracted medially.  Subscapularis released subperiosteally off the lesser tuberosity and tagged with #2 FiberWire suture for repair at the end.  The inferior capsule released off the inferior neck with progressive external rotation, large osteophyte noted inferiorly, bone-on-bone noted.  We then went ahead and released some very atrophic looking and stiff rotator cuff from the supraspinatus and infraspinatus area leaving teres minor intact and this allowed [place] to deliver the humeral head out of the wound.  We entered the proximal humerus using a 6 mm followed by 8 and 10 and eventually up to 12 mm reamer for the canal.  We then did our metaphyseal preparation for the epi one left shoulder.  Once we had reamed for the epi one left, we placed the trial stem in place, wand up it with the 12 body in the epi one left, set on the usual 0 degree setting, it seemed to me that the way that the proximal portion of the stem and humeral implant sat in there that there was a 1 mm gap anteriorly.  We went ahead then and switched to the central metaphyseal modular component and that fit in perfectly, so we  had that set on 0.  Once we had that impacted in place to the appropriate depth with secure fixation, we retracted the humerus posteriorly removing the biceps, the labrum, and the capsule.  We were careful to protect the axillary nerve.  Once we had 360 degree exposure of the glenoid, we then went ahead and noted the deformity of the glenoid which was severe.  There was significant prominence to the anterior glenoid which was also somewhat thinned and sticking up ____ [place], we did find the  inferior scapular neck to get our reference point for the 6 o'clock position.  I could also palpate the anterior scapular body over the front of the glenoid as well.  Used a rongeur to remove some of that anterior bone.  We then found our center point and with the appropriate alignment and version with the guide pin, we then reamed for the metaglene.  Once we were pleased with our metaglene preparation, we drilled out the central peg hole and then irrigated thoroughly and then impacted the real metaglene into position with the 4- hole strategically placed for best purchase with our screws.  We were able to get a 36 inferior, a 30 up into the base of the coracoid, and then 18 nonlocked posteriorly, locked the superior and inferior screws, placed a 38 standard glenoid sphere and screwed that in position, and we were happy with its coverage and its placement inferiorly on the glenoid.  Checked to make sure the axillary nerve was free and clear which was.  We then reduced the shoulder with 38+ 6 poly, we were happy with that soft tissue balancing.  No gapping with sulcus.  No gapping with external rotation, and this was a very nice stable shoulder tight conjoint.  We removed the trial components, thoroughly irrigated, removed the trial humeral component.  Placed 3 drill holes in the lesser tuberosity and placed #2 FiberWire suture for repair of the subscapularis, then used impaction grafting technique with available bone graft from the head and the real HA-coated stem, size 12 body, it was a size 1 center, not eccentric, and set on 0 degrees HA-modular component.  We impacted that into position, with excellent bony fixation and then went ahead and placed our +6 real 38+ 6 poly, impacted that in place, reduced the shoulder with a nice pop, had excellent tension on her conjoint, no gapping or whatsoever, ranged the shoulder, no tethering.  We had removed the stiff and atrophic rotator cuff  tissue, the supraspinatus and infraspinatus tissue, teres minor felt to be more normal in the back and we then repaired the subscap anatomically the both in the front.  We had no tethering, there was soft tissue impingement and excellent stability with the implant, and I felt good about being able to get that subscap back.  We then irrigated thoroughly and repaired the deltopectoral interval with 0 Vicryl suture, followed by 2-0 Vicryl subcutaneous closure, and 4-0 Monocryl for skin.  Steri- Strips applied followed by sterile dressing.  The patient tolerated the surgery well.     Doran Heater. Veverly Fells, M.D.     SRN/MEDQ  D:  07/05/2014  T:  07/06/2014  Job:  [id number]

## 2014-07-06 NOTE — Progress Notes (Signed)
Orthopedics Progress Note  Subjective: I am feeling better this morning  Objective:  Filed Vitals:   07/06/14 0537  BP: 107/57  Pulse: 87  Temp: 98.9 F (37.2 C)  Resp:     General: Awake and alert  Musculoskeletal: left shoulder dressing CDI, NVI Neurovascularly intact  Lab Results  Component Value Date   WBC 6.4 06/21/2014   HGB 13.6 06/21/2014   HCT 41.4 06/21/2014   MCV 87.3 06/21/2014   PLT 317 06/21/2014       Component Value Date/Time   NA 136 06/21/2014 1355   K 4.2 06/21/2014 1355   CL 102 06/21/2014 1355   CO2 24 06/21/2014 1355   GLUCOSE 99 06/21/2014 1355   BUN 17 06/21/2014 1355   CREATININE 0.94 06/21/2014 1355   CALCIUM 9.4 06/21/2014 1355   GFRNONAA 62* 06/21/2014 1355   GFRAA 72* 06/21/2014 1355    No results found for: INR, PROTIME  Assessment/Plan: POD #1 s/p Procedure(s): LEFT REVERSE SHOULDER ARTHROPLASTY Stable after surgery. OT this morning.  Plan d/c Sunday Limit WB with the left arm, ADLs ok  Doran Heater. Veverly Fells, MD 07/06/2014 5:58 AM

## 2014-07-06 NOTE — Evaluation (Signed)
Occupational Therapy Evaluation and Discharge Patient Details Name: Tara Moreno MRN: [id number] DOB: Jul 15, 1945 Today's Date: 07/06/2014    History of Present Illness left reverse total shoulder, had right reverse total shoulder in Oct 2015. PMHx: HTN, arthritis, and GERD. She is to have Bil knee surgery in about 3 months from now at [facility]   This 69 yo female admitted with above presents to acute OT with all education completed (pt had had the same procedure on right shoulder in Oct 2015 so she already knew and remembered most of what she could and could not do where her left arm now). Acute OT will sign off.    Follow Up Recommendations   (pt wants same therapist from [facility] that she had last time for other shoulder)    Equipment Recommendations  None recommended by OT       Precautions / Restrictions Precautions Precautions: None;Shoulder Shoulder Interventions: Shoulder sling/immobilizer;Off for dressing/bathing/exercises (and when in bed, but needs to have in on when up and about per pt per what Dr. Veverly Fells told her) Required Braces or Orthoses: Sling Restrictions Weight Bearing Restrictions: Yes LUE Weight Bearing: Non weight bearing      Mobility Bed Mobility Overal bed mobility: Modified Independent (HOB up for supine<>sit)                Transfers Overall transfer level: Needs assistance Equipment used: None Transfers: Sit to/from Stand Sit to Stand: Supervision                                   Pertinent Vitals/Pain Pain Assessment: No/denies pain     Hand Dominance Right   Extremity/Trunk Assessment Upper Extremity Assessment Upper Extremity Assessment: LUE deficits/detail LUE Deficits / Details: reverse TSA  this admission and block is almost worn off LUE Coordination: decreased gross motor           Communication Communication Communication: No difficulties   Cognition Arousal/Alertness:  Awake/alert Behavior During Therapy: WFL for tasks assessed/performed Overall Cognitive Status: Within Functional Limits for tasks assessed                           Shoulder Instructions Shoulder Instructions Donning/doffing shirt without moving shoulder:  (Pt aware that she can move arm anyway she wants for BADLs, but right know pain is keeping from doing this) Method for sponge bathing under operated UE:  (Pt reports she is aware of how to do this due to had had other shoulder done in Oct of last year and had just finished bathing not long before I came to see her) Donning/doffing sling/immobilizer: Modified independent Correct positioning of sling/immobilizer: Modified independent Pendulum exercises (written home exercise program):  (NA) ROM for elbow, wrist and digits of operated UE: Independent Sling wearing schedule (on at all times/off for ADL's): Independent Dressing change:  (NA) Positioning of UE while sleeping: [place] expects to be discharged to:: Private residence Living Arrangements: Spouse/significant other Available Help at Discharge: Family;Friend(s);Available PRN/intermittently Type of Home: House Home Access: Stairs to enter   Entrance Stairs-Rails: Can reach both Home Layout: One level               Home Equipment: None          Prior Functioning/Environment Level of Independence: Independent  OT Diagnosis: Generalized weakness;Acute pain   OT Problem List: Decreased strength;Decreased range of motion;Impaired UE functional use;Obesity      OT Goals(Current goals can be found in the care plan section) Acute Rehab OT Goals Patient Stated Goal: home tomorrow  OT Frequency:                End of Session Equipment Utilized During Treatment:  (None) Nurse Communication:  (Pt back in bed)  Activity Tolerance: Patient tolerated treatment well Patient left: in bed;with call bell/phone  within reach   Time: 0931-1216 OT Time Calculation (min): 32 min Charges:  OT General Charges $OT Visit: 1 Procedure OT Evaluation $Initial OT Evaluation Tier I: 1 Procedure OT Treatments $Self Care/Home Management : 8-22 mins  Almon Register [phone] 07/06/2014, 11:49 AM

## 2014-07-07 NOTE — Progress Notes (Signed)
Tara Moreno  MRN: [id number] DOB/Age: Sep 23, 1945 69 y.o. Physician: Rada Hay Procedure: Procedure(s) (LRB): LEFT REVERSE SHOULDER ARTHROPLASTY (Left)     Subjective: Overall doing well and ready for DC, request home health as she had this last time  Vital Signs Temp:  [98.6 F (37 C)-100.3 F (37.9 C)] 99.3 F (37.4 C) (02/28 0530) Pulse Rate:  [84-94] 84 (02/28 0530) Resp:  [16-18] 16 (02/28 0530) BP: (117-140)/(52-73) 117/52 mmHg (02/28 0530) SpO2:  [93 %-100 %] 93 % (02/28 0530)  Lab Results  Recent Labs  07/06/14 0816  HGB 10.8*  HCT 33.4*   BMET  Recent Labs  07/06/14 0816  NA 137  K 4.1  CL 100  CO2 30  GLUCOSE 103*  BUN 19  CREATININE 0.85  CALCIUM 8.8   No results found for: INR   Exam Incision dry, dressing will be changed [place] home today Home health ordered  [facility] for Dr.Kevin Supple 07/07/2014, 10:13 AM

## 2014-07-07 NOTE — Care Management Note (Signed)
    Page 1 of 1   07/07/2014     10:35:15 AM CARE MANAGEMENT NOTE 07/07/2014  Patient:  Tara Moreno,Tara Moreno   Account Number:  [id number]  Date Initiated:  07/07/2014  Documentation initiated by:  [facility], [place]  Subjective/Objective Assessment:   adm: LEFT REVERSE SHOULDER ARTHROPLASTY (Left)     Action/Plan:   discharge planning   Anticipated DC Date:  07/07/2014   Anticipated DC Plan:  [place]  CM consult      [facility] Choice  HOME HEALTH   Choice offered to / List presented to:  C-1 Patient        [place] arranged  HH-2 PT  HH-3 OT      Status of service:  Completed, signed off Medicare Important Message given?   (If response is "NO", the following Medicare IM given date fields will be blank) Date Medicare IM given:   Medicare IM given by:   Date Additional Medicare IM given:   Additional Medicare IM given by:    Discharge Disposition:  [place]  Per UR Regulation:    If discussed at Long Length of Stay Meetings, dates discussed:    Comments:  07/07/14 CM received call from RN, Butch Penny reqeusting I make arrangements with [facility] and request previous PT person for pt dishcarging today. CM spoke with pt to confirm request.  Pt confirms request.  referral called to [facility] rep, Lecretia with request for previous therapist.  No other CM needs were communicated.  Mariane Masters, BSN, CM [phone].

## 2014-07-08 ENCOUNTER — Encounter (HOSPITAL_COMMUNITY): Payer: Self-pay | Admitting: Orthopedic Surgery

## 2014-07-22 NOTE — Discharge Summary (Signed)
Physician Discharge Summary   Patient ID: KINSLEY HOLDERMAN MRN: [id number] DOB/AGE: 09-Jan-1946 69 y.o.  Admit date: 07/05/2014 Discharge date: 07/22/2014  Admission Diagnoses:  Active Problems:   S/P shoulder replacement   Discharge Diagnoses:  Same   Surgeries: Procedure(s): LEFT REVERSE SHOULDER ARTHROPLASTY on 07/05/2014   Consultants: OT  Discharged Condition: Stable  Hospital Course: RETA NORGREN is an 69 y.o. female who was admitted 07/05/2014 with a chief complaint of left shoulder pain, and found to have a diagnosis of left shoulder rotator cuff tear arthropathy.  They were brought to the operating room on 07/05/2014 and underwent the above named procedures.    The patient had an uncomplicated hospital course and was stable for discharge.  Recent vital signs:  Filed Vitals:   07/07/14 0530  BP: 117/52  Pulse: 84  Temp: 99.3 F (37.4 C)  Resp: 16    Recent laboratory studies:  Results for orders placed or performed during the hospital encounter of 07/05/14  Hemoglobin and hematocrit, blood  Result Value Ref Range   Hemoglobin 10.8 (L) 12.0 - 15.0 g/dL   HCT 33.4 (L) 36.0 - 35.0 %  Basic metabolic panel  Result Value Ref Range   Sodium 137 135 - 145 mmol/L   Potassium 4.1 3.5 - 5.1 mmol/L   Chloride 100 96 - 112 mmol/L   CO2 30 19 - 32 mmol/L   Glucose, Bld 103 (H) 70 - 99 mg/dL   BUN 19 6 - 23 mg/dL   Creatinine, Ser 0.85 0.50 - 1.10 mg/dL   Calcium 8.8 8.4 - 10.5 mg/dL   GFR calc non Af Amer 70 (L) >90 mL/min   GFR calc Af Amer 81 (L) >90 mL/min   Anion gap 7 5 - 15    Discharge Medications:     Medication List    TAKE these medications        atorvastatin 10 MG tablet  Commonly known as:  LIPITOR  Take 10 mg by mouth daily.     AZOR 10-40 MG per tablet  Generic drug:  amLODipine-olmesartan  Take 1 tablet by mouth daily.     Fish Oil 1000 MG Caps  Take 3,000 mg by mouth daily.     GREEN TEA EXTRACT PO  Take 1 capsule by mouth  daily.     meloxicam 15 MG tablet  Commonly known as:  MOBIC  Take 15 mg by mouth See admin instructions. Sunday Wednesday Thursdays     methocarbamol 500 MG tablet  Commonly known as:  ROBAXIN  Take 1 tablet (500 mg total) by mouth 3 (three) times daily as needed.     methocarbamol 500 MG tablet  Commonly known as:  ROBAXIN  Take 1 tablet (500 mg total) by mouth 3 (three) times daily as needed.     MILK THISTLE PO  Take 1 capsule by mouth daily.     multivitamin with minerals Tabs tablet  Take 1 tablet by mouth daily.     niacin 500 MG tablet  Take 500 mg by mouth daily.     OSTEO BI-FLEX ADV TRIPLE ST PO  Take by mouth daily.     oxyCODONE-acetaminophen 5-325 MG per tablet  Commonly known as:  ROXICET  Take 1-2 tablets by mouth every 4 (four) hours as needed for severe pain.     oxyCODONE-acetaminophen 5-325 MG per tablet  Commonly known as:  ROXICET  Take 1-2 tablets by mouth every 4 (four) hours as needed for severe  pain.     pantoprazole 40 MG tablet  Commonly known as:  PROTONIX  Take 40 mg by mouth daily.     PROBIOTIC DAILY PO  Take by mouth daily.     RESTASIS OP  Place 1 drop into both eyes daily as needed. For dry eyes     UBIQUINOL PO  Take 100 mg by mouth daily.        Diagnostic Studies: Dg Shoulder Left  07/05/2014   CLINICAL DATA:  Postop left shoulder arthroplasty.  EXAM: LEFT SHOULDER - 2+ VIEW  COMPARISON:  None.  FINDINGS: Left shoulder arthroplasty is seen in expected position. No evidence of acute fracture or dislocation. Overlying soft tissue gas noted.  IMPRESSION: Expected postop appearance of left shoulder prosthesis. No fracture, dislocation, or other complication identified.   Electronically Signed   By: Earle Gell M.D.   On: 07/05/2014 12:43    Disposition: 01-Home or Self Care        Follow-up Information    Follow up with Dalaney Needle,STEVEN R, MD. Call in 2 weeks.   Specialty:  Orthopedic Surgery   Why:  [phone]   Contact  information:   [address] [place] [address] [phone]       Follow up with [facility].   Why:  home health physical and occupational therapis and previous therapist has been requested   Contact information:   [address] [place] [address] [phone]        Signed: Augustin Schooling 07/22/2014, 5:47 PM

## 2014-09-17 ENCOUNTER — Ambulatory Visit: Payer: Self-pay | Admitting: Orthopedic Surgery

## 2014-09-17 NOTE — Progress Notes (Signed)
Preoperative surgical orders have been place into the Epic hospital system for Tara Moreno on 09/17/2014, 9:51 AM  by Mickel Crow for surgery on 10-09-2014.  Preop Bilateral Total Knee orders including Epidural per Anesthesia, IV Tylenol, and IV Decadron as long as there are no contraindications to the above medications. [name], PA-C

## 2014-09-30 NOTE — Patient Instructions (Addendum)
Your procedure is scheduled on:  10/09/14  [facility]  Report to [place] at   0900    AM.   Call this number if you have problems the morning of surgery: [phone]        Do not eat food  Or drink :After Midnight. Tuesday NIGHT   Take these medicines the morning of surgery with A SIP OF WATER:   Pantoprazole Contacts, dentures or partial plates, or metal hairpins  can not be worn to surgery. Your family will be responsible for glasses, dentures, hearing aides while you are in surgery  Leave suitcase in the car. After surgery it may be brought to your room.  For patients admitted to the hospital, checkout time is 11:00 AM day of  discharge.         [facility] IS NOT RESPONSIBLE FOR ANY VALUABLES  Patients discharged the day of surgery will not be allowed to drive home. IF going home the day of surgery, you must have a driver and someone to stay with you for the first 24 hours                                                                  [facility] - Preparing for Surgery Before surgery, you can play an important role.  Because skin is not sterile, your skin needs to be as free of germs as possible.  You can reduce the number of germs on your skin by washing with CHG (chlorahexidine gluconate) soap before surgery.  CHG is an antiseptic cleaner which kills germs and bonds with the skin to continue killing germs even after washing. Please DO NOT use if you have an allergy to CHG or antibacterial soaps.  If your skin becomes reddened/irritated stop using the CHG and inform your nurse when you arrive at Short Stay. Do not shave (including legs and underarms) for at least 48 hours prior to the first CHG shower.  You may shave your face/neck. Please follow these instructions carefully:  1.  Shower with CHG Soap the night before surgery and the  morning of Surgery.  2.  If you choose to wash your hair, wash your  hair first as usual with your  normal  shampoo.  3.  After you shampoo, rinse your hair and body thoroughly to remove the  shampoo.                           4.  Use CHG as you would any other liquid soap.  You can apply chg directly  to the skin and wash                       Gently with a scrungie or clean washcloth.  5.  Apply the CHG Soap to your body ONLY FROM THE NECK DOWN.   Do not use on face/ open                           Wound or open sores. Avoid contact with eyes, ears mouth and genitals (private parts).  Wash face,  Genitals (private parts) with your normal soap.             6.  Wash thoroughly, paying special attention to the area where your surgery  will be performed.  7.  Thoroughly rinse your body with warm water from the neck down.  8.  DO NOT shower/wash with your normal soap after using and rinsing off  the CHG Soap.                9.  Pat yourself dry with a clean towel.            10.  Wear clean pajamas.            11.  Place clean sheets on your bed the night of your first shower and do not  sleep with pets. Day of Surgery : Do not apply any lotions/deodorants the morning of surgery.  Please wear clean clothes to the hospital/surgery center.  FAILURE TO FOLLOW THESE INSTRUCTIONS MAY RESULT IN THE CANCELLATION OF YOUR SURGERY PATIENT SIGNATURE_________________________________  NURSE SIGNATURE__________________________________  ________________________________________________________________________  WHAT IS A BLOOD TRANSFUSION? Blood Transfusion Information  A transfusion is the replacement of blood or some of its parts. Blood is made up of multiple cells which provide different functions.  Red blood cells carry oxygen and are used for blood loss replacement.  White blood cells fight against infection.  Platelets control bleeding.  Plasma helps clot blood.  Other blood products are available for specialized needs, such as hemophilia or  other clotting disorders. BEFORE THE TRANSFUSION  Who gives blood for transfusions?   Healthy volunteers who are fully evaluated to make sure their blood is safe. This is blood bank blood. Transfusion therapy is the safest it has ever been in the practice of medicine. Before blood is taken from a donor, a complete history is taken to make sure that person has no history of diseases nor engages in risky social behavior (examples are intravenous drug use or sexual activity with multiple partners). The donor's travel history is screened to minimize risk of transmitting infections, such as malaria. The donated blood is tested for signs of infectious diseases, such as HIV and hepatitis. The blood is then tested to be sure it is compatible with you in order to minimize the chance of a transfusion reaction. If you or a relative donates blood, this is often done in anticipation of surgery and is not appropriate for emergency situations. It takes many days to process the donated blood. RISKS AND COMPLICATIONS Although transfusion therapy is very safe and saves many lives, the main dangers of transfusion include:  1. Getting an infectious disease. 2. Developing a transfusion reaction. This is an allergic reaction to something in the blood you were given. Every precaution is taken to prevent this. The decision to have a blood transfusion has been considered carefully by your caregiver before blood is given. Blood is not given unless the benefits outweigh the risks. AFTER THE TRANSFUSION  Right after receiving a blood transfusion, you will usually feel much better and more energetic. This is especially true if your red blood cells have gotten low (anemic). The transfusion raises the level of the red blood cells which carry oxygen, and this usually causes an energy increase.  The nurse administering the transfusion will monitor you carefully for complications. HOME CARE INSTRUCTIONS  No special instructions are  needed after a transfusion. You may find your energy is better. Speak with your caregiver about any  limitations on activity for underlying diseases you may have. SEEK MEDICAL CARE IF:   Your condition is not improving after your transfusion.  You develop redness or irritation at the intravenous (IV) site. SEEK IMMEDIATE MEDICAL CARE IF:  Any of the following symptoms occur over the next 12 hours:  Shaking chills.  You have a temperature by mouth above 102 F (38.9 C), not controlled by medicine.  Chest, back, or muscle pain.  People around you feel you are not acting correctly or are confused.  Shortness of breath or difficulty breathing.  Dizziness and fainting.  You get a rash or develop hives.  You have a decrease in urine output.  Your urine turns a dark color or changes to pink, red, or brown. Any of the following symptoms occur over the next 10 days:  You have a temperature by mouth above 102 F (38.9 C), not controlled by medicine.  Shortness of breath.  Weakness after normal activity.  The white part of the eye turns yellow (jaundice).  You have a decrease in the amount of urine or are urinating less often.  Your urine turns a dark color or changes to pink, red, or brown. Document Released: 04/23/2000 Document Revised: 07/19/2011 Document Reviewed: 12/11/2007 ExitCare Patient Information 2014 [place].  _______________________________________________________________________  Incentive Spirometer  An incentive spirometer is a tool that can help keep your lungs clear and active. This tool measures how well you are filling your lungs with each breath. Taking long deep breaths may help reverse or decrease the chance of developing breathing (pulmonary) problems (especially infection) following:  A long period of time when you are unable to move or be active. BEFORE THE PROCEDURE   If the spirometer includes an indicator to show your best effort, your  nurse or respiratory therapist will set it to a desired goal.  If possible, sit up straight or lean slightly forward. Try not to slouch.  Hold the incentive spirometer in an upright position. INSTRUCTIONS FOR USE  3. Sit on the edge of your bed if possible, or sit up as far as you can in bed or on a chair. 4. Hold the incentive spirometer in an upright position. 5. Breathe out normally. 6. Place the mouthpiece in your mouth and seal your lips tightly around it. 7. Breathe in slowly and as deeply as possible, raising the piston or the ball toward the top of the column. 8. Hold your breath for 3-5 seconds or for as long as possible. Allow the piston or ball to fall to the bottom of the column. 9. Remove the mouthpiece from your mouth and breathe out normally. 10. Rest for a few seconds and repeat Steps 1 through 7 at least 10 times every 1-2 hours when you are awake. Take your time and take a few normal breaths between deep breaths. 11. The spirometer may include an indicator to show your best effort. Use the indicator as a goal to work toward during each repetition. 12. After each set of 10 deep breaths, practice coughing to be sure your lungs are clear. If you have an incision (the cut made at the time of surgery), support your incision when coughing by placing a pillow or rolled up towels firmly against it. Once you are able to get out of bed, walk around indoors and cough well. You may stop using the incentive spirometer when instructed by your caregiver.  RISKS AND COMPLICATIONS  Take your time so you do not get dizzy or  light-headed.  If you are in pain, you may need to take or ask for pain medication before doing incentive spirometry. It is harder to take a deep breath if you are having pain. AFTER USE  Rest and breathe slowly and easily.  It can be helpful to keep track of a log of your progress. Your caregiver can provide you with a simple table to help with this. If you are using  the spirometer at home, follow these instructions: [place] IF:   You are having difficultly using the spirometer.  You have trouble using the spirometer as often as instructed.  Your pain medication is not giving enough relief while using the spirometer.  You develop fever of 100.5 F (38.1 C) or higher. SEEK IMMEDIATE MEDICAL CARE IF:   You cough up bloody sputum that had not been present before.  You develop fever of 102 F (38.9 C) or greater.  You develop worsening pain at or near the incision site. MAKE SURE YOU:   Understand these instructions.  Will watch your condition.  Will get help right away if you are not doing well or get worse. Document Released: 09/06/2006 Document Revised: 07/19/2011 Document Reviewed: 11/07/2006 [facility] Patient Information 2014 [place], [place].   ________________________________________________________________________

## 2014-09-30 NOTE — Progress Notes (Signed)
Clearance dr spruill chart ekg 2/16 epic Stress 4/14 epic Chest 10/15 epic

## 2014-10-02 ENCOUNTER — Encounter (HOSPITAL_COMMUNITY)
Admission: RE | Admit: 2014-10-02 | Discharge: 2014-10-02 | Disposition: A | Discharge status: Home / Self Care | Payer: Medicare HMO | Source: Ambulatory Visit | Attending: Orthopedic Surgery | Admitting: Orthopedic Surgery

## 2014-10-02 ENCOUNTER — Encounter (HOSPITAL_COMMUNITY): Payer: Self-pay

## 2014-10-02 DIAGNOSIS — M170 Bilateral primary osteoarthritis of knee: Secondary | ICD-10-CM | POA: Insufficient documentation

## 2014-10-02 DIAGNOSIS — Z01812 Encounter for preprocedural laboratory examination: Secondary | ICD-10-CM | POA: Diagnosis present

## 2014-10-02 LAB — COMPREHENSIVE METABOLIC PANEL
ALK PHOS: 120 U/L (ref 38–126)
ALT: 14 U/L (ref 14–54)
ANION GAP: 11 (ref 5–15)
AST: 18 U/L (ref 15–41)
Albumin: 4 g/dL (ref 3.5–5.0)
BILIRUBIN TOTAL: 1 mg/dL (ref 0.3–1.2)
BUN: 23 mg/dL — ABNORMAL HIGH (ref 6–20)
CALCIUM: 9.5 mg/dL (ref 8.9–10.3)
CO2: 29 mmol/L (ref 22–32)
Chloride: 102 mmol/L (ref 101–111)
Creatinine, Ser: 0.7 mg/dL (ref 0.44–1.00)
Glucose, Bld: 100 mg/dL — ABNORMAL HIGH (ref 65–99)
POTASSIUM: 3.6 mmol/L (ref 3.5–5.1)
Sodium: 142 mmol/L (ref 135–145)
Total Protein: 7.8 g/dL (ref 6.5–8.1)

## 2014-10-02 LAB — CBC
HCT: 42.2 % (ref 36.0–46.0)
Hemoglobin: 13.6 g/dL (ref 12.0–15.0)
MCH: 28.9 pg (ref 26.0–34.0)
MCHC: 32.2 g/dL (ref 30.0–36.0)
MCV: 89.8 fL (ref 78.0–100.0)
Platelets: 260 10*3/uL (ref 150–400)
RBC: 4.7 MIL/uL (ref 3.87–5.11)
RDW: 13.8 % (ref 11.5–15.5)
WBC: 7.5 10*3/uL (ref 4.0–10.5)

## 2014-10-02 LAB — URINALYSIS, ROUTINE W REFLEX MICROSCOPIC
Bilirubin Urine: NEGATIVE
GLUCOSE, UA: NEGATIVE mg/dL
Hgb urine dipstick: NEGATIVE
Ketones, ur: NEGATIVE mg/dL
NITRITE: NEGATIVE
Protein, ur: NEGATIVE mg/dL
Specific Gravity, Urine: 1.025 (ref 1.005–1.030)
Urobilinogen, UA: 0.2 mg/dL (ref 0.0–1.0)
pH: 5.5 (ref 5.0–8.0)

## 2014-10-02 LAB — URINE MICROSCOPIC-ADD ON

## 2014-10-02 LAB — SURGICAL PCR SCREEN
MRSA, PCR: NEGATIVE
Staphylococcus aureus: NEGATIVE

## 2014-10-02 LAB — PROTIME-INR
INR: 1.05 (ref 0.00–1.49)
Prothrombin Time: 14 seconds (ref 11.6–15.2)

## 2014-10-02 LAB — APTT: aPTT: 33 seconds (ref 24–37)

## 2014-10-02 LAB — ABO/RH: ABO/RH(D): O POS

## 2014-10-02 NOTE — Progress Notes (Signed)
Faxed urine with micro to dr aluisio via epic 

## 2014-10-02 NOTE — H&P (Signed)
TOTAL KNEE ADMISSION H&P  Patient is being admitted for bilaterel total knee arthroplasty.  Subjective:  Chief Complaint:right and left knee pain.  HPI: Tara Moreno, 69 y.o. female, has a history of pain and functional disability in the right and left knee due to arthritis and has failed non-surgical conservative treatments for greater than 12 weeks to includeNSAID's and/or analgesics, corticosteriod injections, viscosupplementation injections and activity modification.  Onset of symptoms was gradual, starting >10 years ago with gradually worsening course since that time. The patient noted prior procedures on the knee to include  arthroscopy and menisectomy on the right knee(s).  Patient currently rates pain in the right and left knee(s) at 7 out of 10 with activity. Patient has night pain, worsening of pain with activity and weight bearing, pain that interferes with activities of daily living, pain with passive range of motion, crepitus and joint swelling.  Patient has evidence of periarticular osteophytes, joint subluxation and joint space narrowing by imaging studies.  There is no active infection.  Patient Active Problem List   Diagnosis Date Noted  . S/P shoulder replacement 07/05/2014  . Arthritis of shoulder region, right, degenerative 03/08/2014  . Chest discomfort 08/10/2012   Past Medical History  Diagnosis Date  . Hypertension   . Arthritis   . PONV (postoperative nausea and vomiting)   . Frequent urination at night   . GERD (gastroesophageal reflux disease)   . Hyperlipemia     Past Surgical History  Procedure Laterality Date  . Abdominal hysterectomy    . Removal of the renal tumor    . Tonsillectomy    . Partial hysterectomy    . Banectomy    . Colonoscopy    . Breast enhancement surgery  2000  . Cardiac catheterization      no PCI; had before renal tumor was removed  . Revision total shoulder arthroplasty Right 03/08/2014    dr Veverly Fells  . Reverse shoulder  arthroplasty Right 03/08/2014    Procedure: RIGHT TOTAL SHOULDER ARTHROPLASTY VERSES A REVERSE ARTHROPLASTY;  Surgeon: Augustin Schooling, MD;  Location: [facility];  Service: Orthopedics;  Laterality: Right;  . Reverse shoulder arthroplasty Left 07/05/2014    Procedure: LEFT REVERSE SHOULDER ARTHROPLASTY;  Surgeon: Augustin Schooling, MD;  Location: [facility];  Service: Orthopedics;  Laterality: Left;      Current outpatient prescriptions:  .  amLODipine-olmesartan (AZOR) 10-40 MG per tablet, Take 1 tablet by mouth every morning. , Disp: , Rfl:  .  atorvastatin (LIPITOR) 10 MG tablet, Take 10 mg by mouth every morning. , Disp: , Rfl:  .  CycloSPORINE (RESTASIS OP), Place 1 drop into both eyes daily as needed (dry eyes). , Disp: , Rfl:  .  Green Tea, Camillia sinensis, (GREEN TEA EXTRACT PO), Take 1 capsule by mouth every morning. , Disp: , Rfl:  .  methocarbamol (ROBAXIN) 500 MG tablet, Take 1 tablet (500 mg total) by mouth 3 (three) times daily as needed. (Patient taking differently: Take 500 mg by mouth 3 (three) times daily as needed for muscle spasms. ), Disp: 60 tablet, Rfl: 1 .  MILK THISTLE PO, Take 1 capsule by mouth every morning. , Disp: , Rfl:  .  Multiple Vitamin (MULTIVITAMIN WITH MINERALS) TABS tablet, Take 1 tablet by mouth daily., Disp: , Rfl:  .  naproxen sodium (ANAPROX) 220 MG tablet, Take 220-400 mg by mouth 2 (two) times daily as needed (pain.)., Disp: , Rfl:  .  niacin 500 MG tablet, Take 500  mg by mouth every morning. , Disp: , Rfl:  .  Omega-3 Fatty Acids (FISH OIL) 1000 MG CAPS, Take 3,000 mg by mouth every morning. , Disp: , Rfl:  .  oxyCODONE-acetaminophen (ROXICET) 5-325 MG per tablet, Take 1-2 tablets by mouth every 4 (four) hours as needed for severe pain., Disp: 60 tablet, Rfl: 0 .  pantoprazole (PROTONIX) 40 MG tablet, Take 40 mg by mouth every morning. , Disp: , Rfl:  .  Probiotic Product (PROBIOTIC DAILY PO), Take 1 tablet by mouth every morning. , Disp: , Rfl:  .  UBIQUINOL  PO, Take 100 mg by mouth every morning. , Disp: , Rfl:    No Known Allergies  History  Substance Use Topics  . Smoking status: Never Smoker   . Smokeless tobacco: Never Used  . Alcohol Use: No    Family History  Problem Relation Age of Onset  . Hypertension Mother   . Hypertension Father   . Hypertension Sister   . Hypertension Brother   . Hypertension Other      Review of Systems  Constitutional: Positive for diaphoresis. Negative for fever, chills, weight loss and malaise/fatigue.  HENT: Negative.   Eyes: Negative.   Respiratory: Negative.   Cardiovascular: Negative.   Gastrointestinal: Negative.   Genitourinary: Negative.   Musculoskeletal: Positive for joint pain. Negative for myalgias, back pain, falls and neck pain.       Bilateral knee pain  Skin: Negative.   Neurological: Negative.  Negative for weakness.  Endo/Heme/Allergies: Negative.   Psychiatric/Behavioral: Negative.     Objective:  Physical Exam  Constitutional: She is oriented to person, place, and time. She appears well-developed. No distress.  HENT:  Head: Normocephalic and atraumatic.  Right Ear: External ear normal.  Left Ear: External ear normal.  Nose: Nose normal.  Mouth/Throat: Oropharynx is clear and moist.  Eyes: Conjunctivae are normal.  Neck: Normal range of motion. Neck supple.  Cardiovascular: Normal rate, regular rhythm, normal heart sounds and intact distal pulses.   No murmur heard. Respiratory: Effort normal and breath sounds normal. No respiratory distress. She has no wheezes.  GI: Soft. Bowel sounds are normal. She exhibits no distension. There is no tenderness.  Musculoskeletal:       Right hip: Normal.       Left hip: Normal.       Right knee: She exhibits decreased range of motion and swelling. She exhibits no effusion and no erythema. Tenderness found. Medial joint line and lateral joint line tenderness noted.       Left knee: She exhibits decreased range of motion and  swelling. She exhibits no effusion and no erythema. Tenderness found. Medial joint line and lateral joint line tenderness noted.  Evaluation of her hips show normal range of motion, no discomfort. Both knees show no effusion. There is marked crepitus on range of motion of both knees, range about 5-120 on each side with no instability noted.  Neurological: She is alert and oriented to person, place, and time. She has normal strength and normal reflexes. No sensory deficit.  Skin: No rash noted. She is not diaphoretic. No erythema.  Psychiatric: She has a normal mood and affect. Her behavior is normal.    Vitals  Weight: 245 lb Height: 67.5in Body Surface Area: 2.22 m Body Mass Index: 37.81 kg/m  Pulse: 80 (Regular)  BP: 126/76 (Sitting, Left Arm, Standard)  Imaging Review Plain radiographs demonstrate severe degenerative joint disease of the right and left knee(s). The overall  alignment issignificant varus. The bone quality appears to be good for age and reported activity level.  Assessment/Plan:  End stage primary osteoarthritis, right and left knees   The patient history, physical examination, clinical judgment of the provider and imaging studies are consistent with end stage degenerative joint disease of the right and left knee(s) and total knee arthroplasty is deemed medically necessary. The treatment options including medical management, injection therapy arthroscopy and arthroplasty were discussed at length. The risks and benefits of total knee arthroplasty were presented and reviewed. The risks due to aseptic loosening, infection, stiffness, patella tracking problems, thromboembolic complications and other imponderables were discussed. The patient acknowledged the explanation, agreed to proceed with the plan and consent was signed. Patient is being admitted for inpatient treatment for surgery, pain control, PT, OT, prophylactic antibiotics, VTE prophylaxis, progressive ambulation  and ADL's and discharge planning. The patient is planning to be discharged to inpatient rehab (CIR)   PCP: Dr. Reynaldo Minium  TXA IV    Ardeen Jourdain, PA-C

## 2014-10-09 ENCOUNTER — Inpatient Hospital Stay (HOSPITAL_COMMUNITY): Payer: Medicare HMO | Admitting: Anesthesiology

## 2014-10-09 ENCOUNTER — Encounter (HOSPITAL_COMMUNITY)
Admission: RE | Disposition: A | Discharge status: Skilled Nursing Facility | Payer: Self-pay | Source: Ambulatory Visit | Attending: Orthopedic Surgery

## 2014-10-09 ENCOUNTER — Inpatient Hospital Stay (HOSPITAL_COMMUNITY)
Admission: RE | Admit: 2014-10-09 | Discharge: 2014-10-15 | DRG: 462 | Disposition: A | Discharge status: Skilled Nursing Facility | Payer: Medicare HMO | Source: Ambulatory Visit | Attending: Orthopedic Surgery | Admitting: Orthopedic Surgery

## 2014-10-09 ENCOUNTER — Encounter (HOSPITAL_COMMUNITY): Payer: Self-pay | Admitting: *Deleted

## 2014-10-09 DIAGNOSIS — Z96611 Presence of right artificial shoulder joint: Secondary | ICD-10-CM | POA: Diagnosis present

## 2014-10-09 DIAGNOSIS — K219 Gastro-esophageal reflux disease without esophagitis: Secondary | ICD-10-CM | POA: Diagnosis present

## 2014-10-09 DIAGNOSIS — M1710 Unilateral primary osteoarthritis, unspecified knee: Secondary | ICD-10-CM | POA: Diagnosis present

## 2014-10-09 DIAGNOSIS — D62 Acute posthemorrhagic anemia: Secondary | ICD-10-CM | POA: Diagnosis not present

## 2014-10-09 DIAGNOSIS — M25569 Pain in unspecified knee: Secondary | ICD-10-CM | POA: Diagnosis present

## 2014-10-09 DIAGNOSIS — Z96612 Presence of left artificial shoulder joint: Secondary | ICD-10-CM | POA: Diagnosis present

## 2014-10-09 DIAGNOSIS — Z8249 Family history of ischemic heart disease and other diseases of the circulatory system: Secondary | ICD-10-CM

## 2014-10-09 DIAGNOSIS — Z79899 Other long term (current) drug therapy: Secondary | ICD-10-CM

## 2014-10-09 DIAGNOSIS — E669 Obesity, unspecified: Secondary | ICD-10-CM | POA: Diagnosis present

## 2014-10-09 DIAGNOSIS — Z96653 Presence of artificial knee joint, bilateral: Secondary | ICD-10-CM | POA: Diagnosis not present

## 2014-10-09 DIAGNOSIS — M170 Bilateral primary osteoarthritis of knee: Principal | ICD-10-CM | POA: Diagnosis present

## 2014-10-09 DIAGNOSIS — Z01812 Encounter for preprocedural laboratory examination: Secondary | ICD-10-CM

## 2014-10-09 DIAGNOSIS — E785 Hyperlipidemia, unspecified: Secondary | ICD-10-CM | POA: Diagnosis present

## 2014-10-09 DIAGNOSIS — I10 Essential (primary) hypertension: Secondary | ICD-10-CM | POA: Diagnosis present

## 2014-10-09 DIAGNOSIS — Z6838 Body mass index (BMI) 38.0-38.9, adult: Secondary | ICD-10-CM | POA: Diagnosis not present

## 2014-10-09 DIAGNOSIS — M179 Osteoarthritis of knee, unspecified: Secondary | ICD-10-CM | POA: Diagnosis present

## 2014-10-09 HISTORY — PX: TOTAL KNEE ARTHROPLASTY: SHX125

## 2014-10-09 LAB — TYPE AND SCREEN
ABO/RH(D): O POS
ANTIBODY SCREEN: NEGATIVE

## 2014-10-09 SURGERY — ARTHROPLASTY, KNEE, BILATERAL, TOTAL
Anesthesia: Monitor Anesthesia Care | Site: Knee | Laterality: Bilateral

## 2014-10-09 MED ORDER — ACETAMINOPHEN 650 MG RE SUPP: 650.0000 mg | Freq: Four times a day (QID) | RECTAL | Status: DC | PRN

## 2014-10-09 MED ORDER — AMLODIPINE BESYLATE 10 MG PO TABS
10.0000 mg | ORAL_TABLET | Freq: Once | ORAL | Status: AC
  Administered 2014-10-09: 10 mg via ORAL
  Filled 2014-10-09: qty 1

## 2014-10-09 MED ORDER — BUPIVACAINE-EPINEPHRINE (PF) 0.5% -1:200000 IJ SOLN
INTRAMUSCULAR | Status: DC | PRN
  Administered 2014-10-09 (×2): 2.5 mL

## 2014-10-09 MED ORDER — MENTHOL 3 MG MT LOZG: 1.0000 | LOZENGE | OROMUCOSAL | Status: DC | PRN

## 2014-10-09 MED ORDER — FENTANYL CITRATE (PF) 100 MCG/2ML IJ SOLN
INTRAMUSCULAR | Status: DC | PRN
  Administered 2014-10-09 (×2): 50 ug via EPIDURAL

## 2014-10-09 MED ORDER — PHENOL 1.4 % MT LIQD
1.0000 | OROMUCOSAL | Status: DC | PRN
  Filled 2014-10-09: qty 177

## 2014-10-09 MED ORDER — MIDAZOLAM HCL 2 MG/2ML IJ SOLN
INTRAMUSCULAR | Status: AC
  Filled 2014-10-09: qty 2

## 2014-10-09 MED ORDER — PROPOFOL 10 MG/ML IV BOLUS
INTRAVENOUS | Status: AC
Start: 2014-10-09 — End: 2014-10-09
  Filled 2014-10-09: qty 20

## 2014-10-09 MED ORDER — AMLODIPINE BESYLATE 10 MG PO TABS
10.0000 mg | ORAL_TABLET | Freq: Every day | ORAL | Status: DC
  Administered 2014-10-12 – 2014-10-14 (×3): 10 mg via ORAL
  Filled 2014-10-09 (×7): qty 1

## 2014-10-09 MED ORDER — UBIQUINOL 100 MG PO CAPS: 100.0000 mg | ORAL_CAPSULE | Freq: Every morning | ORAL | Status: DC

## 2014-10-09 MED ORDER — MORPHINE SULFATE 2 MG/ML IJ SOLN
1.0000 mg | INTRAMUSCULAR | Status: DC | PRN
  Administered 2014-10-09 – 2014-10-13 (×9): 2 mg via INTRAVENOUS
  Filled 2014-10-09 (×9): qty 1

## 2014-10-09 MED ORDER — FENTANYL CITRATE (PF) 100 MCG/2ML IJ SOLN
INTRAMUSCULAR | Status: AC
  Filled 2014-10-09: qty 2

## 2014-10-09 MED ORDER — METOCLOPRAMIDE HCL 5 MG/ML IJ SOLN
INTRAMUSCULAR | Status: DC | PRN
  Administered 2014-10-09: 10 mg via INTRAVENOUS

## 2014-10-09 MED ORDER — 0.9 % SODIUM CHLORIDE (POUR BTL) OPTIME
TOPICAL | Status: DC | PRN
  Administered 2014-10-09: 1000 mL

## 2014-10-09 MED ORDER — SODIUM CHLORIDE 0.9 % IV SOLN: INTRAVENOUS | Status: DC

## 2014-10-09 MED ORDER — SODIUM CHLORIDE 0.9 % IR SOLN
Status: DC | PRN
  Administered 2014-10-09: 3000 mL

## 2014-10-09 MED ORDER — SODIUM BICARBONATE 4 % IV SOLN
INTRAVENOUS | Status: AC
  Filled 2014-10-09: qty 5

## 2014-10-09 MED ORDER — OXYCODONE HCL 5 MG PO TABS
5.0000 mg | ORAL_TABLET | ORAL | Status: DC | PRN
  Administered 2014-10-09 (×3): 5 mg via ORAL
  Administered 2014-10-10: 10 mg via ORAL
  Administered 2014-10-10: 15 mg via ORAL
  Administered 2014-10-10: 10 mg via ORAL
  Administered 2014-10-10 (×2): 15 mg via ORAL
  Administered 2014-10-11: 20 mg via ORAL
  Administered 2014-10-11: 15 mg via ORAL
  Administered 2014-10-11: 20 mg via ORAL
  Administered 2014-10-11: 15 mg via ORAL
  Administered 2014-10-11: 20 mg via ORAL
  Administered 2014-10-11: 10 mg via ORAL
  Administered 2014-10-12 – 2014-10-15 (×14): 20 mg via ORAL
  Filled 2014-10-09: qty 4
  Filled 2014-10-09: qty 3
  Filled 2014-10-09 (×3): qty 4
  Filled 2014-10-09: qty 1
  Filled 2014-10-09 (×3): qty 4
  Filled 2014-10-09: qty 1
  Filled 2014-10-09: qty 3
  Filled 2014-10-09: qty 4
  Filled 2014-10-09: qty 3
  Filled 2014-10-09: qty 2
  Filled 2014-10-09 (×2): qty 4
  Filled 2014-10-09: qty 2
  Filled 2014-10-09 (×5): qty 4
  Filled 2014-10-09: qty 3
  Filled 2014-10-09: qty 1
  Filled 2014-10-09 (×3): qty 4
  Filled 2014-10-09: qty 2

## 2014-10-09 MED ORDER — BUPIVACAINE-EPINEPHRINE (PF) 0.5% -1:200000 IJ SOLN
INTRAMUSCULAR | Status: AC
  Filled 2014-10-09: qty 30

## 2014-10-09 MED ORDER — POTASSIUM CHLORIDE IN NACL 20-0.45 MEQ/L-% IV SOLN
INTRAVENOUS | Status: DC
  Administered 2014-10-09 – 2014-10-11 (×4): via INTRAVENOUS
  Filled 2014-10-09 (×5): qty 1000

## 2014-10-09 MED ORDER — SCOPOLAMINE 1 MG/3DAYS TD PT72
1.0000 | MEDICATED_PATCH | TRANSDERMAL | Status: DC
  Administered 2014-10-09: 1.5 mg via TRANSDERMAL
  Filled 2014-10-09: qty 1

## 2014-10-09 MED ORDER — SCOPOLAMINE 1 MG/3DAYS TD PT72
MEDICATED_PATCH | TRANSDERMAL | Status: AC
  Filled 2014-10-09: qty 1

## 2014-10-09 MED ORDER — MIDAZOLAM HCL 2 MG/2ML IJ SOLN
1.0000 mg | INTRAMUSCULAR | Status: DC | PRN
  Administered 2014-10-09: 1 mg via INTRAVENOUS
  Administered 2014-10-09: 2 mg via INTRAVENOUS
  Administered 2014-10-09: 1 mg via INTRAVENOUS
  Filled 2014-10-09 (×3): qty 2

## 2014-10-09 MED ORDER — IRBESARTAN 300 MG PO TABS
300.0000 mg | ORAL_TABLET | Freq: Every day | ORAL | Status: DC
  Administered 2014-10-10 – 2014-10-15 (×6): 300 mg via ORAL
  Filled 2014-10-09 (×7): qty 1

## 2014-10-09 MED ORDER — FENTANYL CITRATE (PF) 100 MCG/2ML IJ SOLN: 50.0000 ug | INTRAMUSCULAR | Status: DC | PRN

## 2014-10-09 MED ORDER — FENTANYL CITRATE (PF) 100 MCG/2ML IJ SOLN
INTRAMUSCULAR | Status: DC | PRN
  Administered 2014-10-09: 100 ug via INTRAVENOUS

## 2014-10-09 MED ORDER — TRAMADOL HCL 50 MG PO TABS
50.0000 mg | ORAL_TABLET | Freq: Four times a day (QID) | ORAL | Status: DC | PRN
Start: 2014-10-09 — End: 2014-10-15
  Administered 2014-10-10 – 2014-10-12 (×3): 100 mg via ORAL
  Filled 2014-10-09 (×3): qty 2

## 2014-10-09 MED ORDER — CEFAZOLIN SODIUM-DEXTROSE 2-3 GM-% IV SOLR
2.0000 g | INTRAVENOUS | Status: AC
Start: 2014-10-09 — End: 2014-10-09
  Administered 2014-10-09: 2 g via INTRAVENOUS

## 2014-10-09 MED ORDER — PROPOFOL INFUSION 10 MG/ML OPTIME
INTRAVENOUS | Status: DC | PRN
  Administered 2014-10-09: 75 ug/kg/min via INTRAVENOUS

## 2014-10-09 MED ORDER — PROPOFOL 10 MG/ML IV BOLUS
INTRAVENOUS | Status: DC | PRN
  Administered 2014-10-09: 200 mg via INTRAVENOUS

## 2014-10-09 MED ORDER — POLYETHYLENE GLYCOL 3350 17 G PO PACK
17.0000 g | PACK | Freq: Every day | ORAL | Status: DC | PRN
  Administered 2014-10-14: 17 g via ORAL
  Filled 2014-10-09: qty 1

## 2014-10-09 MED ORDER — PANTOPRAZOLE SODIUM 40 MG PO TBEC
40.0000 mg | DELAYED_RELEASE_TABLET | Freq: Every morning | ORAL | Status: DC
  Administered 2014-10-10 – 2014-10-15 (×6): 40 mg via ORAL
  Filled 2014-10-09 (×6): qty 1

## 2014-10-09 MED ORDER — LIDOCAINE HCL (CARDIAC) 20 MG/ML IV SOLN
INTRAVENOUS | Status: DC | PRN
  Administered 2014-10-09: 100 mg via INTRAVENOUS

## 2014-10-09 MED ORDER — METHOCARBAMOL 1000 MG/10ML IJ SOLN
500.0000 mg | Freq: Four times a day (QID) | INTRAVENOUS | Status: DC | PRN
  Administered 2014-10-09: 500 mg via INTRAVENOUS
  Filled 2014-10-09 (×2): qty 5

## 2014-10-09 MED ORDER — HYDROMORPHONE HCL 1 MG/ML IJ SOLN
INTRAMUSCULAR | Status: AC
  Filled 2014-10-09: qty 1

## 2014-10-09 MED ORDER — PROPOFOL 10 MG/ML IV BOLUS
INTRAVENOUS | Status: AC
  Filled 2014-10-09: qty 20

## 2014-10-09 MED ORDER — ONDANSETRON HCL 4 MG PO TABS: 4.0000 mg | ORAL_TABLET | Freq: Four times a day (QID) | ORAL | Status: DC | PRN

## 2014-10-09 MED ORDER — LIDOCAINE-EPINEPHRINE (PF) 2 %-1:200000 IJ SOLN
INTRAMUSCULAR | Status: DC | PRN
  Administered 2014-10-09: 10 mL
  Administered 2014-10-09 (×2): 5 mL via INTRADERMAL

## 2014-10-09 MED ORDER — CEFAZOLIN SODIUM-DEXTROSE 2-3 GM-% IV SOLR
2.0000 g | Freq: Four times a day (QID) | INTRAVENOUS | Status: AC
  Administered 2014-10-09 (×2): 2 g via INTRAVENOUS
  Filled 2014-10-09 (×2): qty 50

## 2014-10-09 MED ORDER — ACETAMINOPHEN 325 MG PO TABS: 650.0000 mg | ORAL_TABLET | Freq: Four times a day (QID) | ORAL | Status: DC | PRN

## 2014-10-09 MED ORDER — ALBUMIN HUMAN 5 % IV SOLN
INTRAVENOUS | Status: DC | PRN
  Administered 2014-10-09: 11:00:00 via INTRAVENOUS

## 2014-10-09 MED ORDER — CYCLOSPORINE 0.05 % OP EMUL
1.0000 [drp] | Freq: Every day | OPHTHALMIC | Status: DC | PRN
  Filled 2014-10-09: qty 1

## 2014-10-09 MED ORDER — CEFAZOLIN SODIUM-DEXTROSE 2-3 GM-% IV SOLR
INTRAVENOUS | Status: AC
  Filled 2014-10-09: qty 50

## 2014-10-09 MED ORDER — ONDANSETRON HCL 4 MG/2ML IJ SOLN: 4.0000 mg | Freq: Four times a day (QID) | INTRAMUSCULAR | Status: DC | PRN

## 2014-10-09 MED ORDER — WARFARIN SODIUM 2.5 MG PO TABS
2.5000 mg | ORAL_TABLET | Freq: Once | ORAL | Status: AC
  Administered 2014-10-10: 2.5 mg via ORAL
  Filled 2014-10-09: qty 1

## 2014-10-09 MED ORDER — METOCLOPRAMIDE HCL 5 MG/ML IJ SOLN
INTRAMUSCULAR | Status: AC
  Filled 2014-10-09: qty 2

## 2014-10-09 MED ORDER — PHENYLEPHRINE HCL 10 MG/ML IJ SOLN
INTRAMUSCULAR | Status: DC | PRN
Start: 2014-10-09 — End: 2014-10-09
  Administered 2014-10-09 (×2): 80 ug via INTRAVENOUS

## 2014-10-09 MED ORDER — ACETAMINOPHEN 10 MG/ML IV SOLN
1000.0000 mg | Freq: Once | INTRAVENOUS | Status: AC
  Administered 2014-10-09: 1000 mg via INTRAVENOUS
  Filled 2014-10-09: qty 100

## 2014-10-09 MED ORDER — LIDOCAINE-EPINEPHRINE (PF) 2 %-1:200000 IJ SOLN
INTRAMUSCULAR | Status: AC
  Filled 2014-10-09: qty 20

## 2014-10-09 MED ORDER — DEXAMETHASONE SODIUM PHOSPHATE 10 MG/ML IJ SOLN
10.0000 mg | Freq: Once | INTRAMUSCULAR | Status: AC
  Administered 2014-10-10: 10 mg via INTRAVENOUS
  Filled 2014-10-09: qty 1

## 2014-10-09 MED ORDER — TRANEXAMIC ACID 1000 MG/10ML IV SOLN
1000.0000 mg | INTRAVENOUS | Status: AC
  Administered 2014-10-09: 1000 mg via INTRAVENOUS
  Filled 2014-10-09: qty 10

## 2014-10-09 MED ORDER — EPHEDRINE SULFATE 50 MG/ML IJ SOLN
INTRAMUSCULAR | Status: DC | PRN
  Administered 2014-10-09: 5 mg via INTRAVENOUS
  Administered 2014-10-09: 10 mg via INTRAVENOUS

## 2014-10-09 MED ORDER — ALBUMIN HUMAN 5 % IV SOLN
INTRAVENOUS | Status: AC
  Filled 2014-10-09: qty 500

## 2014-10-09 MED ORDER — ONDANSETRON HCL 4 MG/2ML IJ SOLN
INTRAMUSCULAR | Status: DC | PRN
  Administered 2014-10-09: 4 mg via INTRAVENOUS

## 2014-10-09 MED ORDER — ONDANSETRON HCL 4 MG/2ML IJ SOLN
INTRAMUSCULAR | Status: AC
  Filled 2014-10-09: qty 2

## 2014-10-09 MED ORDER — HYDROMORPHONE HCL 1 MG/ML IJ SOLN
0.2500 mg | INTRAMUSCULAR | Status: DC | PRN
  Administered 2014-10-09: 0.5 mg via INTRAVENOUS
  Administered 2014-10-09 (×2): 0.25 mg via INTRAVENOUS

## 2014-10-09 MED ORDER — WARFARIN - PHARMACIST DOSING INPATIENT: Freq: Every day | Status: DC

## 2014-10-09 MED ORDER — STERILE WATER FOR IRRIGATION IR SOLN
Status: DC | PRN
  Administered 2014-10-09: 1500 mL

## 2014-10-09 MED ORDER — PHENYLEPHRINE HCL 10 MG/ML IJ SOLN
INTRAMUSCULAR | Status: AC
  Filled 2014-10-09: qty 1

## 2014-10-09 MED ORDER — BISACODYL 10 MG RE SUPP: 10.0000 mg | Freq: Every day | RECTAL | Status: DC | PRN

## 2014-10-09 MED ORDER — LACTATED RINGERS IV SOLN
INTRAVENOUS | Status: DC | PRN
  Administered 2014-10-09 (×2): via INTRAVENOUS

## 2014-10-09 MED ORDER — DOCUSATE SODIUM 100 MG PO CAPS
100.0000 mg | ORAL_CAPSULE | Freq: Two times a day (BID) | ORAL | Status: DC
  Administered 2014-10-09 – 2014-10-15 (×13): 100 mg via ORAL

## 2014-10-09 MED ORDER — METHOCARBAMOL 500 MG PO TABS
500.0000 mg | ORAL_TABLET | Freq: Four times a day (QID) | ORAL | Status: DC | PRN
  Administered 2014-10-09 – 2014-10-14 (×13): 500 mg via ORAL
  Filled 2014-10-09 (×13): qty 1

## 2014-10-09 MED ORDER — FLEET ENEMA 7-19 GM/118ML RE ENEM: 1.0000 | ENEMA | Freq: Once | RECTAL | Status: AC | PRN

## 2014-10-09 MED ORDER — ROPIVACAINE HCL 2 MG/ML IJ SOLN
10.0000 mL/h | INTRAMUSCULAR | Status: DC
  Administered 2014-10-09: 10 mL/h via EPIDURAL
  Filled 2014-10-09 (×3): qty 200

## 2014-10-09 MED ORDER — LIDOCAINE HCL (CARDIAC) 20 MG/ML IV SOLN
INTRAVENOUS | Status: AC
Start: 2014-10-09 — End: 2014-10-09
  Filled 2014-10-09: qty 5

## 2014-10-09 MED ORDER — METOCLOPRAMIDE HCL 10 MG PO TABS: 5.0000 mg | ORAL_TABLET | Freq: Three times a day (TID) | ORAL | Status: DC | PRN

## 2014-10-09 MED ORDER — ACETAMINOPHEN 500 MG PO TABS
1000.0000 mg | ORAL_TABLET | Freq: Four times a day (QID) | ORAL | Status: AC
  Administered 2014-10-09 – 2014-10-10 (×4): 1000 mg via ORAL
  Filled 2014-10-09 (×5): qty 2

## 2014-10-09 MED ORDER — AMLODIPINE-OLMESARTAN 10-40 MG PO TABS: 1.0000 | ORAL_TABLET | Freq: Every morning | ORAL | Status: DC

## 2014-10-09 MED ORDER — PROMETHAZINE HCL 25 MG/ML IJ SOLN
6.2500 mg | INTRAMUSCULAR | Status: DC | PRN
Start: 2014-10-09 — End: 2014-10-09

## 2014-10-09 MED ORDER — DEXAMETHASONE SODIUM PHOSPHATE 10 MG/ML IJ SOLN
INTRAMUSCULAR | Status: AC
  Filled 2014-10-09: qty 1

## 2014-10-09 MED ORDER — DEXAMETHASONE SODIUM PHOSPHATE 10 MG/ML IJ SOLN
10.0000 mg | Freq: Once | INTRAMUSCULAR | Status: AC
  Administered 2014-10-09: 10 mg via INTRAVENOUS

## 2014-10-09 MED ORDER — ATORVASTATIN CALCIUM 10 MG PO TABS
10.0000 mg | ORAL_TABLET | Freq: Every morning | ORAL | Status: DC
  Administered 2014-10-09 – 2014-10-15 (×7): 10 mg via ORAL
  Filled 2014-10-09 (×7): qty 1

## 2014-10-09 MED ORDER — METOCLOPRAMIDE HCL 5 MG/ML IJ SOLN
5.0000 mg | Freq: Three times a day (TID) | INTRAMUSCULAR | Status: DC | PRN
Start: 2014-10-09 — End: 2014-10-15

## 2014-10-09 MED ORDER — DIPHENHYDRAMINE HCL 12.5 MG/5ML PO ELIX: 12.5000 mg | ORAL_SOLUTION | ORAL | Status: DC | PRN

## 2014-10-09 SURGICAL SUPPLY — 61 items
BAG SPEC THK2 15X12 ZIP CLS (MISCELLANEOUS) ×1
BAG ZIPLOCK 12X15 (MISCELLANEOUS) ×3 IMPLANT
BANDAGE ELASTIC 6 VELCRO ST LF (GAUZE/BANDAGES/DRESSINGS) ×3 IMPLANT
BANDAGE ESMARK 6X9 LF (GAUZE/BANDAGES/DRESSINGS) ×2 IMPLANT
BLADE SAG 18X100X1.27 (BLADE) ×3 IMPLANT
BLADE SAW SGTL 11.0X1.19X90.0M (BLADE) ×3 IMPLANT
BLADE SURG SZ10 CARB STEEL (BLADE) ×6 IMPLANT
BNDG CMPR 9X6 STRL LF SNTH (GAUZE/BANDAGES/DRESSINGS) ×2
BNDG COHESIVE 6X5 TAN STRL LF (GAUZE/BANDAGES/DRESSINGS) ×2 IMPLANT
BNDG ESMARK 6X9 LF (GAUZE/BANDAGES/DRESSINGS) ×4
BOWL SMART MIX CTS (DISPOSABLE) ×4 IMPLANT
CAPT KNEE TOTAL 3 ATTUNE ×2 IMPLANT
CEMENT HV SMART SET (Cement) ×8 IMPLANT
CUFF TOURN SGL QUICK 34 (TOURNIQUET CUFF) ×4
CUFF TOURN SGL QUICK 44 (TOURNIQUET CUFF) ×2 IMPLANT
CUFF TRNQT CYL 34X4X40X1 (TOURNIQUET CUFF) ×2 IMPLANT
DRAPE EXTREMITY BILATERAL (DRAPE) ×2 IMPLANT
DRAPE INCISE IOBAN 66X45 STRL (DRAPES) ×2 IMPLANT
DRAPE POUCH INSTRU U-SHP 10X18 (DRAPES) ×2 IMPLANT
DRAPE SHEET LG 3/4 BI-LAMINATE (DRAPES) ×1 IMPLANT
DRAPE U-SHAPE 47X51 STRL (DRAPES) ×6 IMPLANT
DRSG ADAPTIC 3X8 NADH LF (GAUZE/BANDAGES/DRESSINGS) ×3 IMPLANT
DRSG PAD ABDOMINAL 8X10 ST (GAUZE/BANDAGES/DRESSINGS) ×3 IMPLANT
DURAPREP 26ML APPLICATOR (WOUND CARE) ×4 IMPLANT
ELECT REM PT RETURN 9FT ADLT (ELECTROSURGICAL) ×2
ELECTRODE REM PT RTRN 9FT ADLT (ELECTROSURGICAL) ×1 IMPLANT
EVACUATOR 1/8 PVC DRAIN (DRAIN) ×4 IMPLANT
FACESHIELD WRAPAROUND (MASK) ×16 IMPLANT
FACESHIELD WRAPAROUND OR TEAM (MASK) ×8 IMPLANT
GAUZE SPONGE 4X4 12PLY STRL (GAUZE/BANDAGES/DRESSINGS) ×3 IMPLANT
GLOVE BIO SURGEON STRL SZ7.5 (GLOVE) ×4 IMPLANT
GLOVE BIO SURGEON STRL SZ8 (GLOVE) ×4 IMPLANT
GLOVE BIOGEL PI IND STRL 8 (GLOVE) ×2 IMPLANT
GLOVE BIOGEL PI INDICATOR 8 (GLOVE) ×2
GOWN STRL REUS W/TWL LRG LVL3 (GOWN DISPOSABLE) ×2 IMPLANT
GOWN STRL REUS W/TWL XL LVL3 (GOWN DISPOSABLE) ×2 IMPLANT
HANDPIECE INTERPULSE COAX TIP (DISPOSABLE) ×2
IMMOBILIZER KNEE 20 (SOFTGOODS) ×2
IMMOBILIZER KNEE 20 THIGH 36 (SOFTGOODS) ×2 IMPLANT
KIT BASIN OR (CUSTOM PROCEDURE TRAY) ×2 IMPLANT
MANIFOLD NEPTUNE II (INSTRUMENTS) ×2 IMPLANT
NDL SAFETY ECLIPSE 18X1.5 (NEEDLE) ×2 IMPLANT
NEEDLE HYPO 18GX1.5 SHARP (NEEDLE)
NS IRRIG 1000ML POUR BTL (IV SOLUTION) ×2 IMPLANT
PACK TOTAL JOINT (CUSTOM PROCEDURE TRAY) ×2 IMPLANT
PADDING CAST COTTON 6X4 STRL (CAST SUPPLIES) ×7 IMPLANT
SET HNDPC FAN SPRY TIP SCT (DISPOSABLE) ×1 IMPLANT
SPONGE LAP 18X18 X RAY DECT (DISPOSABLE) ×2 IMPLANT
STOCKINETTE 8 INCH (MISCELLANEOUS) ×2 IMPLANT
STRIP CLOSURE SKIN 1/2X4 (GAUZE/BANDAGES/DRESSINGS) ×5 IMPLANT
SUCTION FRAZIER 12FR DISP (SUCTIONS) ×2 IMPLANT
SUT MNCRL AB 4-0 PS2 18 (SUTURE) ×4 IMPLANT
SUT VIC AB 2-0 CT1 27 (SUTURE) ×14
SUT VIC AB 2-0 CT1 TAPERPNT 27 (SUTURE) ×6 IMPLANT
SUT VLOC 180 0 24IN GS25 (SUTURE) ×4 IMPLANT
SYR 20CC LL (SYRINGE) ×2 IMPLANT
SYR 50ML LL SCALE MARK (SYRINGE) ×2 IMPLANT
TOWEL OR 17X26 10 PK STRL BLUE (TOWEL DISPOSABLE) ×4 IMPLANT
TRAY FOLEY W/METER SILVER 14FR (SET/KITS/TRAYS/PACK) ×2 IMPLANT
WATER STERILE IRR 1500ML POUR (IV SOLUTION) ×4 IMPLANT
WRAP KNEE MAXI GEL POST OP (GAUZE/BANDAGES/DRESSINGS) ×3 IMPLANT

## 2014-10-09 NOTE — Transfer of Care (Signed)
Immediate Anesthesia Transfer of Care Note  Patient: Tara Moreno  Procedure(s) Performed: Procedure(s) with comments: BILATERAL TOTAL KNEE ARTHROPLASTY (Bilateral) - with general anesthesia  Patient Location: PACU  Anesthesia Type:General and Epidural  Level of Consciousness: Patient easily awoken, sedated, comfortable, cooperative, following commands, responds to stimulation.   Airway & Oxygen Therapy: Patient spontaneously breathing, ventilating well, oxygen via simple oxygen mask.  Post-op Assessment: Report given to PACU RN, vital signs reviewed and stable, moving all extremities.   Post vital signs: Reviewed and stable.  Complications: No apparent anesthesia complications

## 2014-10-09 NOTE — Anesthesia Postprocedure Evaluation (Signed)
Anesthesia Post Note  Patient: Tara Moreno  Procedure(s) Performed: Procedure(s) (LRB): BILATERAL TOTAL KNEE ARTHROPLASTY (Bilateral)  Anesthesia type: general  Patient location: PACU  Post pain: Pain level controlled  Post assessment: Patient's Cardiovascular Status Stable  Last Vitals:  Filed Vitals:   10/09/14 1430  BP: 106/74  Pulse: 85  Temp: 36.5 C  Resp: 12    Post vital signs: Reviewed and stable  Level of consciousness: sedated  Complications: No apparent anesthesia complications

## 2014-10-09 NOTE — Anesthesia Procedure Notes (Addendum)
Epidural Patient location during procedure: holding area Start time: 10/09/2014 10:17 AM End time: 10/09/2014 10:28 AM  Staffing Anesthesiologist: Duane Boston Performed by: anesthesiologist   Preanesthetic Checklist Completed: patient identified, site marked, pre-op evaluation, timeout performed, IV checked, risks and benefits discussed and monitors and equipment checked  Epidural Patient position: sitting Prep: ChloraPrep Patient monitoring: heart rate, cardiac monitor, continuous pulse ox and blood pressure Approach: midline Location: L2-L3 Injection technique: LOR saline  Needle:  Needle type: Tuohy  Needle gauge: 17 G Needle length: 9 cm Needle insertion depth: 8 cm Catheter size: 20 Guage Catheter at skin depth: 14 cm Test dose: negative, Other and 1.5% lidocaine with Epi 1:200 K  Assessment Events: blood not aspirated, injection not painful, no injection resistance and negative IV test  Additional Notes Informed consent obtained prior to proceeding including risk of failure, 1% risk of PDPH, risk of minor discomfort and bruising.  Discussed rare but serious complications including epidural abscess, permanent nerve injury, epidural hematoma.  Discussed alternatives to epidural analgesia and patient desires to proceed.  Timeout performed pre-procedure verifying patient name, procedure, and platelet count.  Patient tolerated procedure well. Reason for block:surgical anesthesia  Procedure Name: LMA Insertion Date/Time: 10/09/2014 11:17 AM Performed by: Deliah Boston Pre-anesthesia Checklist: Patient identified, Emergency Drugs available, Suction available and Patient being monitored Patient Re-evaluated:Patient Re-evaluated prior to inductionOxygen Delivery Method: Circle system utilized Preoxygenation: Pre-oxygenation with 100% oxygen Intubation Type: IV induction Ventilation: Mask ventilation without difficulty LMA: LMA with gastric port inserted LMA Size: 4.0 Number  of attempts: 1 Placement Confirmation: positive ETCO2 and breath sounds checked- equal and bilateral Tube secured with: Tape Dental Injury: Teeth and Oropharynx as per pre-operative assessment

## 2014-10-09 NOTE — Op Note (Signed)
Pre-operative diagnosis- Osteoarthritis  Bilateral knee(s)  Post-operative diagnosis- Osteoarthritis Bilateral knee(s)  Procedure-  Bilateral  Total Knee Arthroplasty  Surgeon- Tara Plover. Katharyn Schauer, MD  Assistant- [name], PA-C   Anesthesia-  General and Epidural  EBL-* No blood loss amount entered *   Drains Hemovac each side  Tourniquet time- Left 38 minutes @ 300 mm Hg   Right 35 minutes @ 557 mm Hg    Complications- None  Condition-PACU - hemodynamically stable.   Brief Clinical Note  Tara Moreno is a 69 y.o. year old female with end stage OA of both knees with progressively worsening pain and dysfunction. She has constant pain, with activity and at rest and significant functional deficits with difficulties even with ADLs. She has had extensive non-op management including analgesics, injections of cortisone, and home exercise program, but remains in significant pain with significant dysfunction. We discussed replacing both knees in the same setting versus one at a time including procedure, risks, potential complications, rehab course, and pros and cons associated with each and the patient elects to do both knees at the same time. She presents now for Bilateral Total Knee Arthroplasty.     Procedure in detail---   The patient is brought into the operating room and positioned supine on the operating table. After successful administration of  General and Epidural,   a tourniquet is placed high on the  Bilateral thigh(s) and the lower extremities are prepped and draped in the usual sterile fashion. Time out is performed by the operating team and then the  Left lower extremity is wrapped in Esmarch, knee flexed and the tourniquet inflated to 300 mmHg.       A midline incision is made with a ten blade through the subcutaneous tissue to the level of the extensor mechanism. A fresh blade is used to make a medial parapatellar arthrotomy. Soft tissue over the proximal medial tibia is  subperiosteally elevated to the joint line with a knife and into the semimembranosus bursa with a Cobb elevator. Soft tissue over the proximal lateral tibia is elevated with attention being paid to avoiding the patellar tendon on the tibial tubercle. The patella is everted, knee flexed 90 degrees and the ACL and PCL are removed. Findings are bone on bone all 3 compartments with massive global osteophyte formation        The drill is used to create a starting hole in the distal femur and the canal is thoroughly irrigated with sterile saline to remove the fatty contents. The 5 degree Left  valgus alignment guide is placed into the femoral canal and the distal femoral cutting block is pinned to remove 9 mm off the distal femur. Resection is made with an oscillating saw.      The tibia is subluxed forward and the menisci are removed. The extramedullary alignment guide is placed referencing proximally at the medial aspect of the tibial tubercle and distally along the second metatarsal axis and tibial crest. The block is pinned to remove 38mm off the more deficient medial  side. Resection is made with an oscillating saw. Size 5is the most appropriate size for the tibia and the proximal tibia is prepared with the modular drill and keel punch for that size.      The femoral sizing guide is placed and size 5 is most appropriate. Rotation is marked off the epicondylar axis and confirmed by creating a rectangular flexion gap at 90 degrees. The size 5 cutting block is pinned in this  rotation and the anterior, posterior and chamfer cuts are made with the oscillating saw. The intercondylar block is then placed and that cut is made.      Trial size 5 tibial component, trial size 5 posterior stabilized femur and a 8  mm posterior stabilized rotating platform insert trial is placed. Full extension is achieved with excellent varus/valgus and anterior/posterior balance throughout full range of motion. The patella is everted and  thickness measured to be 22  mm. Free hand resection is taken to 12 mm, a 35 template is placed, lug holes are drilled, trial patella is placed, and it tracks normally. Osteophytes are removed off the posterior femur with the trial in place. All trials are removed and the cut bone surfaces prepared with pulsatile lavage. Cement is mixed and once ready for implantation, the size 5 tibial implant, size  5 posterior stabilized femoral component, and the size 35 patella are cemented in place and the patella is held with the clamp. The trial insert is placed and the knee held in full extension.  All extruded cement is removed and once the cement is hard the permanent 8 mm posterior stabilized rotating platform insert is placed into the tibial tray.      The wound is copiously irrigated with saline solution and the extensor mechanism closed over a hemovac drain with #1 V-loc suture. The tourniquet is released for a total tourniquet time of 38  minutes. Flexion against gravity is 140 degrees and the patella tracks normally. Subcutaneous tissue is closed with 2.0 vicryl and subcuticular with running 4.0 Monocryl.       The  Right lower extremity is wrapped in Esmarch, knee flexed and the tourniquet inflated to 300 mmHg.       A midline incision is made with a ten blade through the subcutaneous tissue to the level of the extensor mechanism. A fresh blade is used to make a medial parapatellar arthrotomy. Soft tissue over the proximal medial tibia is subperiosteally elevated to the joint line with a knife and into the semimembranosus bursa with a Cobb elevator. Soft tissue over the proximal lateral tibia is elevated with attention being paid to avoiding the patellar tendon on the tibial tubercle. The patella is everted, knee flexed 90 degrees and the ACL and PCL are removed. Findings are bone on bone all 3 compartments with massive global osteophyte formation.        The drill is used to create a starting hole in the  distal femur and the canal is thoroughly irrigated with sterile saline to remove the fatty contents. The 5 degree Right  valgus alignment guide is placed into the femoral canal and the distal femoral cutting block is pinned to remove 9 mm off the distal femur. Resection is made with an oscillating saw.      The tibia is subluxed forward and the menisci are removed. The extramedullary alignment guide is placed referencing proximally at the medial aspect of the tibial tubercle and distally along the second metatarsal axis and tibial crest. The block is pinned to remove 36mm off the more deficient medial  side. Resection is made with an oscillating saw. Size 5is the most appropriate size for the tibia and the proximal tibia is prepared with the modular drill and keel punch for that size.      The femoral sizing guide is placed and size 5 is most appropriate. Rotation is marked off the epicondylar axis and confirmed by creating a rectangular flexion gap at  90 degrees. The size 5 cutting block is pinned in this rotation and the anterior, posterior and chamfer cuts are made with the oscillating saw. The intercondylar block is then placed and that cut is made.      Trial size 5 tibial component, trial size 5 posterior stabilized femur and a 7  mm posterior stabilized rotating platform insert trial is placed. Full extension is achieved with excellent varus/valgus and anterior/posterior balance throughout full range of motion. The patella is everted and thickness measured to be 22  mm. Free hand resection is taken to 12 mm, a 35 template is placed, lug holes are drilled, trial patella is placed, and it tracks normally. Osteophytes are removed off the posterior femur with the trial in place. All trials are removed and the cut bone surfaces prepared with pulsatile lavage. Cement is mixed and once ready for implantation, the size 5 tibial implant, size  5 posterior stabilized femoral component, and the size 35 patella are  cemented in place and the patella is held with the clamp. The trial insert is placed and the knee held in full extension.   All extruded cement is removed and once the cement is hard the permanent 7 mm posterior stabilized rotating platform insert is placed into the tibial tray.      The wound is copiously irrigated with saline solution and the extensor mechanism closed over a hemovac drain with #1 V-loc suture. The tourniquet is released for a total tourniquet time of 35  minutes. Flexion against gravity is 140 degrees and the patella tracks normally. Subcutaneous tissue is closed with 2.0 vicryl and subcuticular with running 4.0 Monocryl. The incisions are cleaned and dried and steri-strips and  bulky sterile dressings are applied. The limbs are placed into knee immobilizers and the patient is awakened and transported to recovery in stable condition.            Please note that a surgical assistant was a medical necessity for this procedure in order to perform it in a safe and expeditious manner. Surgical assistant was necessary to retract the ligaments and vital neurovascular structures to prevent injury to them and also necessary for proper positioning of the limb to allow for anatomic placement of the prosthesis.   Tara Plover Makyiah Lie, MD    10/09/2014, 12:56 PM

## 2014-10-09 NOTE — Progress Notes (Signed)
PHARMACIST - PHYSICIAN ORDER COMMUNICATION  CONCERNING: P&T Medication Policy on Herbal Medications  DESCRIPTION:  This patient's order for:  Ubiquinol  has been noted.  This product(s) is classified as an "herbal" or natural product. Due to a lack of definitive safety studies or FDA approval, nonstandard manufacturing practices, plus the potential risk of unknown drug-drug interactions while on inpatient medications, the Pharmacy and Therapeutics Committee does not permit the use of "herbal" or natural products of this type within [place].   ACTION TAKEN: The pharmacy department is unable to verify this order at this time and your patient has been informed of this safety policy. Please reevaluate patient's clinical condition at discharge and address if the herbal or natural product(s) should be resumed at that time.   

## 2014-10-09 NOTE — Anesthesia Preprocedure Evaluation (Addendum)
Anesthesia Evaluation  Patient identified by MRN, date of birth, ID band Patient awake    Reviewed: Allergy & Precautions, NPO status , Patient's Chart, lab work & pertinent test results  History of Anesthesia Complications (+) PONV and history of anesthetic complications  Airway Mallampati: II  TM Distance: >3 FB Neck ROM: Full    Dental  (+) Teeth Intact, Dental Advisory Given   Pulmonary neg pulmonary ROS,    Pulmonary exam normal       Cardiovascular hypertension, Pt. on medications Normal cardiovascular exam    Neuro/Psych negative neurological ROS  negative psych ROS   GI/Hepatic Neg liver ROS, GERD-  ,  Endo/Other  Morbid obesity  Renal/GU negative Renal ROS     Musculoskeletal   Abdominal   Peds  Hematology negative hematology ROS (+)   Anesthesia Other Findings   Reproductive/Obstetrics                            Anesthesia Physical Anesthesia Plan  ASA: II  Anesthesia Plan: MAC and Epidural   Post-op Pain Management:    Induction:   Airway Management Planned: Simple Face Mask  Additional Equipment:   Intra-op Plan:   Post-operative Plan:   Informed Consent: I have reviewed the patients History and Physical, chart, labs and discussed the procedure including the risks, benefits and alternatives for the proposed anesthesia with the patient or authorized representative who has indicated his/her understanding and acceptance.   Dental advisory given  Plan Discussed with: CRNA, Anesthesiologist and Surgeon  Anesthesia Plan Comments:        Anesthesia Quick Evaluation

## 2014-10-09 NOTE — Progress Notes (Signed)
ANTICOAGULATION CONSULT NOTE - Initial Consult  Pharmacy Consult for Warfarin Indication: VTE prophylaxis s/p bilateral TKA  No Known Allergies  Patient Measurements: Height: 5\' 7"  (170.2 cm) Weight: 249 lb (112.946 kg) IBW/kg (Calculated) : 61.6 Heparin Dosing Weight:   Vital Signs: Temp: 98.6 F (37 C) (06/01 1530) Temp Source: Oral (06/01 1530) BP: 93/42 mmHg (06/01 1530) Pulse Rate: 85 (06/01 1530)  Labs: No results for input(s): HGB, HCT, PLT, APTT, LABPROT, INR, HEPARINUNFRC, CREATININE, CKTOTAL, CKMB, TROPONINI in the last 72 hours.  Estimated Creatinine Clearance: 89.7 mL/min (by C-G formula based on Cr of 0.7).   Medical History: Past Medical History  Diagnosis Date  . Hypertension   . Arthritis   . PONV (postoperative nausea and vomiting)   . Frequent urination at night   . GERD (gastroesophageal reflux disease)   . Hyperlipemia    Assessment: 22 yoF s/p bilateral TKA 6/1 for end stage OA. Pharmacy consulted to start warfarin with first dose 6/2 @ 10AM . No anticoagulants or antiplatelets PTA.  Pt has indwelling epidural in place.    Baseline PT/INR normal, no issues noted with hemoglobin/hematocrit or platelets.   Regular diet. LFTs WNL.   Goal of Therapy:  INR 2-3 Monitor platelets by anticoagulation protocol: Yes   Plan:  Start warfarin 2.5mg  po x 1 tomorrow @ 10AM.   F/u epidural removal, daily PT/INR, clinical course.   Ralene Bathe, PharmD, BCPS 10/09/2014, 7:57 PM  Pager: [phone]

## 2014-10-09 NOTE — Progress Notes (Signed)
Orthopedic Tech Progress Note Patient Details:  Tara Moreno 1945/07/28 [id number]  CPM Left Knee CPM Left Knee: On Left Knee Flexion (Degrees): 40 Left Knee Extension (Degrees): 0 CPM Right Knee CPM Right Knee: On   [name] 10/09/2014, 2:53 PM

## 2014-10-09 NOTE — Interval H&P Note (Signed)
History and Physical Interval Note:  10/09/2014 10:28 AM  Tara Moreno  has presented today for surgery, with the diagnosis of OA BILATERAL KNEE  The various methods of treatment have been discussed with the patient and family. After consideration of risks, benefits and other options for treatment, the patient has consented to  Procedure(s): BILATERAL TOTAL KNEE ARTHROPLASTY (Bilateral) as a surgical intervention .  The patient's history has been reviewed, patient examined, no change in status, stable for surgery.  I have reviewed the patient's chart and labs.  Questions were answered to the patient's satisfaction.     [name]

## 2014-10-09 NOTE — Progress Notes (Signed)
Orthopedic Tech Progress Note Patient Details:  Tara Moreno [birth year] [id number]  CPM Left Knee CPM Left Knee: On Left Knee Flexion (Degrees): 40 Left Knee Extension (Degrees): 0   [name] 10/09/2014, 2:53 PM

## 2014-10-10 ENCOUNTER — Encounter (HOSPITAL_COMMUNITY): Payer: Self-pay | Admitting: Orthopedic Surgery

## 2014-10-10 DIAGNOSIS — Z96653 Presence of artificial knee joint, bilateral: Secondary | ICD-10-CM

## 2014-10-10 DIAGNOSIS — M170 Bilateral primary osteoarthritis of knee: Principal | ICD-10-CM

## 2014-10-10 LAB — CBC
HCT: 32.5 % — ABNORMAL LOW (ref 36.0–46.0)
Hemoglobin: 10.4 g/dL — ABNORMAL LOW (ref 12.0–15.0)
MCH: 28.5 pg (ref 26.0–34.0)
MCHC: 32 g/dL (ref 30.0–36.0)
MCV: 89 fL (ref 78.0–100.0)
Platelets: 257 10*3/uL (ref 150–400)
RBC: 3.65 MIL/uL — ABNORMAL LOW (ref 3.87–5.11)
RDW: 13.4 % (ref 11.5–15.5)
WBC: 11.8 10*3/uL — ABNORMAL HIGH (ref 4.0–10.5)

## 2014-10-10 LAB — BASIC METABOLIC PANEL
Anion gap: 8 (ref 5–15)
BUN: 14 mg/dL (ref 6–20)
CHLORIDE: 102 mmol/L (ref 101–111)
CO2: 25 mmol/L (ref 22–32)
CREATININE: 0.65 mg/dL (ref 0.44–1.00)
Calcium: 8.6 mg/dL — ABNORMAL LOW (ref 8.9–10.3)
GFR calc Af Amer: >60 mL/min (ref >60)
Glucose, Bld: 139 mg/dL — ABNORMAL HIGH (ref 65–99)
Potassium: 4.4 mmol/L (ref 3.5–5.1)
SODIUM: 135 mmol/L (ref 135–145)

## 2014-10-10 LAB — PROTIME-INR
INR: 1.2 (ref 0.00–1.49)
PROTHROMBIN TIME: 15.4 s — AB (ref 11.6–15.2)

## 2014-10-10 MED ORDER — LIP MEDEX EX OINT
TOPICAL_OINTMENT | CUTANEOUS | Status: AC
  Administered 2014-10-10: 10:00:00
  Filled 2014-10-10: qty 7

## 2014-10-10 MED ORDER — COUMADIN BOOK
Freq: Once | Status: AC
  Administered 2014-10-11: 12:00:00
  Filled 2014-10-10: qty 1

## 2014-10-10 MED ORDER — WARFARIN VIDEO
Freq: Once | Status: AC
  Administered 2014-10-11: 12:00:00

## 2014-10-10 MED ORDER — ROPIVACAINE HCL 2 MG/ML IJ SOLN
12.0000 mL/h | INTRAMUSCULAR | Status: DC
  Administered 2014-10-10: 10 mL/h via EPIDURAL
  Administered 2014-10-10: 12 mL/h via EPIDURAL
  Filled 2014-10-10 (×4): qty 200

## 2014-10-10 NOTE — Progress Notes (Signed)
OT Cancellation Note  Patient Details Name: Tara Moreno MRN: [id number] DOB: 22-Mar-1946   Cancelled Treatment:    Reason Eval/Treat Not Completed: Other (comment).  Pt is moving slowly with PT. Will check back in the morning for OT evaluation  Tara Moreno 10/10/2014, 1:32 PM  Lesle Chris, OTR/L [phone] 10/10/2014

## 2014-10-10 NOTE — Care Management Note (Signed)
Case Management Note  Patient Details  Name: ZELLA DEWAN MRN: [id number] Date of Birth: 10/04/1945  Subjective/Objective:                   BILATERAL TOTAL KNEE ARTHROPLASTY (Bilateral) Action/Plan: Discharge planning  Expected Discharge Date:  10/12/14               Expected Discharge Plan:  [place]  In-House Referral:     Discharge planning Services  CM Consult  Post Acute Care Choice:    Choice offered to:     DME Arranged:    DME Agency:     HH Arranged:    [place] [facility] Agency:     Status of Service:  Completed, signed off  Medicare Important Message Given:    Date Medicare IM Given:    Medicare IM give by:    Date Additional Medicare IM Given:    Additional Medicare Important Message give by:     If discussed at [place] of Stay Meetings, dates discussed:    Additional Comments: CM notes CIR recc and consult placed for eval.  No other CM needs were communicated. Dellie Catholic, RN 10/10/2014, 11:41 AM

## 2014-10-10 NOTE — Progress Notes (Signed)
S: States that right knee is comfortable, but left hurts.  O: INR 1.2    VSS  BP 142/75   Back: nontender, nonred, epidural in place and dressing intact.   Sensory:   Right side   T-6   To ice    Left side  L-3 to ice  A/P: one-sided block. Bolus Ropivacaine 0.2 %   5cc;   Increase rate from 10 to 12 cc/hour.   RN notified. Vital sign protocol initiated.

## 2014-10-10 NOTE — Progress Notes (Signed)
ANTICOAGULATION CONSULT NOTE - Follow Up Consult  Pharmacy Consult for Warfarin Indication: VTE prophylaxis with epidural catheter  No Known Allergies  Patient Measurements: Height: 5\' 7"  (170.2 cm) Weight: 249 lb (112.946 kg) IBW/kg (Calculated) : 61.6  Vital Signs: Temp: 98.6 F (37 C) (06/02 0521) Temp Source: Oral (06/02 0521) BP: 111/62 mmHg (06/02 0521) Pulse Rate: 67 (06/02 0521)  Labs:  Recent Labs  10/10/14 0420  HGB 10.4*  HCT 32.5*  PLT 257  LABPROT 15.4*  INR 1.20  CREATININE 0.65    Estimated Creatinine Clearance: 89.7 mL/min (by C-G formula based on Cr of 0.65).   Medications:  Prescriptions prior to admission  Medication Sig Dispense Refill Last Dose  . amLODipine-olmesartan (AZOR) 10-40 MG per tablet Take 1 tablet by mouth every morning.    10/08/2014 at am  . atorvastatin (LIPITOR) 10 MG tablet Take 10 mg by mouth every morning.    10/07/2014  . CycloSPORINE (RESTASIS OP) Place 1 drop into both eyes daily as needed (dry eyes).    09/28/2014  . Green Tea, Camillia sinensis, (GREEN TEA EXTRACT PO) Take 1 capsule by mouth every morning.    09/23/2014  . MILK THISTLE PO Take 1 capsule by mouth every morning.    09/23/2014  . Multiple Vitamin (MULTIVITAMIN WITH MINERALS) TABS tablet Take 1 tablet by mouth daily.   09/23/2014  . naproxen sodium (ANAPROX) 220 MG tablet Take 220-400 mg by mouth 2 (two) times daily as needed (pain.).   10/02/2014  . niacin 500 MG tablet Take 500 mg by mouth every morning.    09/23/2014  . Omega-3 Fatty Acids (FISH OIL) 1000 MG CAPS Take 3,000 mg by mouth every morning.    09/23/2014  . pantoprazole (PROTONIX) 40 MG tablet Take 40 mg by mouth every morning.    10/08/2014 at am  . Probiotic Product (PROBIOTIC DAILY PO) Take 1 tablet by mouth every morning.    10/08/2014 at am  . UBIQUINOL PO Take 100 mg by mouth every morning.    09/23/2014  . methocarbamol (ROBAXIN) 500 MG tablet Take 1 tablet (500 mg total) by mouth 3 (three) times daily  as needed. (Patient taking differently: Take 500 mg by mouth 3 (three) times daily as needed for muscle spasms. ) 60 tablet 1 More than a month at Unknown time  . methocarbamol (ROBAXIN) 500 MG tablet Take 1 tablet (500 mg total) by mouth 3 (three) times daily as needed. (Patient not taking: Reported on 09/27/2014) 60 tablet 1   . oxyCODONE-acetaminophen (ROXICET) 5-325 MG per tablet Take 1-2 tablets by mouth every 4 (four) hours as needed for severe pain. 60 tablet 0 More than a month at Unknown time  . oxyCODONE-acetaminophen (ROXICET) 5-325 MG per tablet Take 1-2 tablets by mouth every 4 (four) hours as needed for severe pain. (Patient not taking: Reported on 09/27/2014) 60 tablet 0    Scheduled:  . acetaminophen  1,000 mg Oral 4 times per day  . amLODipine  10 mg Oral Daily   And  . irbesartan  300 mg Oral Daily  . atorvastatin  10 mg Oral q morning - 10a  . docusate sodium  100 mg Oral BID  . lip balm      . pantoprazole  40 mg Oral q morning - 10a  . Warfarin - Pharmacist Dosing Inpatient   Does not apply q1800   Assessment: 31 yoF s/p bilateral TKA 6/1 for end stage OA. Pharmacy consulted to start warfarin with  first dose 6/2 @ 10AM . No anticoagulants or antiplatelets PTA. Pt has indwelling epidural in place.  Plan per ortho is to begin lovenox bridging with warfarin until INR > 2   Baseline INR normal  Prior anticoagulation: none  Significant events: 6/1 BL TKA  Today, 10/10/2014:  CBC: H/H low as expected post-op; Plt wnl  INR subtherapeutic (baseline)  Major drug interactions: none  No bleeding issues per nursing  Eating 100% of meals (except during periop NPO period)  Goal of Therapy: INR 2-3  Plan:  Already received warfarin 2.5 mg this AM, NO additional warfarin or lovenox until epidural catheter removed  Daily INR  CBC at least q72 hr while on warfarin  Monitor for signs of bleeding or thrombosis   Reuel Boom, PharmD Pager: [phone] 10/10/2014,  10:04 AM

## 2014-10-10 NOTE — Consult Note (Signed)
Physical Medicine and Rehabilitation Consult Reason for Consult: Bilateral knee replacements Referring Physician: Dr.Alusio    HPI: Tara Moreno is a 69 y.o.right handed  female admitted 10/09/2014 with bilateral knee pain that is progressed over the past 10 years. No relief with conservative care including corticosteroid-induced's and activity modification. Patient independent with a cane prior to admission living with her husband. X-rays and imaging with end-stage osteoarthritis. Underwent bilateral total knee arthroplasty 10/09/2014 per Dr. Maureen Ralphs. Hospital course pain management. Placed on Coumadin for DVT prophylaxis. Acute blood loss anemia 10.4 and monitored. Physical and occupational therapy evaluations pending. M.D. has requested physical medicine rehabilitation consult  Severe pain after first time up with PT post op, had anesthesia come in to turn up epidural infusion  Review of Systems  Gastrointestinal:       GERD  Musculoskeletal: Positive for myalgias and joint pain.  All other systems reviewed and are negative.  Past Medical History  Diagnosis Date  . Hypertension   . Arthritis   . PONV (postoperative nausea and vomiting)   . Frequent urination at night   . GERD (gastroesophageal reflux disease)   . Hyperlipemia    Past Surgical History  Procedure Laterality Date  . Abdominal hysterectomy    . Removal of the renal tumor    . Tonsillectomy    . Partial hysterectomy    . Banectomy    . Colonoscopy    . Breast enhancement surgery  2000  . Cardiac catheterization      no PCI; had before renal tumor was removed  . Revision total shoulder arthroplasty Right 03/08/2014    dr Veverly Fells  . Reverse shoulder arthroplasty Right 03/08/2014    Procedure: RIGHT TOTAL SHOULDER ARTHROPLASTY VERSES A REVERSE ARTHROPLASTY;  Surgeon: Augustin Schooling, MD;  Location: [facility];  Service: Orthopedics;  Laterality: Right;  . Reverse shoulder arthroplasty Left 07/05/2014   Procedure: LEFT REVERSE SHOULDER ARTHROPLASTY;  Surgeon: Augustin Schooling, MD;  Location: [facility];  Service: Orthopedics;  Laterality: Left;   Family History  Problem Relation Age of Onset  . Hypertension Mother   . Hypertension Father   . Hypertension Sister   . Hypertension Brother   . Hypertension Other    Social History:  reports that she has never smoked. She has never used smokeless tobacco. She reports that she does not drink alcohol or use illicit drugs. Allergies: No Known Allergies Medications Prior to Admission  Medication Sig Dispense Refill  . amLODipine-olmesartan (AZOR) 10-40 MG per tablet Take 1 tablet by mouth every morning.     Marland Kitchen atorvastatin (LIPITOR) 10 MG tablet Take 10 mg by mouth every morning.     . CycloSPORINE (RESTASIS OP) Place 1 drop into both eyes daily as needed (dry eyes).     Nyoka Cowden Tea, Camillia sinensis, (GREEN TEA EXTRACT PO) Take 1 capsule by mouth every morning.     Marland Kitchen MILK THISTLE PO Take 1 capsule by mouth every morning.     . Multiple Vitamin (MULTIVITAMIN WITH MINERALS) TABS tablet Take 1 tablet by mouth daily.    . naproxen sodium (ANAPROX) 220 MG tablet Take 220-400 mg by mouth 2 (two) times daily as needed (pain.).    Marland Kitchen niacin 500 MG tablet Take 500 mg by mouth every morning.     . Omega-3 Fatty Acids (FISH OIL) 1000 MG CAPS Take 3,000 mg by mouth every morning.     . pantoprazole (PROTONIX) 40 MG tablet Take 40  mg by mouth every morning.     . Probiotic Product (PROBIOTIC DAILY PO) Take 1 tablet by mouth every morning.     Marland Kitchen UBIQUINOL PO Take 100 mg by mouth every morning.     . methocarbamol (ROBAXIN) 500 MG tablet Take 1 tablet (500 mg total) by mouth 3 (three) times daily as needed. (Patient taking differently: Take 500 mg by mouth 3 (three) times daily as needed for muscle spasms. ) 60 tablet 1  . methocarbamol (ROBAXIN) 500 MG tablet Take 1 tablet (500 mg total) by mouth 3 (three) times daily as needed. (Patient not taking: Reported on  09/27/2014) 60 tablet 1  . oxyCODONE-acetaminophen (ROXICET) 5-325 MG per tablet Take 1-2 tablets by mouth every 4 (four) hours as needed for severe pain. 60 tablet 0  . oxyCODONE-acetaminophen (ROXICET) 5-325 MG per tablet Take 1-2 tablets by mouth every 4 (four) hours as needed for severe pain. (Patient not taking: Reported on 09/27/2014) 60 tablet 0    Home: Home Living Family/patient expects to be discharged to:: Inpatient rehab Living Arrangements: Spouse/significant other  Functional History:   Functional Status:  Mobility:          ADL:    Cognition: Cognition Orientation Level: Oriented X4    Blood pressure 111/62, pulse 67, temperature 98.6 F (37 C), temperature source Oral, resp. rate 18, height 5\' 7"  (1.702 m), weight 112.946 kg (249 lb), SpO2 99 %. Physical Exam  Constitutional: She is oriented to person, place, and time. She appears well-developed.  HENT:  Head: Normocephalic.  Eyes: EOM are normal.  Neck: Normal range of motion. Neck supple. No thyromegaly present.  Cardiovascular: Normal rate and regular rhythm.   Respiratory: Effort normal and breath sounds normal. No respiratory distress.  GI: Soft. Bowel sounds are normal. She exhibits no distension.  Neurological: She is alert and oriented to person, place, and time.  Skin:  Bilateral knee incisions clean and dry appropriately tender  Motor 4/5 in bilateral delt, bi, tri , grip ankle DF and PF are 3/5,  prox LE not tested , epidural plus pain plus KI with ice bilateral knees Sensation intact LT in bilateral feet  Results for orders placed or performed during the hospital encounter of 10/09/14 (from the past 24 hour(s))  CBC     Status: Abnormal   Collection Time: 10/10/14  4:20 AM  Result Value Ref Range   WBC 11.8 (H) 4.0 - 10.5 K/uL   RBC 3.65 (L) 3.87 - 5.11 MIL/uL   Hemoglobin 10.4 (L) 12.0 - 15.0 g/dL   HCT 32.5 (L) 36.0 - 46.0 %   MCV 89.0 78.0 - 100.0 fL   MCH 28.5 26.0 - 34.0 pg   MCHC  32.0 30.0 - 36.0 g/dL   RDW 13.4 11.5 - 15.5 %   Platelets 257 150 - 400 K/uL  Basic metabolic panel     Status: Abnormal   Collection Time: 10/10/14  4:20 AM  Result Value Ref Range   Sodium 135 135 - 145 mmol/L   Potassium 4.4 3.5 - 5.1 mmol/L   Chloride 102 101 - 111 mmol/L   CO2 25 22 - 32 mmol/L   Glucose, Bld 139 (H) 65 - 99 mg/dL   BUN 14 6 - 20 mg/dL   Creatinine, Ser 0.65 0.44 - 1.00 mg/dL   Calcium 8.6 (L) 8.9 - 10.3 mg/dL   GFR calc non Af Amer >60 >60 mL/min   GFR calc Af Amer >60 >60 mL/min  Anion gap 8 5 - 15  Protime-INR     Status: Abnormal   Collection Time: 10/10/14  4:20 AM  Result Value Ref Range   Prothrombin Time 15.4 (H) 11.6 - 15.2 seconds   INR 1.20 0.00 - 1.49   No results found.  Assessment/Plan: Diagnosis: endstage OA bilateral knees, s/p B TKR POD #1 1. Does the need for close, 24 hr/day medical supervision in concert with the patient's rehab needs make it unreasonable for this patient to be served in a less intensive setting? Yes 2. Co-Morbidities requiring supervision/potential complications: ABLA now on anticoagulation,HTN 3. Due to bladder management, bowel management, safety, skin/wound care, disease management, medication administration, pain management and patient education, does the patient require 24 hr/day rehab nursing? Yes 4. Does the patient require coordinated care of a physician, rehab nurse, PT (1-2 hrs/day, 5 days/week) and OT (1-2 hrs/day, 5 days/week) to address physical and functional deficits in the context of the above medical diagnosis(es)? Yes Addressing deficits in the following areas: balance, endurance, locomotion, strength, transferring, bowel/bladder control, bathing, dressing, feeding, grooming and toileting 5. Can the patient actively participate in an intensive therapy program of at least 3 hrs of therapy per day at least 5 days per week? Potentially 6. The potential for patient to make measurable gains while on inpatient  rehab is good 7. Anticipated functional outcomes upon discharge from inpatient rehab are modified independent  with PT, modified independent with OT, n/a with SLP. 8. Estimated rehab length of stay to reach the above functional goals is: 7-10d 9. Does the patient have adequate social supports and living environment to accommodate these discharge functional goals? Potentially 10. Anticipated D/C setting: Home 11. Anticipated post D/C treatments: [place] therapy 12. Overall Rehab/Functional Prognosis: good  RECOMMENDATIONS: This patient's condition is appropriate for continued rehabilitative care in the following setting: CIR Patient has agreed to participate in recommended program. Yes Note that insurance prior authorization may be required for reimbursement for recommended care.  Comment: Rehab AC will f/u on functional status with PT after epidural is D/Ced , if at Sup level then would rec home with [facility], otherwise anticipate CIR once stable per Ortho    10/10/2014

## 2014-10-10 NOTE — Evaluation (Signed)
Physical Therapy Evaluation Patient Details Name: Tara Moreno MRN: [id number] DOB: [birth year] Today's Date: 10/10/2014   History of Present Illness  Bil TKR ; s/p left reverse total shoulder ~3 months ago, had right reverse total shoulder in Oct 2015.  Clinical Impression  Pt s/p Bil TKR presents with decreased Bil LE strength/ROM, limited use of Bil UEs, obesity, and post op pain severely limiting functional mobility.  Pt would benefit greatly from follow up rehab at CIR level to maximize IND for return home.    Follow Up Recommendations CIR    Equipment Recommendations  None recommended by PT    Recommendations for Other Services OT consult     Precautions / Restrictions Precautions Precautions: Knee;Fall;Other (comment) Precaution Comments: Pt not WB on UEs - requesting clarification of limitations from shoulder surgeon Required Braces or Orthoses: Knee Immobilizer - Right;Knee Immobilizer - Left Knee Immobilizer - Right: Discontinue once straight leg raise with < 10 degree lag Knee Immobilizer - Left: Discontinue once straight leg raise with < 10 degree lag Restrictions Weight Bearing Restrictions: No Other Position/Activity Restrictions: WBAT      Mobility  Bed Mobility Overal bed mobility: Needs Assistance Bed Mobility: Supine to Sit;Sit to Supine     Supine to sit: Max assist;+2 for physical assistance Sit to supine: Max assist;+2 for physical assistance   General bed mobility comments: cues for sequence; pt using UEs minially to self assist  Transfers Overall transfer level: Needs assistance Equipment used: Rolling walker (2 wheeled) Transfers: Sit to/from Stand Sit to Stand: +2 physical assistance;+2 safety/equipment;From elevated surface         General transfer comment: +3 assist.  Utilized bed to stand pt 90% 2* pt inability to utilize UEs.  Ambulation/Gait   Ambulation Distance (Feet): 0 Feet Assistive device: Rolling walker (2 wheeled)        General Gait Details: Unable to initiate step.  Pt not WB either UE  Stairs            Wheelchair Mobility    Modified Rankin (Stroke Patients Only)       Balance                                             Pertinent Vitals/Pain Pain Assessment: 0-10 Pain Score: 7  Pain Location: Bil knees - L >R Pain Descriptors / Indicators: Aching;Sore Pain Intervention(s): Limited activity within patient's tolerance;Monitored during session;Premedicated before session;Patient requesting pain meds-RN notified;Ice applied    Home Living Family/patient expects to be discharged to:: Inpatient rehab                      Prior Function Level of Independence: Independent;Independent with assistive device(s)               Hand Dominance   Dominant Hand: Right    Extremity/Trunk Assessment   Upper Extremity Assessment: RUE deficits/detail;LUE deficits/detail RUE Deficits / Details: AROM WFL with ~4/5 strength.  Pt using minimally to self assist     LUE Deficits / Details: AROM WFL with limited shoulder elevation to ~90.  Strength 3+/5 but pt using minimally to self assist   Lower Extremity Assessment: RLE deficits/detail;LLE deficits/detail RLE Deficits / Details: 2+/5 quads with AAROM at knee-10- 50 LLE Deficits / Details: 2/5 quads with AAROM at knee -10- 40  Cervical / Trunk Assessment: Normal  Communication   Communication: No difficulties  Cognition Arousal/Alertness: Awake/alert Behavior During Therapy: WFL for tasks assessed/performed Overall Cognitive Status: Within Functional Limits for tasks assessed                      General Comments      Exercises Total Joint Exercises Ankle Circles/Pumps: AROM;Both;20 reps;Supine Quad Sets: AROM;Both;10 reps;Supine Heel Slides: AAROM;Both;10 reps;Supine Straight Leg Raises: AAROM;Both;10 reps;Supine      Assessment/Plan    PT Assessment Patient needs continued PT services   PT Diagnosis Difficulty walking   PT Problem List Decreased strength;Decreased range of motion;Decreased activity tolerance;Decreased mobility;Decreased knowledge of use of DME;Decreased knowledge of precautions;Obesity;Pain  PT Treatment Interventions DME instruction;Gait training;Functional mobility training;Therapeutic activities;Therapeutic exercise;Patient/family education   PT Goals (Current goals can be found in the Care Plan section) Acute Rehab PT Goals Patient Stated Goal: Rehab and then home to walk without pain PT Goal Formulation: With patient Time For Goal Achievement: 10/17/14 Potential to Achieve Goals: Fair    Frequency 7X/week   Barriers to discharge        Co-evaluation               End of Session Equipment Utilized During Treatment: Right knee immobilizer;Left knee immobilizer Activity Tolerance: Patient tolerated treatment well;Patient limited by fatigue;Patient limited by pain Patient left: in bed;with call bell/phone within reach Nurse Communication: Mobility status         Time: 4709-6283 PT Time Calculation (min) (ACUTE ONLY): 38 min   Charges:   PT Evaluation $Initial PT Evaluation Tier I: 1 Procedure PT Treatments $Gait Training: 8-22 mins $Therapeutic Exercise: 8-22 mins   PT G Codes:        Starlin Steib 22-Oct-2014, 4:39 PM

## 2014-10-10 NOTE — Discharge Instructions (Addendum)
° °Dr. Frank Aluisio °Total Joint Specialist °[facility] °[address] °[place], [place] [address] °[phone] ° °TOTAL KNEE REPLACEMENT POSTOPERATIVE DIRECTIONS ° °Knee Rehabilitation, Guidelines Following Surgery  °Results after knee surgery are often greatly improved when you follow the exercise, range of motion and muscle strengthening exercises prescribed by your doctor. Safety measures are also important to protect the knee from further injury. Any time any of these exercises cause you to have increased pain or swelling in your knee joint, decrease the amount until you are comfortable again and slowly increase them. If you have problems or questions, call your caregiver or physical therapist for advice.  ° °HOME CARE INSTRUCTIONS  °Remove items at home which could result in a fall. This includes throw rugs or furniture in walking pathways.  °· ICE to the affected knee every three hours for 30 minutes at a time and then as needed for pain and swelling.  Continue to use ice on the knee for pain and swelling from surgery. You may notice swelling that will progress down to the foot and ankle.  This is normal after surgery.  Elevate the leg when you are not up walking on it.   °· Continue to use the breathing machine which will help keep your temperature down.  It is common for your temperature to cycle up and down following surgery, especially at night when you are not up moving around and exerting yourself.  The breathing machine keeps your lungs expanded and your temperature down. °· Do not place pillow under knee, focus on keeping the knee straight while resting ° °DIET °You may resume your previous home diet once your are discharged from the hospital. ° °DRESSING / WOUND CARE / SHOWERING °You may shower 3 days after surgery, but keep the wounds dry during showering.  You may use an occlusive plastic wrap (Press'n Seal for example), NO SOAKING/SUBMERGING IN THE BATHTUB.  If the  bandage gets wet, change with a clean dry gauze.  If the incision gets wet, pat the wound dry with a clean towel. °You may start showering once you are discharged home but do not submerge the incision under water. Just pat the incision dry and apply a dry gauze dressing on daily. °Change the surgical dressing daily and reapply a dry dressing each time. ° °ACTIVITY °Walk with your walker as instructed. °Use walker as long as suggested by your caregivers. °Avoid periods of inactivity such as sitting longer than an hour when not asleep. This helps prevent blood clots.  °You may resume a sexual relationship in one month or when given the OK by your doctor.  °You may return to work once you are cleared by your doctor.  °Do not drive a car for 6 weeks or until released by you surgeon.  °Do not drive while taking narcotics. ° °WEIGHT BEARING °Weight bearing as tolerated with assist device (walker, cane, etc) as directed, use it as long as suggested by your surgeon or therapist, typically at least 4-6 weeks. ° °POSTOPERATIVE CONSTIPATION PROTOCOL °Constipation - defined medically as fewer than three stools per week and severe constipation as less than one stool per week. ° °One of the most common issues patients have following surgery is constipation.  Even if you have a regular bowel pattern at home, your normal regimen is likely to be disrupted due to multiple reasons following surgery.  Combination of anesthesia, postoperative narcotics, change in appetite and fluid intake all can affect your bowels.    In order to avoid complications following surgery, here are some recommendations in order to help you during your recovery period. ° °Colace (docusate) - Pick up an over-the-counter form of Colace or another stool softener and take twice a day as long as you are requiring postoperative pain medications.  Take with a full glass of water daily.  If you experience loose stools or diarrhea, hold the colace until you stool forms  back up.  If your symptoms do not get better within 1 week or if they get worse, check with your doctor. ° °Dulcolax (bisacodyl) - Pick up over-the-counter and take as directed by the product packaging as needed to assist with the movement of your bowels.  Take with a full glass of water.  Use this product as needed if not relieved by Colace only.  ° °MiraLax (polyethylene glycol) - Pick up over-the-counter to have on hand.  MiraLax is a solution that will increase the amount of water in your bowels to assist with bowel movements.  Take as directed and can mix with a glass of water, juice, soda, coffee, or tea.  Take if you go more than two days without a movement. °Do not use MiraLax more than once per day. Call your doctor if you are still constipated or irregular after using this medication for 7 days in a row. ° °If you continue to have problems with postoperative constipation, please contact the office for further assistance and recommendations.  If you experience "the worst abdominal pain ever" or develop nausea or vomiting, please contact the office immediatly for further recommendations for treatment. ° °ITCHING ° If you experience itching with your medications, try taking only a single pain pill, or even half a pain pill at a time.  You can also use Benadryl over the counter for itching or also to help with sleep.  ° °TED HOSE STOCKINGS °Wear the elastic stockings on both legs for three weeks following surgery during the day but you may remove then at night for sleeping. ° °MEDICATIONS °See your medication summary on the “After Visit Summary” that the nursing staff will review with you prior to discharge.  You may have some home medications which will be placed on hold until you complete the course of blood thinner medication.  It is important for you to complete the blood thinner medication as prescribed by your surgeon.  Continue your approved medications as instructed at time of  discharge. ° °PRECAUTIONS °If you experience chest pain or shortness of breath - call 911 immediately for transfer to the hospital emergency department.  °If you develop a fever greater that 101 F, purulent drainage from wound, increased redness or drainage from wound, foul odor from the wound/dressing, or calf pain - CONTACT YOUR SURGEON.   °                                                °FOLLOW-UP APPOINTMENTS °Make sure you keep all of your appointments after your operation with your surgeon and caregivers. You should call the office at the above phone number and make an appointment for approximately two weeks after the date of your surgery or on the date instructed by your surgeon outlined in the "After Visit Summary". ° ° °RANGE OF MOTION AND STRENGTHENING EXERCISES  °Rehabilitation of the knee is important following a knee injury or   an operation. After just a few days of immobilization, the muscles of the thigh which control the knee become weakened and shrink (atrophy). Knee exercises are designed to build up the tone and strength of the thigh muscles and to improve knee motion. Often times heat used for twenty to thirty minutes before working out will loosen up your tissues and help with improving the range of motion but do not use heat for the first two weeks following surgery. These exercises can be done on a training (exercise) mat, on the floor, on a table or on a bed. Use what ever works the best and is most comfortable for you Knee exercises include:  Leg Lifts - While your knee is still immobilized in a splint or cast, you can do straight leg raises. Lift the leg to 60 degrees, hold for 3 sec, and slowly lower the leg. Repeat 10-20 times 2-3 times daily. Perform this exercise against resistance later as your knee gets better.  Quad and Hamstring Sets - Tighten up the muscle on the front of the thigh (Quad) and hold for 5-10 sec. Repeat this 10-20 times hourly. Hamstring sets are done by pushing the  foot backward against an object and holding for 5-10 sec. Repeat as with quad sets.   Leg Slides: Lying on your back, slowly slide your foot toward your buttocks, bending your knee up off the floor (only go as far as is comfortable). Then slowly slide your foot back down until your leg is flat on the floor again.  Angel Wings: Lying on your back spread your legs to the side as far apart as you can without causing discomfort.  A rehabilitation program following serious knee injuries can speed recovery and prevent re-injury in the future due to weakened muscles. Contact your doctor or a physical therapist for more information on knee rehabilitation.   IF YOU ARE TRANSFERRED TO A SKILLED REHAB FACILITY  If the patient is transferred to a skilled rehab facility following release from the hospital, a list of the current medications will be sent to the facility for the patient to continue.  When discharged from the skilled rehab facility, please have the facility set up the patient's [place] prior to being released. Also, the skilled facility will be responsible for providing the patient with their medications at time of release from the facility to include their pain medication, the muscle relaxants, and their blood thinner medication. If the patient is still at the rehab facility at time of the two week follow up appointment, the skilled rehab facility will also need to assist the patient in arranging follow up appointment in our office and any transportation needs.  MAKE SURE YOU:  Understand these instructions.  Get help right away if you are not doing well or get worse.    Pick up stool softner and laxative for home use following surgery while on pain medications. Do not submerge incision under water. Please use good hand washing techniques while changing dressing each day. May shower starting three days after surgery. Please use a clean towel to pat the incision dry following  showers. Continue to use ice for pain and swelling after surgery. Do not use any lotions or creams on the incision until instructed by your surgeon.  Take Coumadin for four weeks and then discontinue.  The dose may need to be adjusted based upon the INR.  Please follow the INR and titrate Coumadin dose for a therapeutic range between 2.0  and 3.0 INR.  After completing the four weeks of Coumadin, the patient may stop the Coumadin and then take an 81 mg Aspirin daily for three more weeks.  Continue Lovenox injections until the INR is therapeutic at or greater than 2.0.  When INR reaches the therapeutic level of equal to or greater than 2.0, the patient may discontinue the Lovenox injections.   Information on my medicine - Coumadin   (Warfarin)  This medication education was reviewed with me or my healthcare representative as part of my discharge preparation.  The pharmacist that spoke with me during my hospital stay was:  WOFFORD, DREW A, RPH  Why was Coumadin prescribed for you? Coumadin was prescribed for you because you have a blood clot or a medical condition that can cause an increased risk of forming blood clots. Blood clots can cause serious health problems by blocking the flow of blood to the heart, lung, or brain. Coumadin can prevent harmful blood clots from forming. As a reminder your indication for Coumadin is:   Blood Clot Prevention After Orthopedic Surgery  What test will check on my response to Coumadin? While on Coumadin (warfarin) you will need to have an INR test regularly to ensure that your dose is keeping you in the desired range. The INR (international normalized ratio) number is calculated from the result of the laboratory test called prothrombin time (PT).  If an INR APPOINTMENT HAS NOT ALREADY BEEN MADE FOR YOU please schedule an appointment to have this lab work done by your health care provider within 7 days. Your INR goal is usually a number between:  2 to 3 or your  provider may give you a more narrow range like 2-2.5.  Ask your health care provider during an office visit what your goal INR is.  What  do you need to  know  About  COUMADIN? Take Coumadin (warfarin) exactly as prescribed by your healthcare provider about the same time each day.  DO NOT stop taking without talking to the doctor who prescribed the medication.  Stopping without other blood clot prevention medication to take the place of Coumadin may increase your risk of developing a new clot or stroke.  Get refills before you run out.  What do you do if you miss a dose? If you miss a dose, take it as soon as you remember on the same day then continue your regularly scheduled regimen the next day.  Do not take two doses of Coumadin at the same time.  Important Safety Information A possible side effect of Coumadin (Warfarin) is an increased risk of bleeding. You should call your healthcare provider right away if you experience any of the following: ? Bleeding from an injury or your nose that does not stop. ? Unusual colored urine (red or dark brown) or unusual colored stools (red or black). ? Unusual bruising for unknown reasons. ? A serious fall or if you hit your head (even if there is no bleeding).  Some foods or medicines interact with Coumadin (warfarin) and might alter your response to warfarin. To help avoid this: ? Eat a balanced diet, maintaining a consistent amount of Vitamin K. ? Notify your provider about major diet changes you plan to make. ? Avoid alcohol or limit your intake to 1 drink for women and 2 drinks for men per day. (1 drink is 5 oz. wine, 12 oz. beer, or 1.5 oz. liquor.)  Make sure that ANY health care provider who prescribes medication for  you knows that you are taking Coumadin (warfarin).  Also make sure the healthcare provider who is monitoring your Coumadin knows when you have started a new medication including herbals and non-prescription products.  Coumadin  (Warfarin)  Major Drug Interactions  Increased Warfarin Effect Decreased Warfarin Effect  Alcohol (large quantities) Antibiotics (esp. Septra/Bactrim, Flagyl, Cipro) Amiodarone (Cordarone) Aspirin (ASA) Cimetidine (Tagamet) Megestrol (Megace) NSAIDs (ibuprofen, naproxen, etc.) Piroxicam (Feldene) Propafenone (Rythmol SR) Propranolol (Inderal) Isoniazid (INH) Posaconazole (Noxafil) Barbiturates (Phenobarbital) Carbamazepine (Tegretol) Chlordiazepoxide (Librium) Cholestyramine (Questran) Griseofulvin Oral Contraceptives Rifampin Sucralfate (Carafate) Vitamin K   Coumadin (Warfarin) Major Herbal Interactions  Increased Warfarin Effect Decreased Warfarin Effect  Garlic Ginseng Ginkgo biloba Coenzyme Q10 Green tea St. Johns wort    Coumadin (Warfarin) FOOD Interactions  Eat a consistent number of servings per week of foods HIGH in Vitamin K (1 serving =  cup)  Collards (cooked, or boiled & drained) Kale (cooked, or boiled & drained) Mustard greens (cooked, or boiled & drained) Parsley *serving size only =  cup Spinach (cooked, or boiled & drained) Swiss chard (cooked, or boiled & drained) Turnip greens (cooked, or boiled & drained)  Eat a consistent number of servings per week of foods MEDIUM-HIGH in Vitamin K (1 serving = 1 cup)  Asparagus (cooked, or boiled & drained) Broccoli (cooked, boiled & drained, or raw & chopped) Brussel sprouts (cooked, or boiled & drained) *serving size only =  cup Lettuce, raw (green leaf, endive, romaine) Spinach, raw Turnip greens, raw & chopped   These websites have more information on Coumadin (warfarin):  FailFactory.se; VeganReport.com.au;

## 2014-10-10 NOTE — Addendum Note (Signed)
Addendum  created 10/10/14 1330 by Franne Grip, MD   Modules edited: Orders

## 2014-10-10 NOTE — Progress Notes (Signed)
Subjective: 1 Day Post-Op Procedure(s) (LRB): BILATERAL TOTAL KNEE ARTHROPLASTY (Bilateral) Patient reports pain as moderate.   Patient seen in rounds with Dr. Wynelle Link.  The left leg is hurting more so than the right leg.  Severe pain last night but improved with IV push and oral pain medications.  Doing better this morning on rounds.  Denies nausea, CP, or SOB. Patient is well, but has had some minor complaints of pain in the knee, requiring pain medications We will start therapy today.  Plan is to go CIR after hospital stay.  Objective: Vital signs in last 24 hours: Temp:  [97.5 F (36.4 C)-98.8 F (37.1 C)] 98.6 F (37 C) (06/02 0521) Pulse Rate:  [65-96] 67 (06/02 0521) Resp:  [9-24] 18 (06/02 0524) BP: (93-174)/(42-96) 111/62 mmHg (06/02 0521) SpO2:  [94 %-100 %] 99 % (06/02 0524) Weight:  [112.946 kg (249 lb)] 112.946 kg (249 lb) (06/01 0906)  Intake/Output from previous day:  Intake/Output Summary (Last 24 hours) at 10/10/14 0748 Last data filed at 10/10/14 0522  Gross per 24 hour  Intake   3935 ml  Output   3165 ml  Net    770 ml    Intake/Output this shift: UOP 1300 since around MN  Labs:  Recent Labs  10/10/14 0420  HGB 10.4*    Recent Labs  10/10/14 0420  WBC 11.8*  RBC 3.65*  HCT 32.5*  PLT 257    Recent Labs  10/10/14 0420  NA 135  K 4.4  CL 102  CO2 25  BUN 14  CREATININE 0.65  GLUCOSE 139*  CALCIUM 8.6*    Recent Labs  10/10/14 0420  INR 1.20    EXAM General - Patient is Alert, Appropriate and Oriented Extremity - Neurovascular intact Sensation intact distally Dorsiflexion/Plantar flexion intact Dressing - dressing C/D/I Motor Function - intact, moving feet and toes well on exam.  Both Hemovacs pulled without difficulty.  Past Medical History  Diagnosis Date  . Hypertension   . Arthritis   . PONV (postoperative nausea and vomiting)   . Frequent urination at night   . GERD (gastroesophageal reflux disease)   .  Hyperlipemia     Assessment/Plan: 1 Day Post-Op Procedure(s) (LRB): BILATERAL TOTAL KNEE ARTHROPLASTY (Bilateral) Principal Problem:   OA (osteoarthritis) of knee  Estimated body mass index is 38.99 kg/(m^2) as calculated from the following:   Height as of this encounter: 5\' 7"  (1.702 m).   Weight as of this encounter: 112.946 kg (249 lb). Advance diet Up with therapy Continue foley due to indwelling epidural catherter. Continue foley for now.  Will keep foley until tomorrow and will not be removed until at least 6-8 hours following the removal of the epidural catheter.  DVT Prophylaxis - Lovenox and Coumadin, Lovenox will not start until tomorrow afternoon following removal of the epidural. First dose of Coumadin this evening. Weight-Bearing as tolerated to both leg  Continue O2 and Pulse OX   Take Coumadin for four weeks and then discontinue.  The dose may need to be adjusted based upon the INR.  Please follow the INR and titrate Coumadin dose for a therapeutic range between 2.0 and 3.0 INR.  After completing the four weeks of Coumadin, the patient may stop the Coumadin and resume their 81 mg Aspirin daily.  Lovenox injections will start tomorrow evening after the epidural has been removed and continue until the INR is therapeutic at or greater than 2.0.  When INR reaches the therapeutic level of  equal to or greater than 2.0, the patient may discontinue the Lovenox injections.  [name], PA-C Orthopaedic Surgery 10/10/2014, 7:48 AM

## 2014-10-10 NOTE — Addendum Note (Signed)
Addendum  created 10/10/14 1321 by Franne Grip, MD   Modules edited: Clinical Notes   Clinical Notes:  File: [id number]

## 2014-10-11 LAB — CBC
HCT: 30.8 % — ABNORMAL LOW (ref 36.0–46.0)
HEMOGLOBIN: 10.1 g/dL — AB (ref 12.0–15.0)
MCH: 29.3 pg (ref 26.0–34.0)
MCHC: 32.8 g/dL (ref 30.0–36.0)
MCV: 89.3 fL (ref 78.0–100.0)
PLATELETS: 240 10*3/uL (ref 150–400)
RBC: 3.45 MIL/uL — ABNORMAL LOW (ref 3.87–5.11)
RDW: 13.7 % (ref 11.5–15.5)
WBC: 11.5 10*3/uL — AB (ref 4.0–10.5)

## 2014-10-11 LAB — BASIC METABOLIC PANEL
Anion gap: 8 (ref 5–15)
BUN: 11 mg/dL (ref 6–20)
CO2: 28 mmol/L (ref 22–32)
CREATININE: 0.71 mg/dL (ref 0.44–1.00)
Calcium: 8.8 mg/dL — ABNORMAL LOW (ref 8.9–10.3)
Chloride: 102 mmol/L (ref 101–111)
GFR calc Af Amer: >60 mL/min (ref >60)
GFR calc non Af Amer: >60 mL/min (ref >60)
Glucose, Bld: 111 mg/dL — ABNORMAL HIGH (ref 65–99)
Potassium: 4.2 mmol/L (ref 3.5–5.1)
Sodium: 138 mmol/L (ref 135–145)

## 2014-10-11 LAB — PROTIME-INR
INR: 1.1 (ref 0.00–1.49)
PROTHROMBIN TIME: 14.4 s (ref 11.6–15.2)

## 2014-10-11 MED ORDER — ACETAMINOPHEN 325 MG PO TABS: 650.0000 mg | ORAL_TABLET | Freq: Four times a day (QID) | ORAL | Status: DC | PRN

## 2014-10-11 MED ORDER — SODIUM CHLORIDE 0.9 % IV BOLUS (SEPSIS)
250.0000 mL | Freq: Once | INTRAVENOUS | Status: AC
  Administered 2014-10-11: 250 mL via INTRAVENOUS

## 2014-10-11 MED ORDER — METHOCARBAMOL 500 MG PO TABS: 500.0000 mg | ORAL_TABLET | Freq: Four times a day (QID) | ORAL | Status: DC | PRN

## 2014-10-11 MED ORDER — WARFARIN SODIUM 7.5 MG PO TABS
7.5000 mg | ORAL_TABLET | Freq: Once | ORAL | Status: AC
  Administered 2014-10-11: 7.5 mg via ORAL
  Filled 2014-10-11: qty 1

## 2014-10-11 MED ORDER — DOCUSATE SODIUM 100 MG PO CAPS: 100.0000 mg | ORAL_CAPSULE | Freq: Two times a day (BID) | ORAL | Status: DC

## 2014-10-11 MED ORDER — SODIUM CHLORIDE 0.9 % IV BOLUS (SEPSIS)
500.0000 mL | Freq: Once | INTRAVENOUS | Status: AC
  Administered 2014-10-11: 500 mL via INTRAVENOUS

## 2014-10-11 MED ORDER — METOCLOPRAMIDE HCL 5 MG PO TABS: 5.0000 mg | ORAL_TABLET | Freq: Three times a day (TID) | ORAL | Status: DC | PRN

## 2014-10-11 MED ORDER — WARFARIN SODIUM 5 MG PO TABS: 5.0000 mg | ORAL_TABLET | Freq: Every day | ORAL | Status: DC

## 2014-10-11 MED ORDER — ENOXAPARIN SODIUM 30 MG/0.3ML ~~LOC~~ SOLN: 30.0000 mg | Freq: Two times a day (BID) | SUBCUTANEOUS | Status: DC

## 2014-10-11 MED ORDER — ONDANSETRON HCL 4 MG PO TABS: 4.0000 mg | ORAL_TABLET | Freq: Four times a day (QID) | ORAL | Status: DC | PRN

## 2014-10-11 MED ORDER — SODIUM CHLORIDE 0.9 % IV SOLN
INTRAVENOUS | Status: DC
  Administered 2014-10-11 – 2014-10-13 (×3): via INTRAVENOUS

## 2014-10-11 MED ORDER — OXYCODONE HCL 5 MG PO TABS: 5.0000 mg | ORAL_TABLET | ORAL | Status: DC | PRN

## 2014-10-11 MED ORDER — TRAMADOL HCL 50 MG PO TABS: 50.0000 mg | ORAL_TABLET | Freq: Four times a day (QID) | ORAL | Status: DC | PRN

## 2014-10-11 MED ORDER — BISACODYL 10 MG RE SUPP: 10.0000 mg | Freq: Every day | RECTAL | Status: DC | PRN

## 2014-10-11 MED ORDER — ENOXAPARIN SODIUM 30 MG/0.3ML ~~LOC~~ SOLN
30.0000 mg | Freq: Two times a day (BID) | SUBCUTANEOUS | Status: DC
  Administered 2014-10-12 – 2014-10-15 (×8): 30 mg via SUBCUTANEOUS
  Filled 2014-10-11 (×9): qty 0.3

## 2014-10-11 MED ORDER — POLYETHYLENE GLYCOL 3350 17 G PO PACK: 17.0000 g | PACK | Freq: Every day | ORAL | Status: DC | PRN

## 2014-10-11 NOTE — Progress Notes (Signed)
Rehab admissions - I have opened the case with [facility] medicare requesting acute inpatient rehab admission for tomorrow.  I am not sure when we will hear back from [facility].  I will inform all as soon as I know something.  I will see patient later today.  Call me for questions.  #[phone]

## 2014-10-11 NOTE — Progress Notes (Signed)
PT Cancellation Note  Patient Details Name: Tara Moreno MRN: [id number] DOB: 05-27-1945   Cancelled Treatment:     PT pm session deferred 2* decreased BP.  Will follow in am.   Kalise Fickett 10/11/2014, 5:11 PM

## 2014-10-11 NOTE — Progress Notes (Signed)
ANTICOAGULATION CONSULT NOTE - Follow Up Consult  Pharmacy Consult for Warfarin Indication: VTE prophylaxis   No Known Allergies  Patient Measurements: Height: 5\' 7"  (170.2 cm) Weight: 249 lb (112.946 kg) IBW/kg (Calculated) : 61.6  Vital Signs: Temp: 97.9 F (36.6 C) (06/03 1015) Temp Source: Oral (06/03 1030) BP: 117/51 mmHg (06/03 1030) Pulse Rate: 91 (06/03 1030)  Labs:  Recent Labs  10/10/14 0420 10/11/14 0435  HGB 10.4* 10.1*  HCT 32.5* 30.8*  PLT 257 240  LABPROT 15.4* 14.4  INR 1.20 1.10  CREATININE 0.65 0.71    Estimated Creatinine Clearance: 89.7 mL/min (by C-G formula based on Cr of 0.71).   Medications:   Scheduled:  . amLODipine  10 mg Oral Daily   And  . irbesartan  300 mg Oral Daily  . atorvastatin  10 mg Oral q morning - 10a  . [COMPLETED] coumadin book   Does not apply Once  . docusate sodium  100 mg Oral BID  . pantoprazole  40 mg Oral q morning - 10a  . warfarin   Does not apply Once  . Warfarin - Pharmacist Dosing Inpatient   Does not apply q1800   Assessment: 76 yoF s/p bilateral TKA 6/1 for end stage OA. Pharmacy consulted to start warfarin with first dose 6/2 @ 10AM . No anticoagulants or antiplatelets PTA. Pt had indwelling epidural in place.   Baseline INR normal  Prior anticoagulation: none  Warfarin nomogram predictor points: 5   Significant events: 6/1 BL TKA 6/3 6579 - epidural catheter removed by anesthesiologist  Today, 10/11/2014:  No INR response yet as expected following reduced warfarin initial dose of 2.5mg  x 1  Major drug interactions: none  No bleeding reported in latest note  Regular diet, charted as eating 100% of most meals   Goal of Therapy: INR 2-3  Plan: 1. Warfarin 7.5 mg PO x 1 today at 1800 as per nomogram. 2. Per ortho, at 10pm begin Lovenox 30 mg SQ q12h and continue until INR 2 or above. 3. PT/INR daily while inpatient.  Clayburn Pert, PharmD, BCPS Pager: [phone] 10/11/2014  11:15  AM

## 2014-10-11 NOTE — Addendum Note (Signed)
Addendum  created 10/11/14 0951 by Montez Hageman, MD   Modules edited: Clinical Notes   Clinical Notes:  File: [id number]

## 2014-10-11 NOTE — Progress Notes (Signed)
Subjective: 2 Days Post-Op Procedure(s) (LRB): BILATERAL TOTAL KNEE ARTHROPLASTY (Bilateral) Patient reports pain as mild and moderate.   Patient seen in rounds by Dr. Wynelle Link. Patient is well, but has had some minor complaints of pain in the knees, requiring pain medications Epidural to come out today.  Anticipate possible increase in pain temporarily after the epidural has been removed. Plan is to go CIR after hospital stay.  Objective: Vital signs in last 24 hours: Temp:  [97.7 F (36.5 C)-99.3 F (37.4 C)] 99.3 F (37.4 C) (06/03 2355) Pulse Rate:  [80-94] 88 (06/03 0613) Resp:  [14-21] 16 (06/03 0613) BP: (118-146)/(51-81) 133/66 mmHg (06/03 0613) SpO2:  [97 %-100 %] 98 % (06/03 0613)  Intake/Output from previous day:  Intake/Output Summary (Last 24 hours) at 10/11/14 0849 Last data filed at 10/11/14 0745  Gross per 24 hour  Intake   3135 ml  Output   5300 ml  Net  -2165 ml    Intake/Output this shift: Total I/O In: 240 [P.O.:240] Out: -   Labs:  Recent Labs  10/10/14 0420 10/11/14 0435  HGB 10.4* 10.1*    Recent Labs  10/10/14 0420 10/11/14 0435  WBC 11.8* 11.5*  RBC 3.65* 3.45*  HCT 32.5* 30.8*  PLT 257 240    Recent Labs  10/10/14 0420 10/11/14 0435  NA 135 138  K 4.4 4.2  CL 102 102  BUN 14 11  CREATININE 0.65 0.71  GLUCOSE 139* 111*  CALCIUM 8.6* 8.8*    Recent Labs  10/10/14 0420 10/11/14 0435  INR 1.20 1.10    EXAM General - Patient is Alert and Appropriate Extremity - Neurovascular intact Sensation intact distally Dorsiflexion/Plantar flexion intact Dressing/Incision - clean, dry, no drainage Motor Function - intact, moving feet and toes well on exam.    Past Medical History  Diagnosis Date  . Hypertension   . Arthritis   . PONV (postoperative nausea and vomiting)   . Frequent urination at night   . GERD (gastroesophageal reflux disease)   . Hyperlipemia     Assessment/Plan: 2 Days Post-Op Procedure(s)  (LRB): BILATERAL TOTAL KNEE ARTHROPLASTY (Bilateral) Principal Problem:   OA (osteoarthritis) of knee  Estimated body mass index is 38.99 kg/(m^2) as calculated from the following:   Height as of this encounter: 5\' 7"  (1.702 m).   Weight as of this encounter: 112.946 kg (249 lb). Up with therapy Continue foley due to urinary output monitoring and she still has her epidural in place.  Will remove the catheter six hours after the epidural is removed.  Will need to note in chart when the epidural is pulled.  DVT Prophylaxis - Lovenox and Coumadin, Lovenox will not start until later this afternoon though after the epidural has been removed for twelve hours.  Will need to note in chart when the epidural is pulled. Anticipate possible increase in pain temporarily after the epidural has been removed.  Weight-Bearing as tolerated to both legs  Take Coumadin for four weeks and then discontinue.  The dose may need to be adjusted based upon the INR.  Please follow the INR and titrate Coumadin dose for a therapeutic range between 2.0 and 3.0 INR.  After completing the four weeks of Coumadin, the patient may stop the Coumadin and resume their 81 mg Aspirin daily.  Lovenox injections will start later this evening after the epidural has been removed and continue until the INR is therapeutic at or greater than 2.0.  When INR reaches the therapeutic level  of equal to or greater than 2.0, the patient may discontinue the Lovenox injections.  [name], PA-C Orthopaedic Surgery 10/11/2014, 8:49 AM

## 2014-10-11 NOTE — Progress Notes (Signed)
Physical Therapy Treatment Patient Details Name: Tara Moreno MRN: [id number] DOB: 1946-04-21 Today's Date: 10/11/2014    History of Present Illness Bil TKR ; s/p left reverse total shoulder ~3 months ago, had right reverse total shoulder in Oct 2015.    PT Comments    PT progressing slowly with mobility - orthostatic - BP to 68/46 with standing.  RN aware,  Follow Up Recommendations  CIR     Equipment Recommendations  None recommended by PT    Recommendations for Other Services OT consult     Precautions / Restrictions Precautions Precautions: Knee;Fall;Other (comment) Precaution Comments: Pt is WBAT on Bil UES and LEs Required Braces or Orthoses: Knee Immobilizer - Right;Knee Immobilizer - Left Knee Immobilizer - Right: Discontinue once straight leg raise with < 10 degree lag Knee Immobilizer - Left: Discontinue once straight leg raise with < 10 degree lag Restrictions Weight Bearing Restrictions: No Other Position/Activity Restrictions: WBAT    Mobility  Bed Mobility Overal bed mobility: Needs Assistance Bed Mobility: Supine to Sit;Sit to Supine     Supine to sit: Max assist;+2 for physical assistance Sit to supine: Max assist;+2 for physical assistance   General bed mobility comments: cues for sequence; pt using UEs minially to self assist  Transfers Overall transfer level: Needs assistance Equipment used: Rolling walker (2 wheeled) Transfers: Sit to/from Stand Sit to Stand: +2 physical assistance;+2 safety/equipment;From elevated surface         General transfer comment: +3 assist.  Utilized bed to stand pt 90% 2* pt  utilizing UEs.minimally  Ambulation/Gait Ambulation/Gait assistance: +2 physical assistance;+2 safety/equipment;Max assist Ambulation Distance (Feet): 0 Feet Assistive device: Rolling walker (2 wheeled)       General Gait Details: Unable to initiate step.  Pt min WB either UE.  Pt stood x 90 sec   Stairs             Wheelchair Mobility    Modified Rankin (Stroke Patients Only)       Balance                                    Cognition Arousal/Alertness: Awake/alert Behavior During Therapy: WFL for tasks assessed/performed Overall Cognitive Status: Within Functional Limits for tasks assessed                      Exercises Total Joint Exercises Ankle Circles/Pumps: AROM;Both;20 reps;Supine Quad Sets: AROM;Both;Supine;20 reps Heel Slides: AAROM;Both;10 reps;Supine Straight Leg Raises: AAROM;Both;Supine;20 reps    General Comments        Pertinent Vitals/Pain Pain Assessment: 0-10 Pain Score: 7  Pain Location: Bil KNees Pain Descriptors / Indicators: Aching;Sore Pain Intervention(s): Limited activity within patient's tolerance;Monitored during session;Premedicated before session;Ice applied    Home Living                      Prior Function            PT Goals (current goals can now be found in the care plan section) Acute Rehab PT Goals Patient Stated Goal: Rehab and then home to walk without pain PT Goal Formulation: With patient Time For Goal Achievement: 10/17/14 Potential to Achieve Goals: Fair Progress towards PT goals: Progressing toward goals    Frequency  7X/week    PT Plan Current plan remains appropriate    Co-evaluation  End of Session Equipment Utilized During Treatment: Right knee immobilizer;Left knee immobilizer Activity Tolerance: Patient limited by fatigue;Other (comment) (Pt orthostatic) Patient left: in bed;with call bell/phone within reach     Time: 4562-5638 PT Time Calculation (min) (ACUTE ONLY): 51 min  Charges:  $Therapeutic Exercise: 8-22 mins $Therapeutic Activity: 23-37 mins                    G Codes:      Tara Moreno 10/12/2014, 9:50 AM

## 2014-10-11 NOTE — Evaluation (Signed)
Occupational Therapy Evaluation Patient Details Name: Tara Moreno MRN: [id number] DOB: [birth year] Today's Date: 10/11/2014    History of Present Illness Bil TKR ; s/p left reverse total shoulder ~3 months ago, had right reverse total shoulder in Oct 2015.   Clinical Impression   This 69 year old female was admitted for the above surgery.  She was limited by decreased BP during OT evaluation.  At baseline, she was mod I with ADLs and she currently needs max A x 2 for sit to stand from elevated surface and up to total A x 2 for LB adls.  Pt will benefit from continued OT to increase safety and independence. Goals in acute are for mod A x1 for LB bathing and hygiene, and mod A x 2 for toilet transfers.  Recommend CIR post acute to further increase safety and independence with ADLs    Follow Up Recommendations  CIR    Equipment Recommendations  3 in 1 bedside comode    Recommendations for Other Services       Precautions / Restrictions Precautions Precautions: Knee;Fall;Other (comment) Precaution Comments: Pt is WBAT on Bil UES and LEs Required Braces or Orthoses: Knee Immobilizer - Right;Knee Immobilizer - Left Knee Immobilizer - Right: Discontinue once straight leg raise with < 10 degree lag Knee Immobilizer - Left: Discontinue once straight leg raise with < 10 degree lag Restrictions Weight Bearing Restrictions: No Other Position/Activity Restrictions: WBAT      Mobility Bed Mobility Overal bed mobility: Needs Assistance Bed Mobility: Supine to Sit;Sit to Supine     Supine to sit: Max assist;+2 for physical assistance (pt 40%) Sit to supine: +2 for physical assistance;Total assist   General bed mobility comments: cues for sequence  Transfers Overall transfer level: Needs assistance Equipment used: Rolling walker (2 wheeled) Transfers: Sit to/from Stand Sit to Stand: Max assist;+2 physical assistance;+2 safety/equipment;From elevated surface         General  transfer comment: pt continues to utlize UEs minimally for sit to stand and while standing.  Platform used on L side    Balance                                            ADL Overall ADL's : Needs assistance/impaired     Grooming: Set up;Sitting   Upper Body Bathing: Set up;Sitting   Lower Body Bathing: Maximal assistance;+2 for physical assistance;Sit to/from stand (for sit to stand; pt can wash 60% of LB)   Upper Body Dressing : Minimal assistance;Sitting (due to lines)   Lower Body Dressing: Total assistance;+2 for physical assistance;Sit to/from stand                 General ADL Comments: unable to perform toilet transfer at this time. Pt unable to step backwards to bed.  Got dizzy/lightheaded in standing.  After standing, sitting BP was 68/46.  when positioned back in supine 95/52. Epidural in.   Educated on [place] only.       Vision     Perception     Praxis      Pertinent Vitals/Pain Pain Assessment: 0-10 Pain Score: 10-Worst pain ever (reports "15" at end of session) Pain Location: bil Knees Pain Descriptors / Indicators: Aching Pain Intervention(s): Limited activity within patient's tolerance;Monitored during session;Premedicated before session;Repositioned;Patient requesting pain meds-RN notified;Ice applied     Hand Dominance  Extremity/Trunk Assessment Upper Extremity Assessment RUE Deficits / Details: able to lift to approximately 110; strength grossly 4-/5 LUE Deficits / Details: able to lift to approximately 90 with 3+/5 strength           Communication Communication Communication: No difficulties   Cognition Arousal/Alertness: Awake/alert Behavior During Therapy: WFL for tasks assessed/performed Overall Cognitive Status: Within Functional Limits for tasks assessed                     General Comments       Exercises      Shoulder Instructions      Home Living Family/patient expects to be discharged  to:: Inpatient rehab                                        Prior Functioning/Environment Level of Independence: Independent;Independent with assistive device(s)             OT Diagnosis: Generalized weakness;Acute pain   OT Problem List: Decreased strength;Decreased activity tolerance;Impaired balance (sitting and/or standing);Decreased knowledge of use of DME or AE;Decreased knowledge of precautions;Cardiopulmonary status limiting activity;Pain   OT Treatment/Interventions: Self-care/ADL training;DME and/or AE instruction;Balance training;Patient/family education;Therapeutic activities    OT Goals(Current goals can be found in the care plan section) Acute Rehab OT Goals Patient Stated Goal: Rehab and then home to walk without pain OT Goal Formulation: With patient Time For Goal Achievement: 10/18/14 Potential to Achieve Goals: Good ADL Goals Pt Will Perform Lower Body Bathing: with mod assist;sit to/from stand;with adaptive equipment Pt Will Transfer to Toilet: with mod assist;with +2 assist;bedside commode;stand pivot transfer Pt Will Perform Toileting - Clothing Manipulation and hygiene: with mod assist;sit to/from stand Additional ADL Goal #1: pt will perform bed mobility with leg lifters with min A in preparation for adls/toilet transfers  OT Frequency: Min 2X/week   Barriers to D/C:            Co-evaluation PT/OT/SLP Co-Evaluation/Treatment: Yes Reason for Co-Treatment: For patient/therapist safety PT goals addressed during session: Mobility/safety with mobility OT goals addressed during session: ADL's and self-care      End of Session Nurse Communication:  (BP)  Activity Tolerance: Treatment limited secondary to medical complications (Comment) (BP) Patient left: in bed;with call bell/phone within reach   Time: 4734-0370 OT Time Calculation (min): 22 min Charges:  OT General Charges $OT Visit: 1 Procedure OT Evaluation $Initial OT Evaluation  Tier I: 1 Procedure G-Codes:    Maranda Marte 11-08-2014, 10:01 AM  Lesle Chris, OTR/L [phone] 08-Nov-2014

## 2014-10-11 NOTE — Discharge Summary (Signed)
Physician Discharge Summary   Patient ID: Tara Moreno MRN: [id number] DOB/AGE: [birth year] 67 y.o.  Admit date: 10/09/2014 Discharge date:  Tentative Date of Discharge - Saturday - 10-12-2014  Primary Diagnosis:  Osteoarthritis Bilateral knee(s)  Admission Diagnoses:  Past Medical History  Diagnosis Date  . Hypertension   . Arthritis   . PONV (postoperative nausea and vomiting)   . Frequent urination at night   . GERD (gastroesophageal reflux disease)   . Hyperlipemia    Discharge Diagnoses:   Principal Problem:   OA (osteoarthritis) of knee  Estimated body mass index is 38.99 kg/(m^2) as calculated from the following:   Height as of this encounter: $RemoveBeforeD'5\' 7"'[id number]$  (1.702 m).   Weight as of this encounter: 112.946 kg (249 lb).  Procedure:  Procedure(s) (LRB): BILATERAL TOTAL KNEE ARTHROPLASTY (Bilateral)   Consults: [facility]  HPI: Tara Moreno is a 69 y.o. year old female with end stage OA of both knees with progressively worsening pain and dysfunction. She has constant pain, with activity and at rest and significant functional deficits with difficulties even with ADLs. She has had extensive non-op management including analgesics, injections of cortisone, and home exercise program, but remains in significant pain with significant dysfunction. We discussed replacing both knees in the same setting versus one at a time including procedure, risks, potential complications, rehab course, and pros and cons associated with each and the patient elects to do both knees at the same time. She presents now for Bilateral Total Knee Arthroplasty.   Laboratory Data: Admission on 10/09/2014  Component Date Value Ref Range Status  . WBC 10/10/2014 11.8* 4.0 - 10.5 K/uL Final  . RBC 10/10/2014 3.65* 3.87 - 5.11 MIL/uL Final  . Hemoglobin 10/10/2014 10.4* 12.0 - 15.0 g/dL Final  . HCT 10/10/2014 32.5* 36.0 - 46.0 % Final  . MCV 10/10/2014 89.0  78.0 - 100.0 fL Final  . MCH 10/10/2014  28.5  26.0 - 34.0 pg Final  . MCHC 10/10/2014 32.0  30.0 - 36.0 g/dL Final  . RDW 10/10/2014 13.4  11.5 - 15.5 % Final  . Platelets 10/10/2014 257  150 - 400 K/uL Final  . Sodium 10/10/2014 135  135 - 145 mmol/L Final  . Potassium 10/10/2014 4.4  3.5 - 5.1 mmol/L Final  . Chloride 10/10/2014 102  101 - 111 mmol/L Final  . CO2 10/10/2014 25  22 - 32 mmol/L Final  . Glucose, Bld 10/10/2014 139* 65 - 99 mg/dL Final  . BUN 10/10/2014 14  6 - 20 mg/dL Final  . Creatinine, Ser 10/10/2014 0.65  0.44 - 1.00 mg/dL Final  . Calcium 10/10/2014 8.6* 8.9 - 10.3 mg/dL Final  . GFR calc non Af Amer 10/10/2014 >60  >60 mL/min Final  . GFR calc Af Amer 10/10/2014 >60  >60 mL/min Final   Comment: (NOTE) The eGFR has been calculated using the CKD EPI equation. This calculation has not been validated in all clinical situations. eGFR's persistently <60 mL/min signify possible Chronic Kidney Disease.   . Anion gap 10/10/2014 8  5 - 15 Final  . Prothrombin Time 10/10/2014 15.4* 11.6 - 15.2 seconds Final  . INR 10/10/2014 1.20  0.00 - 1.49 Final  . WBC 10/11/2014 11.5* 4.0 - 10.5 K/uL Final  . RBC 10/11/2014 3.45* 3.87 - 5.11 MIL/uL Final  . Hemoglobin 10/11/2014 10.1* 12.0 - 15.0 g/dL Final  . HCT 10/11/2014 30.8* 36.0 - 46.0 % Final  . MCV 10/11/2014 89.3  78.0 - 100.0 fL  Final  . MCH 10/11/2014 29.3  26.0 - 34.0 pg Final  . MCHC 10/11/2014 32.8  30.0 - 36.0 g/dL Final  . RDW 10/11/2014 13.7  11.5 - 15.5 % Final  . Platelets 10/11/2014 240  150 - 400 K/uL Final  . Sodium 10/11/2014 138  135 - 145 mmol/L Final  . Potassium 10/11/2014 4.2  3.5 - 5.1 mmol/L Final  . Chloride 10/11/2014 102  101 - 111 mmol/L Final  . CO2 10/11/2014 28  22 - 32 mmol/L Final  . Glucose, Bld 10/11/2014 111* 65 - 99 mg/dL Final  . BUN 10/11/2014 11  6 - 20 mg/dL Final  . Creatinine, Ser 10/11/2014 0.71  0.44 - 1.00 mg/dL Final  . Calcium 10/11/2014 8.8* 8.9 - 10.3 mg/dL Final  . GFR calc non Af Amer 10/11/2014 >60  >60  mL/min Final  . GFR calc Af Amer 10/11/2014 >60  >60 mL/min Final   Comment: (NOTE) The eGFR has been calculated using the CKD EPI equation. This calculation has not been validated in all clinical situations. eGFR's persistently <60 mL/min signify possible Chronic Kidney Disease.   . Anion gap 10/11/2014 8  5 - 15 Final  . Prothrombin Time 10/11/2014 14.4  11.6 - 15.2 seconds Final  . INR 10/11/2014 1.10  0.00 - 1.49 Final  Hospital Outpatient Visit on 10/02/2014  Component Date Value Ref Range Status  . aPTT 10/02/2014 33  24 - 37 seconds Final  . WBC 10/02/2014 7.5  4.0 - 10.5 K/uL Final  . RBC 10/02/2014 4.70  3.87 - 5.11 MIL/uL Final  . Hemoglobin 10/02/2014 13.6  12.0 - 15.0 g/dL Final  . HCT 10/02/2014 42.2  36.0 - 46.0 % Final  . MCV 10/02/2014 89.8  78.0 - 100.0 fL Final  . MCH 10/02/2014 28.9  26.0 - 34.0 pg Final  . MCHC 10/02/2014 32.2  30.0 - 36.0 g/dL Final  . RDW 10/02/2014 13.8  11.5 - 15.5 % Final  . Platelets 10/02/2014 260  150 - 400 K/uL Final  . Sodium 10/02/2014 142  135 - 145 mmol/L Final  . Potassium 10/02/2014 3.6  3.5 - 5.1 mmol/L Final  . Chloride 10/02/2014 102  101 - 111 mmol/L Final  . CO2 10/02/2014 29  22 - 32 mmol/L Final  . Glucose, Bld 10/02/2014 100* 65 - 99 mg/dL Final  . BUN 10/02/2014 23* 6 - 20 mg/dL Final  . Creatinine, Ser 10/02/2014 0.70  0.44 - 1.00 mg/dL Final  . Calcium 10/02/2014 9.5  8.9 - 10.3 mg/dL Final  . Total Protein 10/02/2014 7.8  6.5 - 8.1 g/dL Final  . Albumin 10/02/2014 4.0  3.5 - 5.0 g/dL Final  . AST 10/02/2014 18  15 - 41 U/L Final  . ALT 10/02/2014 14  14 - 54 U/L Final  . Alkaline Phosphatase 10/02/2014 120  38 - 126 U/L Final  . Total Bilirubin 10/02/2014 1.0  0.3 - 1.2 mg/dL Final  . GFR calc non Af Amer 10/02/2014 >60  >60 mL/min Final  . GFR calc Af Amer 10/02/2014 >60  >60 mL/min Final   Comment: (NOTE) The eGFR has been calculated using the CKD EPI equation. This calculation has not been validated in all  clinical situations. eGFR's persistently <60 mL/min signify possible Chronic Kidney Disease.   . Anion gap 10/02/2014 11  5 - 15 Final  . Prothrombin Time 10/02/2014 14.0  11.6 - 15.2 seconds Final  . INR 10/02/2014 1.05  0.00 - 1.49 Final  .  ABO/RH(D) 10/02/2014 O POS   Final  . Antibody Screen 10/02/2014 NEG   Final  . Sample Expiration 10/02/2014 10/12/2014   Final  . Color, Urine 10/02/2014 AMBER* YELLOW Final   BIOCHEMICALS MAY BE AFFECTED BY COLOR  . APPearance 10/02/2014 CLEAR  CLEAR Final  . Specific Gravity, Urine 10/02/2014 1.025  1.005 - 1.030 Final  . pH 10/02/2014 5.5  5.0 - 8.0 Final  . Glucose, UA 10/02/2014 NEGATIVE  NEGATIVE mg/dL Final  . Hgb urine dipstick 10/02/2014 NEGATIVE  NEGATIVE Final  . Bilirubin Urine 10/02/2014 NEGATIVE  NEGATIVE Final  . Ketones, ur 10/02/2014 NEGATIVE  NEGATIVE mg/dL Final  . Protein, ur 10/02/2014 NEGATIVE  NEGATIVE mg/dL Final  . Urobilinogen, UA 10/02/2014 0.2  0.0 - 1.0 mg/dL Final  . Nitrite 10/02/2014 NEGATIVE  NEGATIVE Final  . Leukocytes, UA 10/02/2014 SMALL* NEGATIVE Final  . MRSA, PCR 10/02/2014 NEGATIVE  NEGATIVE Final  . Staphylococcus aureus 10/02/2014 NEGATIVE  NEGATIVE Final   Comment:        The Xpert SA Assay (FDA approved for NASAL specimens in patients over 81 years of age), is one component of a comprehensive surveillance program.  Test performance has been validated by [facility] for patients greater than or equal to 80 year old. It is not intended to diagnose infection nor to guide or monitor treatment.   . ABO/RH(D) 10/02/2014 O POS   Final  . WBC, UA 10/02/2014 0-2  <3 WBC/hpf Final  . Bacteria, UA 10/02/2014 RARE  RARE Final  . Crystals 10/02/2014 CA OXALATE CRYSTALS* NEGATIVE Final  . Urine-Other 10/02/2014 MUCOUS PRESENT   Final     X-Rays:No results found.  EKG: Orders placed or performed during the hospital encounter of 06/21/14  . EKG 12 lead  . EKG 12 lead     Hospital Course: Tara Moreno is a 69 y.o. who was admitted to [facility]. They were brought to the operating room on 10/09/2014 and underwent Procedure(s): BILATERAL TOTAL KNEE ARTHROPLASTY.  Patient tolerated the procedure well and was later transferred to the recovery room and then to the orthopaedic floor for postoperative care.  They were given PO and IV analgesics for pain control following their surgery.  They were given 24 hours of postoperative antibiotics of  Anti-infectives    Start     Dose/Rate Route Frequency Ordered Stop   10/09/14 1700  ceFAZolin (ANCEF) IVPB 2 g/50 mL premix     2 g 100 mL/hr over 30 Minutes Intravenous Every 6 hours 10/09/14 1441 10/09/14 2309   10/09/14 0845  ceFAZolin (ANCEF) IVPB 2 g/50 mL premix     2 g 100 mL/hr over 30 Minutes Intravenous On call to O.R. 10/09/14 4782 10/09/14 1052     and started on DVT prophylaxis in the form of Lovenox and Coumadin.   PT and OT were ordered for total joint protocol.  Discharge planning consulted to help with postop disposition and equipment needs.  CIR consult was called.  An epidural was placed at the time of surgery for postop pain control.  Patient had a tough night on the evening of surgery with pain but improved with PO and IV meds.  Epidural was adjusted by the Anesthesia department  They started to get up OOB with therapy on day one. Hemovac drains were pulled without difficulty.  Continued to work with therapy into day two.  Dressing was changed on day two and the incisions were healing well. Patient was seen in  rounds on day two and was doing well.  It was felt that as long as the patient continued to improve, they would be ready to transfer the following day, Saturday 10/12/2014 to the CIR unit if she was doing well and insurance had approved the stay.  The patient will evaluated by the weekend coverage staff and will discharge if doing well.  This summary was prepared in anticipation of the the patient's transfer over the weekend to  the CIR unit is doing well and if insurance comes through.   Diet: Cardiac diet Activity:WBAT Follow-up:in 2 weeks Disposition - Rehab if approved Discharged Condition: Pending at time of summary, Transfer on Saturday if doing well on weekend rounds.   Discharge Instructions    CPM    Complete by:  As directed   Continuous passive motion machine (CPM):      Use the CPM from Zero to 40 degrees for 4 hours per day per leg.      You may increase by 5-10 degrees per day.  You may break it up into 2 sessions per day.      Use CPM while on the CIR unit.     Call MD / Call 911    Complete by:  As directed   If you experience chest pain or shortness of breath, CALL 911 and be transported to the hospital emergency room.  If you develope a fever above 101 F, pus (white drainage) or increased drainage or redness at the wound, or calf pain, call your surgeon's office.     Change dressing    Complete by:  As directed   Change dressing daily with sterile 4 x 4 inch gauze dressing and apply TED hose. Do not submerge the incision under water.     Constipation Prevention    Complete by:  As directed   Drink plenty of fluids.  Prune juice may be helpful.  You may use a stool softener, such as Colace (over the counter) 100 mg twice a day.  Use MiraLax (over the counter) for constipation as needed.     Diet - low sodium heart healthy    Complete by:  As directed      Discharge instructions    Complete by:  As directed   Pick up stool softner and laxative for home use following surgery while on pain medications. Do not submerge incision under water. Please use good hand washing techniques while changing dressing each day. May shower starting three days after surgery. Please use a clean towel to pat the incision dry following showers. Continue to use ice for pain and swelling after surgery. Do not use any lotions or creams on the incision until instructed by your surgeon.  Take Coumadin for four weeks  and then discontinue.  The dose may need to be adjusted based upon the INR.  Please follow the INR and titrate Coumadin dose for a therapeutic range between 2.0 and 3.0 INR.  After completing the four weeks of Coumadin, the patient may stop the Coumadin and then take an 81 mg Aspirin daily for three more weeks.  Continue Lovenox injections until the INR is therapeutic at or greater than 2.0.  When INR reaches the therapeutic level of equal to or greater than 2.0, the patient may discontinue the Lovenox injections.  Postoperative Constipation Protocol  Constipation - defined medically as fewer than three stools per week and severe constipation as less than one stool per week.  One of the most  common issues patients have following surgery is constipation.  Even if you have a regular bowel pattern at home, your normal regimen is likely to be disrupted due to multiple reasons following surgery.  Combination of anesthesia, postoperative narcotics, change in appetite and fluid intake all can affect your bowels.  In order to avoid complications following surgery, here are some recommendations in order to help you during your recovery period.  Colace (docusate) - Pick up an over-the-counter form of Colace or another stool softener and take twice a day as long as you are requiring postoperative pain medications.  Take with a full glass of water daily.  If you experience loose stools or diarrhea, hold the colace until you stool forms back up.  If your symptoms do not get better within 1 week or if they get worse, check with your doctor.  Dulcolax (bisacodyl) - Pick up over-the-counter and take as directed by the product packaging as needed to assist with the movement of your bowels.  Take with a full glass of water.  Use this product as needed if not relieved by Colace only.   MiraLax (polyethylene glycol) - Pick up over-the-counter to have on hand.  MiraLax is a solution that will increase the amount of water  in your bowels to assist with bowel movements.  Take as directed and can mix with a glass of water, juice, soda, coffee, or tea.  Take if you go more than two days without a movement. Do not use MiraLax more than once per day. Call your doctor if you are still constipated or irregular after using this medication for 7 days in a row.  If you continue to have problems with postoperative constipation, please contact the office for further assistance and recommendations.  If you experience "the worst abdominal pain ever" or develop nausea or vomiting, please contact the office immediatly for further recommendations for treatment.  When discharged from the skilled rehab facility, please have the facility set up the patient's [place] prior to being released.  Please make sure this gets set up prior to release in order to avoid any lapse of therapy following the rehab stay.  Also provide the patient with their medications at time of release from the facility to include their pain medication, the muscle relaxants, and their blood thinner medication.  If the patient is still at the rehab facility at time of follow up appointment, please also assist the patient in arranging follow up appointment in our office and any transportation needs. ICE to the affected knee or hip every three hours for 30 minutes at a time and then as needed for pain and swelling.     Do not put a pillow under the knee. Place it under the heel.    Complete by:  As directed      Do not sit on low chairs, stoools or toilet seats, as it may be difficult to get up from low surfaces    Complete by:  As directed      Driving restrictions    Complete by:  As directed   No driving until released by the physician.     Increase activity slowly as tolerated    Complete by:  As directed      Lifting restrictions    Complete by:  As directed   No lifting until released by the physician.     Patient may shower    Complete by:   As directed  You may shower without a dressing once there is no drainage.  Do not wash over the wound.  If drainage remains, do not shower until drainage stops.     TED hose    Complete by:  As directed   Use stockings (TED hose) for 3 weeks on both leg(s).  You may remove them at night for sleeping.     Weight bearing as tolerated    Complete by:  As directed   Laterality:  bilateral  Extremity:  Lower            Medication List    STOP taking these medications        Fish Oil 1000 MG Caps     GREEN TEA EXTRACT PO     MILK THISTLE PO     multivitamin with minerals Tabs tablet     naproxen sodium 220 MG tablet  Commonly known as:  ANAPROX     oxyCODONE-acetaminophen 5-325 MG per tablet  Commonly known as:  ROXICET     PROBIOTIC DAILY PO     UBIQUINOL PO      TAKE these medications        acetaminophen 325 MG tablet  Commonly known as:  TYLENOL  Take 2 tablets (650 mg total) by mouth every 6 (six) hours as needed for mild pain (or Fever >/= 101).     atorvastatin 10 MG tablet  Commonly known as:  LIPITOR  Take 10 mg by mouth every morning.     AZOR 10-40 MG per tablet  Generic drug:  amLODipine-olmesartan  Take 1 tablet by mouth every morning.     bisacodyl 10 MG suppository  Commonly known as:  DULCOLAX  Place 1 suppository (10 mg total) rectally daily as needed for moderate constipation.     docusate sodium 100 MG capsule  Commonly known as:  COLACE  Take 1 capsule (100 mg total) by mouth 2 (two) times daily.     enoxaparin 30 MG/0.3ML injection  Commonly known as:  LOVENOX  Inject 0.3 mLs (30 mg total) into the skin every 12 (twelve) hours. Continue Lovenox injections until the INR is therapeutic at or greater than 2.0.  When INR reaches the therapeutic level of equal to or greater than 2.0, the patient may discontinue the Lovenox injections.     methocarbamol 500 MG tablet  Commonly known as:  ROBAXIN  Take 1 tablet (500 mg total) by mouth every 6  (six) hours as needed for muscle spasms.     metoCLOPramide 5 MG tablet  Commonly known as:  REGLAN  Take 1 tablet (5 mg total) by mouth every 8 (eight) hours as needed for nausea (if ondansetron (ZOFRAN) ineffective.).     niacin 500 MG tablet  Take 500 mg by mouth every morning.     ondansetron 4 MG tablet  Commonly known as:  ZOFRAN  Take 1 tablet (4 mg total) by mouth every 6 (six) hours as needed for nausea.     oxyCODONE 5 MG immediate release tablet  Commonly known as:  Oxy IR/ROXICODONE  Take 1-4 tablets (5-20 mg total) by mouth every 3 (three) hours as needed for moderate pain, severe pain or breakthrough pain.     pantoprazole 40 MG tablet  Commonly known as:  PROTONIX  Take 40 mg by mouth every morning.     polyethylene glycol packet  Commonly known as:  MIRALAX / GLYCOLAX  Take 17 g by mouth daily as needed for mild constipation.  RESTASIS OP  Place 1 drop into both eyes daily as needed (dry eyes).     traMADol 50 MG tablet  Commonly known as:  ULTRAM  Take 1-2 tablets (50-100 mg total) by mouth every 6 (six) hours as needed (mild to moderate pain).     warfarin 5 MG tablet  Commonly known as:  COUMADIN  Take 1 tablet (5 mg total) by mouth daily. Take Coumadin for four weeks and then discontinue.  The dose may need to be adjusted based upon the INR.  Please follow the INR and titrate Coumadin dose for a therapeutic range between 2.0 and 3.0 INR.  After completing the four weeks of Coumadin, the patient may stop the Coumadin and then take an 81 mg Aspirin daily for three more weeks.           Follow-up Information    Follow up with [name], MD. Schedule an appointment as soon as possible for a visit on 10/22/2014.   Specialty:  Orthopedic Surgery   Why:  Call office at [phone] to setup appointment on Tuesday 10/22/2014 with Dr. Wynelle Link or following discharge for the rehab center.   Contact information:   [address] [place]  [address] [phone]       Signed: [name], PA-C Orthopaedic Surgery 10/11/2014, 9:21 AM

## 2014-10-11 NOTE — Progress Notes (Signed)
Pt doing well s/p bilat TKR. Epidural has worked well. Moving both legs and feet.  Epidural removed, tip intact. Site is clean and dry.  [employer]

## 2014-10-12 LAB — CBC
HEMATOCRIT: 27.7 % — AB (ref 36.0–46.0)
Hemoglobin: 8.9 g/dL — ABNORMAL LOW (ref 12.0–15.0)
MCH: 28.2 pg (ref 26.0–34.0)
MCHC: 32.1 g/dL (ref 30.0–36.0)
MCV: 87.7 fL (ref 78.0–100.0)
Platelets: 221 10*3/uL (ref 150–400)
RBC: 3.16 MIL/uL — AB (ref 3.87–5.11)
RDW: 13.8 % (ref 11.5–15.5)
WBC: 12.4 10*3/uL — AB (ref 4.0–10.5)

## 2014-10-12 LAB — PROTIME-INR
INR: 1.35 (ref 0.00–1.49)
PROTHROMBIN TIME: 16.8 s — AB (ref 11.6–15.2)

## 2014-10-12 MED ORDER — WARFARIN SODIUM 7.5 MG PO TABS
7.5000 mg | ORAL_TABLET | Freq: Once | ORAL | Status: AC
  Administered 2014-10-12: 7.5 mg via ORAL
  Filled 2014-10-12: qty 1

## 2014-10-12 NOTE — Care Management Note (Signed)
Case Management Note  Patient Details  Name: Tara Moreno MRN: [id number] Date of Birth: 1945-10-09  Subjective/Objective: Spoke to inpt rehab coordin(Genie) she has not heard back from  [employer] @ [facility].  Await PT to see patient today for update on status.  Will start back up plan since once medically stable will have alternative plans in place.CSW notified-who has already faxed patient out.CM will provide [facility] agency list if d/c plan is for home.  Nsg updated.                Action/Plan:Monitor progress for d/c plans.   Expected Discharge Date:  10/12/14               Expected Discharge Plan:  [place] (IP rehab coordin.has not heard back from insurance company @ all.If medically stable will plan for d/c-either SNF or home. w/HH. Await PT to see patient today. )  In-House Referral:  Clinical Social Work  Discharge planning Services  CM Consult  Post Acute Care Choice:    Choice offered to:     DME Arranged:    DME Agency:     HH Arranged:    HH Agency:     Status of Service:  In process, will continue to follow  Medicare Important Message Given:  Yes Date Medicare IM Given:  10/12/14 Medicare IM give by:  Dessa Phi Date Additional Medicare IM Given:    Additional Medicare Important Message give by:     If discussed at [place] of Stay Meetings, dates discussed:    Additional Comments:  Dessa Phi, RN 10/12/2014, 12:58 PM

## 2014-10-12 NOTE — Progress Notes (Signed)
   Subjective: 3 Days Post-Op Procedure(s) (LRB): BILATERAL TOTAL KNEE ARTHROPLASTY (Bilateral)  Pt rested well yesterday Moderate pain remains in bilateral knees Plan for CIR vs SNF placement Patient reports pain as moderate.  Objective:   VITALS:   Filed Vitals:   10/12/14 0424  BP: 128/65  Pulse: 93  Temp: 98.4 F (36.9 C)  Resp: 16    Bilateral knee incisions healing well Dressings changed nv intact distally No rashes or drainage  LABS  Recent Labs  10/10/14 0420 10/11/14 0435 10/12/14 0500  HGB 10.4* 10.1* 8.9*  HCT 32.5* 30.8* 27.7*  WBC 11.8* 11.5* 12.4*  PLT 257 240 221     Recent Labs  10/10/14 0420 10/11/14 0435  NA 135 138  K 4.4 4.2  BUN 14 11  CREATININE 0.65 0.71  GLUCOSE 139* 111*     Assessment/Plan: 3 Days Post-Op Procedure(s) (LRB): BILATERAL TOTAL KNEE ARTHROPLASTY (Bilateral) Continue PT/OT Pain management D/c planning    Merla Riches, MPAS, PA-C  10/12/2014, 7:36 AM

## 2014-10-12 NOTE — Progress Notes (Signed)
Physical Therapy Treatment Patient Details Name: Tara Moreno MRN: [id number] DOB: Jan 25, 1946 Today's Date: 10/12/2014    History of Present Illness Bil TKR ; s/p left reverse total shoulder ~3 months ago, had right reverse total shoulder in Oct 2015.    PT Comments    Marked improvement in activity tolerance vs yesterday.  Pt tolerating increased UE WB and able to initiate step to walk short distance  Follow Up Recommendations  CIR     Equipment Recommendations  None recommended by PT    Recommendations for Other Services OT consult     Precautions / Restrictions Precautions Precautions: Knee;Fall;Other (comment) Precaution Comments: Pt is WBAT on Bil UES and LEs Required Braces or Orthoses: Knee Immobilizer - Right;Knee Immobilizer - Left Knee Immobilizer - Right: Discontinue once straight leg raise with < 10 degree lag Knee Immobilizer - Left: Discontinue once straight leg raise with < 10 degree lag Restrictions Weight Bearing Restrictions: No Other Position/Activity Restrictions: WBAT    Mobility  Bed Mobility Overal bed mobility: Needs Assistance Bed Mobility: Supine to Sit;Sit to Supine     Supine to sit: Mod assist;+2 for physical assistance Sit to supine: Mod assist;+2 for physical assistance   General bed mobility comments: cues for sequence  Transfers Overall transfer level: Needs assistance Equipment used: Rolling walker (2 wheeled) Transfers: Sit to/from Stand Sit to Stand: Mod assist;Max assist;+2 physical assistance;From elevated surface         General transfer comment: cues for LE management and use of UEs to self assist  Ambulation/Gait Ambulation/Gait assistance: Mod assist;+2 physical assistance Ambulation Distance (Feet): 3 Feet (twice) Assistive device: Rolling walker (2 wheeled) Gait Pattern/deviations: Step-to pattern;Decreased step length - right;Decreased step length - left;Shuffle;Trunk flexed Gait velocity: decr   General  Gait Details: Cues for posture, position from RW, increased UE WB and sequence   Stairs            Wheelchair Mobility    Modified Rankin (Stroke Patients Only)       Balance                                    Cognition Arousal/Alertness: Awake/alert Behavior During Therapy: WFL for tasks assessed/performed Overall Cognitive Status: Within Functional Limits for tasks assessed                      Exercises Total Joint Exercises Ankle Circles/Pumps: AROM;Both;20 reps;Supine Quad Sets: AROM;Both;Supine;20 reps Heel Slides: AAROM;Both;Supine;20 reps Straight Leg Raises: AAROM;Both;Supine;20 reps    General Comments        Pertinent Vitals/Pain Pain Assessment: 0-10 Pain Score: 5  Pain Location: Bil knees Pain Descriptors / Indicators: Aching;Sore Pain Intervention(s): Limited activity within patient's tolerance;Monitored during session;Premedicated before session;Ice applied    Home Living                      Prior Function            PT Goals (current goals can now be found in the care plan section) Acute Rehab PT Goals Patient Stated Goal: Rehab and then home to walk without pain PT Goal Formulation: With patient Time For Goal Achievement: 10/17/14 Potential to Achieve Goals: Fair Progress towards PT goals: Progressing toward goals    Frequency  7X/week    PT Plan Current plan remains appropriate    Co-evaluation  End of Session Equipment Utilized During Treatment: Right knee immobilizer;Left knee immobilizer Activity Tolerance: Patient tolerated treatment well Patient left: in bed;with call bell/phone within reach     Time: 1018-1120 PT Time Calculation (min) (ACUTE ONLY): 62 min  Charges:  $Gait Training: 8-22 mins $Therapeutic Exercise: 23-37 mins $Therapeutic Activity: 8-22 mins                    G Codes:      Gerard Cantara 11-03-2014, 2:52 PM

## 2014-10-12 NOTE — Progress Notes (Signed)
Physical Therapy Treatment Patient Details Name: Tara Moreno MRN: [id number] DOB: 11/13/1945 Today's Date: 10/12/2014    History of Present Illness Bil TKR ; s/p left reverse total shoulder ~3 months ago, had right reverse total shoulder in Oct 2015.    PT Comments      Follow Up Recommendations  CIR     Equipment Recommendations  None recommended by PT    Recommendations for Other Services OT consult     Precautions / Restrictions Precautions Precautions: Knee;Fall;Other (comment) Precaution Comments: Pt is WBAT on Bil UES and LEs Required Braces or Orthoses: Knee Immobilizer - Right;Knee Immobilizer - Left Knee Immobilizer - Right: Discontinue once straight leg raise with < 10 degree lag Knee Immobilizer - Left: Discontinue once straight leg raise with < 10 degree lag Restrictions Weight Bearing Restrictions: No Other Position/Activity Restrictions: WBAT    Mobility  Bed Mobility Overal bed mobility: Needs Assistance Bed Mobility: Supine to Sit;Sit to Supine     Supine to sit: Mod assist;+2 for physical assistance Sit to supine: Mod assist;+2 for physical assistance   General bed mobility comments: cues for sequence  Transfers Overall transfer level: Needs assistance Equipment used: Rolling walker (2 wheeled) Transfers: Sit to/from Stand Sit to Stand: Mod assist;Max assist;+2 physical assistance;From elevated surface         General transfer comment: cues for LE management and use of UEs to self assist  Ambulation/Gait Ambulation/Gait assistance: Mod assist;+2 physical assistance Ambulation Distance (Feet): 3 Feet (twice) Assistive device: Rolling walker (2 wheeled) Gait Pattern/deviations: Step-to pattern;Decreased step length - right;Decreased step length - left;Shuffle;Trunk flexed Gait velocity: decr   General Gait Details: Cues for posture, position from RW, increased UE WB and sequence   Stairs            Wheelchair Mobility     Modified Rankin (Stroke Patients Only)       Balance                                    Cognition Arousal/Alertness: Awake/alert Behavior During Therapy: WFL for tasks assessed/performed Overall Cognitive Status: Within Functional Limits for tasks assessed                      Exercises Total Joint Exercises Ankle Circles/Pumps: AROM;Both;20 reps;Supine Quad Sets: AROM;Both;Supine;20 reps Heel Slides: AAROM;Both;Supine;10 reps Straight Leg Raises: AAROM;Both;Supine;20 reps    General Comments        Pertinent Vitals/Pain Pain Assessment: 0-10 Pain Score: 4  Pain Location: Bil knees Pain Descriptors / Indicators: Aching;Sore Pain Intervention(s): Limited activity within patient's tolerance;Monitored during session;Premedicated before session;Ice applied    Home Living                      Prior Function            PT Goals (current goals can now be found in the care plan section) Acute Rehab PT Goals Patient Stated Goal: Rehab and then home to walk without pain PT Goal Formulation: With patient Time For Goal Achievement: 10/17/14 Potential to Achieve Goals: Fair Progress towards PT goals: Progressing toward goals    Frequency  7X/week    PT Plan Current plan remains appropriate    Co-evaluation             End of Session Equipment Utilized During Treatment: Right knee immobilizer;Left knee immobilizer Activity Tolerance: Patient  tolerated treatment well Patient left: in bed;with call bell/phone within reach     Time: 1415-1445 PT Time Calculation (min) (ACUTE ONLY): 30 min  Charges:  $Gait Training: 8-22 mins $Therapeutic Exercise: 23-37 mins $Therapeutic Activity: 8-22 mins                    G Codes:      Tara Moreno 10-23-2014, 2:56 PM

## 2014-10-12 NOTE — Progress Notes (Signed)
ANTICOAGULATION CONSULT NOTE - Follow Up Consult  Pharmacy Consult for Warfarin Indication: VTE prophylaxis   No Known Allergies  Patient Measurements: Height: 5\' 7"  (170.2 cm) Weight: 249 lb (112.946 kg) IBW/kg (Calculated) : 61.6  Vital Signs: Temp: 98.8 F (37.1 C) (06/04 1018) Temp Source: Oral (06/04 1018) BP: 115/53 mmHg (06/04 1018) Pulse Rate: 94 (06/04 1018)  Labs:  Recent Labs  10/10/14 0420 10/11/14 0435 10/12/14 0500  HGB 10.4* 10.1* 8.9*  HCT 32.5* 30.8* 27.7*  PLT 257 240 221  LABPROT 15.4* 14.4 16.8*  INR 1.20 1.10 1.35  CREATININE 0.65 0.71  --     Estimated Creatinine Clearance: 89.7 mL/min (by C-G formula based on Cr of 0.71).   Medications:   Scheduled:  . amLODipine  10 mg Oral Daily   And  . irbesartan  300 mg Oral Daily  . atorvastatin  10 mg Oral q morning - 10a  . docusate sodium  100 mg Oral BID  . enoxaparin (LOVENOX) injection  30 mg Subcutaneous Q12H  . pantoprazole  40 mg Oral q morning - 10a  . Warfarin - Pharmacist Dosing Inpatient   Does not apply q1800   Assessment: 36 yoF s/p bilateral TKA 6/1 for end stage OA. Pharmacy consulted to start warfarin with first dose 6/2 @ 10AM . No anticoagulants or antiplatelets PTA. Pt had indwelling epidural in place.   Baseline INR normal  Prior anticoagulation: none  Warfarin nomogram predictor points: 5   Significant events: 6/1 BL TKA 6/3 0454 - epidural catheter removed by anesthesiologist, Warfarin education completed 6/2  Today, 10/12/2014:  Slight INR response as expected   Major drug interactions: none  No bleeding reported in latest note  Regular diet, charted as eating 100% of most meals   Goal of Therapy: INR 2-3  Plan: 1. Warfarin 7.5 mg PO x 1 today at 1800 as per nomogram. 2. 6/3 begin Lovenox 30 mg SQ q12h per ortho and continue until INR 2 or above. 3. PT/INR daily while inpatient.  Minda Ditto PharmD Pager [phone] 10/12/2014, 10:35  AM

## 2014-10-13 LAB — PROTIME-INR
INR: 1.47 (ref 0.00–1.49)
PROTHROMBIN TIME: 17.9 s — AB (ref 11.6–15.2)

## 2014-10-13 MED ORDER — WARFARIN SODIUM 7.5 MG PO TABS
7.5000 mg | ORAL_TABLET | Freq: Once | ORAL | Status: AC
  Administered 2014-10-13: 7.5 mg via ORAL
  Filled 2014-10-13: qty 1

## 2014-10-13 NOTE — Plan of Care (Signed)
Problem: Phase II Progression Outcomes Goal: Ambulates Outcome: Progressing 3 feet

## 2014-10-13 NOTE — Progress Notes (Signed)
Physical Therapy Treatment Patient Details Name: Tara Moreno MRN: [id number] DOB: 11-16-1945 Today's Date: 10/13/2014    History of Present Illness Bil TKR ; s/p left reverse total shoulder ~3 months ago, had right reverse total shoulder in Oct 2015.    PT Comments    Progressing with mobility. Pain rated 5/10 during session. Pt slowly increasing mobility and activity tolerance. Continue to recommend CIR.   Follow Up Recommendations  CIR     Equipment Recommendations  None recommended by PT    Recommendations for Other Services OT consult     Precautions / Restrictions Precautions Precautions: Fall;Knee Precaution Comments: Pt is WBAT on Bil UES and LEs Required Braces or Orthoses: Knee Immobilizer - Right;Knee Immobilizer - Left Knee Immobilizer - Right: Discontinue once straight leg raise with < 10 degree lag Knee Immobilizer - Left: Discontinue once straight leg raise with < 10 degree lag    Mobility  Bed Mobility Overal bed mobility: Needs Assistance Bed Mobility: Supine to Sit;Sit to Supine     Supine to sit: Mod assist;+2 for physical assistance;+2 for safety/equipment;HOB elevated Sit to supine: Mod assist;+2 for physical assistance;+2 for safety/equipment;HOB elevated   General bed mobility comments: Assist for trunk and bil LEs.Increased time. VCs technique.   Transfers Overall transfer level: Needs assistance Equipment used: Rolling walker (2 wheeled) Transfers: Sit to/from [facility] Sit to Stand: Mod assist;+2 physical assistance;+2 safety/equipment;From elevated surface Stand pivot transfers: Mod assist;+2 physical assistance;+2 safety/equipment;From elevated surface       General transfer comment: HIGHLY elevated surfaces. Assist to rise, stabilize, control descent, maneuver/position bil LEs. Stand pivot x 2, bed<>bsc with RW  Ambulation/Gait Ambulation/Gait assistance: Mod assist;+2 physical assistance;+2  safety/equipment Ambulation Distance (Feet): 3 Feet Assistive device: Rolling walker (2 wheeled) Gait Pattern/deviations: Antalgic;Trunk flexed;Wide base of support;Decreased step length - right;Decreased step length - left;Step-to pattern     General Gait Details: Assist to maneuver RW, support/stabilize pt. Increased time. VCs safety, technique.    Stairs            Wheelchair Mobility    Modified Rankin (Stroke Patients Only)       Balance                                    Cognition Arousal/Alertness: Awake/alert Behavior During Therapy: WFL for tasks assessed/performed Overall Cognitive Status: Within Functional Limits for tasks assessed                      Exercises Total Joint Exercises Ankle Circles/Pumps: AROM;Both;Supine;10 reps Quad Sets: AROM;Both;Supine;10 reps Heel Slides: AAROM;Both;Supine;10 reps Hip ABduction/ADduction: AAROM;Both;10 reps;Supine Straight Leg Raises: AAROM;Both;Supine;10 reps Goniometric ROM: ~10-45 degrees    General Comments        Pertinent Vitals/Pain Pain Assessment: 0-10 Pain Score: 5  Pain Location: bil knees Pain Descriptors / Indicators: Aching;Sore Pain Intervention(s): RN gave pain meds during session;Repositioned;Ice applied;Limited activity within patient's tolerance    Home Living                      Prior Function            PT Goals (current goals can now be found in the care plan section) Progress towards PT goals: Progressing toward goals    Frequency  7X/week    PT Plan Current plan remains appropriate    Co-evaluation  End of Session Equipment Utilized During Treatment: Right knee immobilizer;Left knee immobilizer Activity Tolerance: Patient tolerated treatment well Patient left: in bed;with call bell/phone within reach     Time: 1030-1117 PT Time Calculation (min) (ACUTE ONLY): 47 min  Charges:  $Therapeutic Exercise: 8-22  mins $Therapeutic Activity: 23-37 mins                    G Codes:      Weston Anna, MPT Pager: [phone]

## 2014-10-13 NOTE — Progress Notes (Signed)
ANTICOAGULATION CONSULT NOTE - Follow Up Consult  Pharmacy Consult for Warfarin Indication: VTE prophylaxis   No Known Allergies  Patient Measurements: Height: 5\' 7"  (170.2 cm) Weight: 249 lb (112.946 kg) IBW/kg (Calculated) : 61.6  Vital Signs: Temp: 98.9 F (37.2 C) (06/05 0639) Temp Source: Oral (06/05 0639) BP: 115/51 mmHg (06/05 0530) Pulse Rate: 93 (06/05 0530)  Labs:  Recent Labs  10/11/14 0435 10/12/14 0500 10/13/14 0551  HGB 10.1* 8.9*  --   HCT 30.8* 27.7*  --   PLT 240 221  --   LABPROT 14.4 16.8* 17.9*  INR 1.10 1.35 1.47  CREATININE 0.71  --   --    Estimated Creatinine Clearance: 89.7 mL/min (by C-G formula based on Cr of 0.71).  Medications:   Scheduled:  . amLODipine  10 mg Oral Daily   And  . irbesartan  300 mg Oral Daily  . atorvastatin  10 mg Oral q morning - 10a  . docusate sodium  100 mg Oral BID  . enoxaparin (LOVENOX) injection  30 mg Subcutaneous Q12H  . pantoprazole  40 mg Oral q morning - 10a  . Warfarin - Pharmacist Dosing Inpatient   Does not apply q1800   Assessment: 82 yoF s/p bilateral TKA 6/1 for end stage OA. Pharmacy consulted to start warfarin with first dose 6/2 @ 10AM . No anticoagulants or antiplatelets PTA. Pt had indwelling epidural in place.   Baseline INR normal  Prior anticoagulation: none  Warfarin nomogram predictor points: 5   Significant events: 6/1 BL TKA 6/3 3734 - epidural catheter removed by anesthesiologist, Warfarin education completed 6/2  Today, 10/13/2014:  Slow INR response as expected   Major drug interactions: none  No bleeding reported in latest note  Regular diet, charted as eating 100% of most meals, decreased intake last night.  Goal of Therapy: INR 2-3  Plan: 1. Warfarin 7.5 mg PO x 1 today at 1200  2. 6/3 started Lovenox 30 mg SQ q12h per ortho and continue until INR 2 or above. 3. PT/INR daily while inpatient.  Minda Ditto PharmD Pager [phone] 10/13/2014, 10:22  AM

## 2014-10-13 NOTE — Progress Notes (Signed)
Subjective: 4 Days Post-Op Procedure(s) (LRB): BILATERAL TOTAL KNEE ARTHROPLASTY (Bilateral) Patient reports pain as mild.  Tolerating PO's. Reports a good night. Denies CP,SOB, or calf pain.  Objective: Vital signs in last 24 hours: Temp:  [98.8 F (37.1 C)-100 F (37.8 C)] 98.9 F (37.2 C) (06/05 0639) Pulse Rate:  [93-96] 93 (06/05 0530) Resp:  [16-20] 20 (06/05 0530) BP: (115-121)/(51-53) 115/51 mmHg (06/05 0530) SpO2:  [96 %-100 %] 100 % (06/05 0530)  Intake/Output from previous day: 06/04 0701 - 06/05 0700 In: 1225 [P.O.:600; I.V.:625] Out: 400 [Urine:400] Intake/Output this shift:     Recent Labs  10/11/14 0435 10/12/14 0500  HGB 10.1* 8.9*    Recent Labs  10/11/14 0435 10/12/14 0500  WBC 11.5* 12.4*  RBC 3.45* 3.16*  HCT 30.8* 27.7*  PLT 240 221    Recent Labs  10/11/14 0435  NA 138  K 4.2  CL 102  CO2 28  BUN 11  CREATININE 0.71  GLUCOSE 111*  CALCIUM 8.8*    Recent Labs  10/12/14 0500 10/13/14 0551  INR 1.35 1.47    Well nourished. Alert and oriented x3. RRR, Lungs clear, BS x4. Abdomen soft and non tender. Right Calf soft and non tender. Right knee dressing Changed. No DVT signs. Compartment soft. No signs of infection.  Right LE grossly neurovascular intact.   Left Calf soft and non tender. L knee dressing Changed. No DVT signs. No signs of infection or compartment syndrome. LLE grossly neurovascularly intact.   Assessment/Plan: 4 Days Post-Op Procedure(s) (LRB): BILATERAL TOTAL KNEE ARTHROPLASTY (Bilateral) Dressing changed Check CBC in am Continue PT Plan for CIR tomorrow Continue current care  Tara Moreno L 10/13/2014, 9:03 AM

## 2014-10-14 LAB — CBC
HCT: 26.5 % — ABNORMAL LOW (ref 36.0–46.0)
HEMOGLOBIN: 8.5 g/dL — AB (ref 12.0–15.0)
MCH: 28.1 pg (ref 26.0–34.0)
MCHC: 32.1 g/dL (ref 30.0–36.0)
MCV: 87.5 fL (ref 78.0–100.0)
Platelets: 257 10*3/uL (ref 150–400)
RBC: 3.03 MIL/uL — AB (ref 3.87–5.11)
RDW: 13.9 % (ref 11.5–15.5)
WBC: 10.4 10*3/uL (ref 4.0–10.5)

## 2014-10-14 LAB — PROTIME-INR
INR: 1.71 — ABNORMAL HIGH (ref 0.00–1.49)
PROTHROMBIN TIME: 20.1 s — AB (ref 11.6–15.2)

## 2014-10-14 MED ORDER — WARFARIN SODIUM 5 MG PO TABS
5.0000 mg | ORAL_TABLET | Freq: Once | ORAL | Status: AC
  Administered 2014-10-14: 5 mg via ORAL
  Filled 2014-10-14 (×2): qty 1

## 2014-10-14 NOTE — Progress Notes (Signed)
Physical Therapy Treatment Patient Details Name: Tara Moreno MRN: [id number] DOB: [birth year] Today's Date: 10/14/2014    History of Present Illness Bil TKR ; s/p left reverse total shoulder ~3 months ago, had right reverse total shoulder in Oct 2015.    PT Comments    Pt in good spirits and progressing with mobility including ambulating increased distance and sans R KI.  Follow Up Recommendations  CIR     Equipment Recommendations  None recommended by PT    Recommendations for Other Services OT consult     Precautions / Restrictions Precautions Precautions: Fall;Knee Required Braces or Orthoses: Knee Immobilizer - Right;Knee Immobilizer - Left Knee Immobilizer - Right: Discontinue once straight leg raise with < 10 degree lag (Pt performed IND SLR) Knee Immobilizer - Left: Discontinue once straight leg raise with < 10 degree lag Restrictions Weight Bearing Restrictions: No Other Position/Activity Restrictions: WBAT    Mobility  Bed Mobility Overal bed mobility: Needs Assistance Bed Mobility: Supine to Sit;Sit to Supine     Supine to sit: Mod assist Sit to supine: Mod assist   General bed mobility comments: Assist with Bil LEs  Transfers Overall transfer level: Needs assistance Equipment used: Rolling walker (2 wheeled) Transfers: Sit to/from Stand Sit to Stand: Mod assist;+2 physical assistance;+2 safety/equipment;From elevated surface         General transfer comment: HIGHLY elevated surfaces. Assist to rise, stabilize, control descent, maneuver/position bil LEs.   Ambulation/Gait Ambulation/Gait assistance: Mod assist;+2 physical assistance;+2 safety/equipment Ambulation Distance (Feet): 9 Feet (twice) Assistive device: Rolling walker (2 wheeled) Gait Pattern/deviations: Step-to pattern;Decreased step length - right;Decreased step length - left;Shuffle;Trunk flexed Gait velocity: decr   General Gait Details: cues for sequence, posture and position  from RW.  Assist for balance, support and RW management   Stairs            Wheelchair Mobility    Modified Rankin (Stroke Patients Only)       Balance                                    Cognition Arousal/Alertness: Awake/alert Behavior During Therapy: WFL for tasks assessed/performed Overall Cognitive Status: Within Functional Limits for tasks assessed                      Exercises Total Joint Exercises Ankle Circles/Pumps: AROM;Both;Supine;20 reps Quad Sets: AROM;Both;Supine;20 reps Heel Slides: AAROM;Both;Supine;20 reps Straight Leg Raises: Both;Supine;20 reps;AAROM;AROM Goniometric ROM: Bil knees ~-10 - 55    General Comments        Pertinent Vitals/Pain Pain Assessment: 0-10 Pain Score: 4  Pain Location: Bil knees Pain Descriptors / Indicators: Aching;Sore Pain Intervention(s): Limited activity within patient's tolerance;Monitored during session;Premedicated before session;Ice applied    Home Living                      Prior Function            PT Goals (current goals can now be found in the care plan section) Acute Rehab PT Goals Patient Stated Goal: Rehab and then home to walk without pain PT Goal Formulation: With patient Time For Goal Achievement: 10/17/14 Potential to Achieve Goals: Fair Progress towards PT goals: Progressing toward goals    Frequency  7X/week    PT Plan Current plan remains appropriate    Co-evaluation  End of Session Equipment Utilized During Treatment: Gait belt;Left knee immobilizer Activity Tolerance: Patient tolerated treatment well Patient left: in bed;with call bell/phone within reach     Time: 0830-0940 PT Time Calculation (min) (ACUTE ONLY): 70 min  Charges:  $Gait Training: 23-37 mins $Therapeutic Exercise: 23-37 mins                    G Codes:      Lorelie Biermann 2014/10/31, 10:54 AM

## 2014-10-14 NOTE — Progress Notes (Signed)
ANTICOAGULATION CONSULT NOTE - Follow Up Consult  Pharmacy Consult for Warfarin Indication: VTE prophylaxis   No Known Allergies  Patient Measurements: Height: 5\' 7"  (170.2 cm) Weight: 249 lb (112.946 kg) IBW/kg (Calculated) : 61.6  Vital Signs: Temp: 99.3 F (37.4 C) (06/06 0528) Temp Source: Oral (06/06 0528) BP: 120/73 mmHg (06/06 0528) Pulse Rate: 93 (06/06 0528)  Labs:  Recent Labs  10/12/14 0500 10/13/14 0551 10/14/14 0515  HGB 8.9*  --  8.5*  HCT 27.7*  --  26.5*  PLT 221  --  257  LABPROT 16.8* 17.9* 20.1*  INR 1.35 1.47 1.71*   Estimated Creatinine Clearance: 89.7 mL/min (by C-G formula based on Cr of 0.71).  Medications:   Scheduled:  . amLODipine  10 mg Oral Daily   And  . irbesartan  300 mg Oral Daily  . atorvastatin  10 mg Oral q morning - 10a  . docusate sodium  100 mg Oral BID  . enoxaparin (LOVENOX) injection  30 mg Subcutaneous Q12H  . pantoprazole  40 mg Oral q morning - 10a  . warfarin  5 mg Oral ONCE-1800  . Warfarin - Pharmacist Dosing Inpatient   Does not apply q1800   See MAR for inpatient warfarin doses administered   Assessment: 3 yoF s/p bilateral TKA 6/1 for end stage OA. Pharmacy consulted to start warfarin with first dose 6/2 @ 10AM . No anticoagulants or antiplatelets PTA. Pt had indwelling epidural in place.   Baseline INR normal  Prior anticoagulation: none  Warfarin nomogram predictor points: 5   Significant events: 6/1 BL TKA 6/3 0948 - epidural catheter removed by anesthesiologist, Warfarin education completed 6/2  Today, 10/14/2014:  INR responding   Major drug interactions: none  No bleeding reported in latest note  Regular diet, charted as eating 100% of most meals.  Hoping for discharge to CIR today but uncertain whether insurance will give clearance soon enough to allow this.  Goal of Therapy: INR 2-3  Plan: 1. Warfarin 5 mg PO x 1 today at 1800 2. Lovenox 30 mg SQ q12h until INR 2.0 or above (as  ordered by ortho) 3. PT/INR daily while inpatient.  Clayburn Pert, PharmD, BCPS Pager: [phone] 10/14/2014  2:57 PM

## 2014-10-14 NOTE — Progress Notes (Signed)
Rehab admissions - I met with patient this am.  I explained that I have not heard back from insurance carrier.  I have faxed additional information to insurance carrier requesting acute inpatient rehab admission for today.  I hope to hear back later today.  Call me for questions.  #[phone]

## 2014-10-14 NOTE — Discharge Summary (Addendum)
ADDENDUM - Physician Discharge Summary   Patient ID: Tara Moreno MRN: [id number] DOB/AGE: August 07, 1945 69 y.o.  Admit date: 10/09/2014 Discharge date: Addendum Date of Discharge - 10/15/2014  Primary Diagnosis:  Osteoarthritis Bilateral knee(s)  Admission Diagnoses:  Past Medical History  Diagnosis Date  . Hypertension   . Arthritis   . PONV (postoperative nausea and vomiting)   . Frequent urination at night   . GERD (gastroesophageal reflux disease)   . Hyperlipemia    Discharge Diagnoses:   Principal Problem:   OA (osteoarthritis) of knee  Estimated body mass index is 38.99 kg/(m^2) as calculated from the following:   Height as of this encounter: _0  (1.702 m).   Weight as of this encounter: 112.946 kg (249 lb).  Procedure:  Procedure(s) (LRB): BILATERAL TOTAL KNEE ARTHROPLASTY (Bilateral)   Consults: [facility]  HPI: Tara Moreno is a 69 y.o. year old female with end stage OA of both knees with progressively worsening pain and dysfunction. She has constant pain, with activity and at rest and significant functional deficits with difficulties even with ADLs. She has had extensive non-op management including analgesics, injections of cortisone, and home exercise program, but remains in significant pain with significant dysfunction. We discussed replacing both knees in the same setting versus one at a time including procedure, risks, potential complications, rehab course, and pros and cons associated with each and the patient elects to do both knees at the same time. She presents now for Bilateral Total Knee Arthroplasty.   Laboratory Data: Admission on 10/09/2014  Component Date Value Ref Range Status  . WBC 10/10/2014 11.8* 4.0 - 10.5 K/uL Final  . RBC 10/10/2014 3.65* 3.87 - 5.11 MIL/uL Final  . Hemoglobin 10/10/2014 10.4* 12.0 - 15.0 g/dL Final  . HCT 10/10/2014 32.5* 36.0 - 46.0 % Final  . MCV 10/10/2014 89.0  78.0 - 100.0 fL Final  . MCH 10/10/2014  28.5  26.0 - 34.0 pg Final  . MCHC 10/10/2014 32.0  30.0 - 36.0 g/dL Final  . RDW 10/10/2014 13.4  11.5 - 15.5 % Final  . Platelets 10/10/2014 257  150 - 400 K/uL Final  . Sodium 10/10/2014 135  135 - 145 mmol/L Final  . Potassium 10/10/2014 4.4  3.5 - 5.1 mmol/L Final  . Chloride 10/10/2014 102  101 - 111 mmol/L Final  . CO2 10/10/2014 25  22 - 32 mmol/L Final  . Glucose, Bld 10/10/2014 139* 65 - 99 mg/dL Final  . BUN 10/10/2014 14  6 - 20 mg/dL Final  . Creatinine, Ser 10/10/2014 0.65  0.44 - 1.00 mg/dL Final  . Calcium 10/10/2014 8.6* 8.9 - 10.3 mg/dL Final  . GFR calc non Af Amer 10/10/2014 >60  >60 mL/min Final  . GFR calc Af Amer 10/10/2014 >60  >60 mL/min Final   Comment: (NOTE) The eGFR has been calculated using the CKD EPI equation. This calculation has not been validated in all clinical situations. eGFR's persistently <60 mL/min signify possible Chronic Kidney Disease.   . Anion gap 10/10/2014 8  5 - 15 Final  . Prothrombin Time 10/10/2014 15.4* 11.6 - 15.2 seconds Final  . INR 10/10/2014 1.20  0.00 - 1.49 Final  . WBC 10/11/2014 11.5* 4.0 - 10.5 K/uL Final  . RBC 10/11/2014 3.45* 3.87 - 5.11 MIL/uL Final  . Hemoglobin 10/11/2014 10.1* 12.0 - 15.0 g/dL Final  . HCT 10/11/2014 30.8* 36.0 - 46.0 % Final  . MCV 10/11/2014 89.3  78.0 - 100.0 fL Final  .  MCH 10/11/2014 29.3  26.0 - 34.0 pg Final  . MCHC 10/11/2014 32.8  30.0 - 36.0 g/dL Final  . RDW 10/11/2014 13.7  11.5 - 15.5 % Final  . Platelets 10/11/2014 240  150 - 400 K/uL Final  . Sodium 10/11/2014 138  135 - 145 mmol/L Final  . Potassium 10/11/2014 4.2  3.5 - 5.1 mmol/L Final  . Chloride 10/11/2014 102  101 - 111 mmol/L Final  . CO2 10/11/2014 28  22 - 32 mmol/L Final  . Glucose, Bld 10/11/2014 111* 65 - 99 mg/dL Final  . BUN 10/11/2014 11  6 - 20 mg/dL Final  . Creatinine, Ser 10/11/2014 0.71  0.44 - 1.00 mg/dL Final  . Calcium 10/11/2014 8.8* 8.9 - 10.3 mg/dL Final  . GFR calc non Af Amer 10/11/2014 >60  >60  mL/min Final  . GFR calc Af Amer 10/11/2014 >60  >60 mL/min Final   Comment: (NOTE) The eGFR has been calculated using the CKD EPI equation. This calculation has not been validated in all clinical situations. eGFR's persistently <60 mL/min signify possible Chronic Kidney Disease.   . Anion gap 10/11/2014 8  5 - 15 Final  . Prothrombin Time 10/11/2014 14.4  11.6 - 15.2 seconds Final  . INR 10/11/2014 1.10  0.00 - 1.49 Final  . WBC 10/12/2014 12.4* 4.0 - 10.5 K/uL Final  . RBC 10/12/2014 3.16* 3.87 - 5.11 MIL/uL Final  . Hemoglobin 10/12/2014 8.9* 12.0 - 15.0 g/dL Final  . HCT 10/12/2014 27.7* 36.0 - 46.0 % Final  . MCV 10/12/2014 87.7  78.0 - 100.0 fL Final  . MCH 10/12/2014 28.2  26.0 - 34.0 pg Final  . MCHC 10/12/2014 32.1  30.0 - 36.0 g/dL Final  . RDW 10/12/2014 13.8  11.5 - 15.5 % Final  . Platelets 10/12/2014 221  150 - 400 K/uL Final  . Prothrombin Time 10/12/2014 16.8* 11.6 - 15.2 seconds Final  . INR 10/12/2014 1.35  0.00 - 1.49 Final  . Prothrombin Time 10/13/2014 17.9* 11.6 - 15.2 seconds Final  . INR 10/13/2014 1.47  0.00 - 1.49 Final  . Prothrombin Time 10/14/2014 20.1* 11.6 - 15.2 seconds Final  . INR 10/14/2014 1.71* 0.00 - 1.49 Final  . WBC 10/14/2014 10.4  4.0 - 10.5 K/uL Final  . RBC 10/14/2014 3.03* 3.87 - 5.11 MIL/uL Final  . Hemoglobin 10/14/2014 8.5* 12.0 - 15.0 g/dL Final  . HCT 10/14/2014 26.5* 36.0 - 46.0 % Final  . MCV 10/14/2014 87.5  78.0 - 100.0 fL Final  . MCH 10/14/2014 28.1  26.0 - 34.0 pg Final  . MCHC 10/14/2014 32.1  30.0 - 36.0 g/dL Final  . RDW 10/14/2014 13.9  11.5 - 15.5 % Final  . Platelets 10/14/2014 257  150 - 400 K/uL Final  Hospital Outpatient Visit on 10/02/2014  Component Date Value Ref Range Status  . aPTT 10/02/2014 33  24 - 37 seconds Final  . WBC 10/02/2014 7.5  4.0 - 10.5 K/uL Final  . RBC 10/02/2014 4.70  3.87 - 5.11 MIL/uL Final  . Hemoglobin 10/02/2014 13.6  12.0 - 15.0 g/dL Final  . HCT 10/02/2014 42.2  36.0 - 46.0 % Final   . MCV 10/02/2014 89.8  78.0 - 100.0 fL Final  . MCH 10/02/2014 28.9  26.0 - 34.0 pg Final  . MCHC 10/02/2014 32.2  30.0 - 36.0 g/dL Final  . RDW 10/02/2014 13.8  11.5 - 15.5 % Final  . Platelets 10/02/2014 260  150 - 400 K/uL Final  .  Sodium 10/02/2014 142  135 - 145 mmol/L Final  . Potassium 10/02/2014 3.6  3.5 - 5.1 mmol/L Final  . Chloride 10/02/2014 102  101 - 111 mmol/L Final  . CO2 10/02/2014 29  22 - 32 mmol/L Final  . Glucose, Bld 10/02/2014 100* 65 - 99 mg/dL Final  . BUN 10/02/2014 23* 6 - 20 mg/dL Final  . Creatinine, Ser 10/02/2014 0.70  0.44 - 1.00 mg/dL Final  . Calcium 10/02/2014 9.5  8.9 - 10.3 mg/dL Final  . Total Protein 10/02/2014 7.8  6.5 - 8.1 g/dL Final  . Albumin 10/02/2014 4.0  3.5 - 5.0 g/dL Final  . AST 10/02/2014 18  15 - 41 U/L Final  . ALT 10/02/2014 14  14 - 54 U/L Final  . Alkaline Phosphatase 10/02/2014 120  38 - 126 U/L Final  . Total Bilirubin 10/02/2014 1.0  0.3 - 1.2 mg/dL Final  . GFR calc non Af Amer 10/02/2014 >60  >60 mL/min Final  . GFR calc Af Amer 10/02/2014 >60  >60 mL/min Final   Comment: (NOTE) The eGFR has been calculated using the CKD EPI equation. This calculation has not been validated in all clinical situations. eGFR's persistently <60 mL/min signify possible Chronic Kidney Disease.   . Anion gap 10/02/2014 11  5 - 15 Final  . Prothrombin Time 10/02/2014 14.0  11.6 - 15.2 seconds Final  . INR 10/02/2014 1.05  0.00 - 1.49 Final  . ABO/RH(D) 10/02/2014 O POS   Final  . Antibody Screen 10/02/2014 NEG   Final  . Sample Expiration 10/02/2014 10/12/2014   Final  . Color, Urine 10/02/2014 AMBER* YELLOW Final   BIOCHEMICALS MAY BE AFFECTED BY COLOR  . APPearance 10/02/2014 CLEAR  CLEAR Final  . Specific Gravity, Urine 10/02/2014 1.025  1.005 - 1.030 Final  . pH 10/02/2014 5.5  5.0 - 8.0 Final  . Glucose, UA 10/02/2014 NEGATIVE  NEGATIVE mg/dL Final  . Hgb urine dipstick 10/02/2014 NEGATIVE  NEGATIVE Final  . Bilirubin Urine  10/02/2014 NEGATIVE  NEGATIVE Final  . Ketones, ur 10/02/2014 NEGATIVE  NEGATIVE mg/dL Final  . Protein, ur 10/02/2014 NEGATIVE  NEGATIVE mg/dL Final  . Urobilinogen, UA 10/02/2014 0.2  0.0 - 1.0 mg/dL Final  . Nitrite 10/02/2014 NEGATIVE  NEGATIVE Final  . Leukocytes, UA 10/02/2014 SMALL* NEGATIVE Final  . MRSA, PCR 10/02/2014 NEGATIVE  NEGATIVE Final  . Staphylococcus aureus 10/02/2014 NEGATIVE  NEGATIVE Final   Comment:        The Xpert SA Assay (FDA approved for NASAL specimens in patients over 54 years of age), is one component of a comprehensive surveillance program.  Test performance has been validated by [facility] for patients greater than or equal to 78 year old. It is not intended to diagnose infection nor to guide or monitor treatment.   . ABO/RH(D) 10/02/2014 O POS   Final  . WBC, UA 10/02/2014 0-2  <3 WBC/hpf Final  . Bacteria, UA 10/02/2014 RARE  RARE Final  . Crystals 10/02/2014 CA OXALATE CRYSTALS* NEGATIVE Final  . Urine-Other 10/02/2014 MUCOUS PRESENT   Final     X-Rays:No results found.  EKG: Orders placed or performed during the hospital encounter of 06/21/14  . EKG 12 lead  . EKG 12 lead     Hospital Course:  Tara Moreno is a 69 y.o. who was admitted to [facility]. They were brought to the operating room on 10/09/2014 and underwent Procedure(s): BILATERAL TOTAL KNEE ARTHROPLASTY. Patient tolerated the procedure well  and was later transferred to the recovery room and then to the orthopaedic floor for postoperative care. They were given PO and IV analgesics for pain control following their surgery. They were given 24 hours of postoperative antibiotics of  Anti-infectives    Start   Dose/Rate Route Frequency Ordered Stop   10/09/14 1700  ceFAZolin (ANCEF) IVPB 2 g/50 mL premix    2 g 100 mL/hr over 30 Minutes Intravenous Every 6 hours 10/09/14 1441 10/09/14 2309   10/09/14 0845  ceFAZolin (ANCEF) IVPB 2 g/50 mL  premix    2 g 100 mL/hr over 30 Minutes Intravenous On call to O.R. 10/09/14 8916 10/09/14 1052     and started on DVT prophylaxis in the form of Lovenox and Coumadin. PT and OT were ordered for total joint protocol. Discharge planning consulted to help with postop disposition and equipment needs. CIR consult was called. An epidural was placed at the time of surgery for postop pain control. Patient had a tough night on the evening of surgery with pain but improved with PO and IV meds. Epidural was adjusted by the Anesthesia department They started to get up OOB with therapy on day one. Hemovac drains were pulled without difficulty. Continued to work with therapy into day two. Dressing was changed on day two and the incisions were healing well. Patient was seen in rounds on day two and was doing well. It was felt that as long as the patient continued to improve, they would be ready to transfer the following day, Saturday 10/12/2014 to the CIR unit if she was doing well and insurance had approved the stay. The patient will evaluated by the weekend coverage staff and will discharge if doing well. This summary was prepared in anticipation of the the patient's transfer over the weekend to the CIR unit is doing well and if insurance comes through.     ADDENDUM: The insurance did not come through for Saturday 10/12/2014 so the patient remained in the hospital over the weekend.  She was seen on Saturday and Sunday by the weekend coverage staff.  She still had moderate pain on Saturday POD 3 but continued to work with therapy taking a few steps in the room.  Seen again on Sunday POD 4 by the coverage staff.  Slowly progressing with therapy on walking short distances in the room. Seen again on Monday POD 5 by Dr. Wynelle Link.  She was doing fairly well and felt to be stable to go the the rehab unit.  No word form insurance going into the weekend and waiting for approval at the time of this addendum.   Will plan for transfer once approval comes through hopefully later today, Monday 10/14/2014.  ADDENDUM: Patient is ready to go to Rehab. Waiting on insurance. She was actually ready for transfer yesterday, 10/14/2014, but never heard back from insurance company. Rehab coordinator called twice and left messages twice yesterday with the insurance company attempting to get word on approval. There were beds available yesterday and could have accommodated the patient but was unable to transfer at that time due to no approval.  She remained in house and received further therapy. She is ready to go today and awaiting word and approval from insurance.  Take Coumadin for four weeks and then discontinue. The dose may need to be adjusted based upon the INR. Please follow the INR and titrate Coumadin dose for a therapeutic range between 2.0 and 3.0 INR. After completing the four weeks of Coumadin, the  patient may stop the Coumadin and then take an 81 mg Aspirin daily for three more weeks.  Continue Lovenox injections until the INR is therapeutic at or greater than 2.0. When INR reaches the therapeutic level of equal to or greater than 2.0, the patient may discontinue the Lovenox injections.   Diet: Cardiac diet Activity:WBAT Follow-up:in 2 weeks or following discharge from [facility] Disposition - CIR if approved Discharged Condition: good    Discharge Instructions    CPM    Complete by:  As directed   Continuous passive motion machine (CPM):      Use the CPM from Zero to 40 degrees for 4 hours per day per leg.      You may increase by 5-10 degrees per day.  You may break it up into 2 sessions per day.      Use CPM while on the CIR unit.     Call MD / Call 911    Complete by:  As directed   If you experience chest pain or shortness of breath, CALL 911 and be transported to the hospital emergency room.  If you develope a fever above 101 F, pus (white drainage) or increased drainage or redness at the  wound, or calf pain, call your surgeon's office.     Change dressing    Complete by:  As directed   Change dressing daily with sterile 4 x 4 inch gauze dressing and apply TED hose. Do not submerge the incision under water.     Constipation Prevention    Complete by:  As directed   Drink plenty of fluids.  Prune juice may be helpful.  You may use a stool softener, such as Colace (over the counter) 100 mg twice a day.  Use MiraLax (over the counter) for constipation as needed.     Diet - low sodium heart healthy    Complete by:  As directed      Discharge instructions    Complete by:  As directed   Pick up stool softner and laxative for home use following surgery while on pain medications. Do not submerge incision under water. Please use good hand washing techniques while changing dressing each day. May shower starting three days after surgery. Please use a clean towel to pat the incision dry following showers. Continue to use ice for pain and swelling after surgery. Do not use any lotions or creams on the incision until instructed by your surgeon.  Take Coumadin for four weeks and then discontinue.  The dose may need to be adjusted based upon the INR.  Please follow the INR and titrate Coumadin dose for a therapeutic range between 2.0 and 3.0 INR.  After completing the four weeks of Coumadin, the patient may stop the Coumadin and then take an 81 mg Aspirin daily for three more weeks.  Continue Lovenox injections until the INR is therapeutic at or greater than 2.0.  When INR reaches the therapeutic level of equal to or greater than 2.0, the patient may discontinue the Lovenox injections.  Postoperative Constipation Protocol  Constipation - defined medically as fewer than three stools per week and severe constipation as less than one stool per week.  One of the most common issues patients have following surgery is constipation.  Even if you have a regular bowel pattern at home, your normal  regimen is likely to be disrupted due to multiple reasons following surgery.  Combination of anesthesia, postoperative narcotics, change in appetite and fluid intake all  can affect your bowels.  In order to avoid complications following surgery, here are some recommendations in order to help you during your recovery period.  Colace (docusate) - Pick up an over-the-counter form of Colace or another stool softener and take twice a day as long as you are requiring postoperative pain medications.  Take with a full glass of water daily.  If you experience loose stools or diarrhea, hold the colace until you stool forms back up.  If your symptoms do not get better within 1 week or if they get worse, check with your doctor.  Dulcolax (bisacodyl) - Pick up over-the-counter and take as directed by the product packaging as needed to assist with the movement of your bowels.  Take with a full glass of water.  Use this product as needed if not relieved by Colace only.   MiraLax (polyethylene glycol) - Pick up over-the-counter to have on hand.  MiraLax is a solution that will increase the amount of water in your bowels to assist with bowel movements.  Take as directed and can mix with a glass of water, juice, soda, coffee, or tea.  Take if you go more than two days without a movement. Do not use MiraLax more than once per day. Call your doctor if you are still constipated or irregular after using this medication for 7 days in a row.  If you continue to have problems with postoperative constipation, please contact the office for further assistance and recommendations.  If you experience "the worst abdominal pain ever" or develop nausea or vomiting, please contact the office immediatly for further recommendations for treatment.  When discharged from the skilled rehab facility, please have the facility set up the patient's [place] prior to being released.  Please make sure this gets set up prior to  release in order to avoid any lapse of therapy following the rehab stay.  Also provide the patient with their medications at time of release from the facility to include their pain medication, the muscle relaxants, and their blood thinner medication.  If the patient is still at the rehab facility at time of follow up appointment, please also assist the patient in arranging follow up appointment in our office and any transportation needs. ICE to the affected knee or hip every three hours for 30 minutes at a time and then as needed for pain and swelling.     Do not put a pillow under the knee. Place it under the heel.    Complete by:  As directed      Do not sit on low chairs, stoools or toilet seats, as it may be difficult to get up from low surfaces    Complete by:  As directed      Driving restrictions    Complete by:  As directed   No driving until released by the physician.     Increase activity slowly as tolerated    Complete by:  As directed      Lifting restrictions    Complete by:  As directed   No lifting until released by the physician.     Patient may shower    Complete by:  As directed   You may shower without a dressing once there is no drainage.  Do not wash over the wound.  If drainage remains, do not shower until drainage stops.     TED hose    Complete by:  As directed   Use stockings (TED  hose) for 3 weeks on both leg(s).  You may remove them at night for sleeping.     Weight bearing as tolerated    Complete by:  As directed   Laterality:  bilateral  Extremity:  Lower            Medication List    STOP taking these medications        Fish Oil 1000 MG Caps     GREEN TEA EXTRACT PO     MILK THISTLE PO     multivitamin with minerals Tabs tablet     naproxen sodium 220 MG tablet  Commonly known as:  ANAPROX     oxyCODONE-acetaminophen 5-325 MG per tablet  Commonly known as:  ROXICET     PROBIOTIC DAILY PO     UBIQUINOL PO      TAKE these medications          acetaminophen 325 MG tablet  Commonly known as:  TYLENOL  Take 2 tablets (650 mg total) by mouth every 6 (six) hours as needed for mild pain (or Fever >/= 101).     atorvastatin 10 MG tablet  Commonly known as:  LIPITOR  Take 10 mg by mouth every morning.     AZOR 10-40 MG per tablet  Generic drug:  amLODipine-olmesartan  Take 1 tablet by mouth every morning.     bisacodyl 10 MG suppository  Commonly known as:  DULCOLAX  Place 1 suppository (10 mg total) rectally daily as needed for moderate constipation.     docusate sodium 100 MG capsule  Commonly known as:  COLACE  Take 1 capsule (100 mg total) by mouth 2 (two) times daily.     enoxaparin 30 MG/0.3ML injection  Commonly known as:  LOVENOX  Inject 0.3 mLs (30 mg total) into the skin every 12 (twelve) hours. Continue Lovenox injections until the INR is therapeutic at or greater than 2.0.  When INR reaches the therapeutic level of equal to or greater than 2.0, the patient may discontinue the Lovenox injections.     methocarbamol 500 MG tablet  Commonly known as:  ROBAXIN  Take 1 tablet (500 mg total) by mouth every 6 (six) hours as needed for muscle spasms.     metoCLOPramide 5 MG tablet  Commonly known as:  REGLAN  Take 1 tablet (5 mg total) by mouth every 8 (eight) hours as needed for nausea (if ondansetron (ZOFRAN) ineffective.).     niacin 500 MG tablet  Take 500 mg by mouth every morning.     ondansetron 4 MG tablet  Commonly known as:  ZOFRAN  Take 1 tablet (4 mg total) by mouth every 6 (six) hours as needed for nausea.     oxyCODONE 5 MG immediate release tablet  Commonly known as:  Oxy IR/ROXICODONE  Take 1-4 tablets (5-20 mg total) by mouth every 3 (three) hours as needed for moderate pain, severe pain or breakthrough pain.     pantoprazole 40 MG tablet  Commonly known as:  PROTONIX  Take 40 mg by mouth every morning.     polyethylene glycol packet  Commonly known as:  MIRALAX / GLYCOLAX  Take 17 g by  mouth daily as needed for mild constipation.     RESTASIS OP  Place 1 drop into both eyes daily as needed (dry eyes).     traMADol 50 MG tablet  Commonly known as:  ULTRAM  Take 1-2 tablets (50-100 mg total) by mouth every 6 (six) hours  as needed (mild to moderate pain).     warfarin 5 MG tablet  Commonly known as:  COUMADIN  Take 1 tablet (5 mg total) by mouth daily. Take Coumadin for four weeks and then discontinue.  The dose may need to be adjusted based upon the INR.  Please follow the INR and titrate Coumadin dose for a therapeutic range between 2.0 and 3.0 INR.  After completing the four weeks of Coumadin, the patient may stop the Coumadin and then take an 81 mg Aspirin daily for three more weeks.           Follow-up Information    Follow up with [name], MD. Schedule an appointment as soon as possible for a visit on 10/22/2014.   Specialty:  Orthopedic Surgery   Why:  Call office at [phone] to setup appointment on Tuesday 10/22/2014 with Dr. Wynelle Link or following discharge for the rehab center.   Contact information:   [address] [place] [address] [phone]       Signed: [name], PA-C Orthopaedic Surgery 10/14/2014, 7:06 AM

## 2014-10-14 NOTE — Progress Notes (Signed)
Rehab admissions - I called customer service at [facility].  They do have the clinicals that I faxed 10/11/14 am and the clinicals faxed today.  I have called and left 2 messages with the insurance case manager, Myrtie Soman.  I continue to wait to hear response from insurance case manager about acute inpatient rehab admission.  I do have beds available on rehab today.  Call me for questions.  #[phone]

## 2014-10-14 NOTE — Progress Notes (Signed)
   Subjective: 5 Days Post-Op Procedure(s) (LRB): BILATERAL TOTAL KNEE ARTHROPLASTY (Bilateral) Patient reports pain as mild.   Patient seen in rounds by Dr. Wynelle Link. Patient is well, and has had no acute complaints or problems Patient is ready to go to the [facility].  Objective: Vital signs in last 24 hours: Temp:  [98.8 F (37.1 C)-100.6 F (38.1 C)] 99.3 F (37.4 C) (06/06 0528) Pulse Rate:  [93-107] 93 (06/06 0528) Resp:  [16-18] 16 (06/06 0528) BP: (120)/(50-73) 120/73 mmHg (06/06 0528) SpO2:  [93 %-100 %] 94 % (06/06 0528)  Intake/Output from previous day:  Intake/Output Summary (Last 24 hours) at 10/14/14 0703 Last data filed at 10/14/14 0600  Gross per 24 hour  Intake 1866.67 ml  Output   1650 ml  Net 216.67 ml    Labs:  Recent Labs  10/12/14 0500 10/14/14 0515  HGB 8.9* 8.5*    Recent Labs  10/12/14 0500 10/14/14 0515  WBC 12.4* 10.4  RBC 3.16* 3.03*  HCT 27.7* 26.5*  PLT 221 257   No results for input(s): NA, K, CL, CO2, BUN, CREATININE, GLUCOSE, CALCIUM in the last 72 hours.  Recent Labs  10/13/14 0551 10/14/14 0515  INR 1.47 1.71*    EXAM: General - Patient is Alert and Appropriate Extremity - Neurovascular intact Sensation intact distally Dorsiflexion/Plantar flexion intact Incision - clean, dry, no drainage Motor Function - intact, moving foot and toes well on exam.   Assessment/Plan: 5 Days Post-Op Procedure(s) (LRB): BILATERAL TOTAL KNEE ARTHROPLASTY (Bilateral) Procedure(s) (LRB): BILATERAL TOTAL KNEE ARTHROPLASTY (Bilateral) Past Medical History  Diagnosis Date  . Hypertension   . Arthritis   . PONV (postoperative nausea and vomiting)   . Frequent urination at night   . GERD (gastroesophageal reflux disease)   . Hyperlipemia    Principal Problem:   OA (osteoarthritis) of knee  Estimated body mass index is 38.99 kg/(m^2) as calculated from the following:   Height as of this encounter: 5\' 7"  (1.702 m).   Weight as  of this encounter: 112.946 kg (249 lb). Discharge to Rehab Diet - Cardiac diet Follow up - in 2 weeks from surgery or following discharge from [facility] to both legs Disposition - Rehab Condition Upon Discharge - Good D/C Meds - See DC Summary DVT Prophylaxis - Lovenox and Coumadin   Take Coumadin for a total of four weeks and then discontinue.  The dose may need to be adjusted based upon the INR.  Please follow the INR and titrate Coumadin dose for a therapeutic range between 2.0 and 3.0 INR.  After completing the four weeks of Coumadin, the patient may stop the Coumadin and then take an 81 mg Aspirin daily for three more weeks.  Continue Lovenox injections until the INR is therapeutic at or greater than 2.0.  When INR reaches the therapeutic level of equal to or greater than 2.0, the patient may discontinue the Lovenox injections.   [name], PA-C Orthopaedic Surgery 10/14/2014, 7:03 AM

## 2014-10-15 LAB — PROTIME-INR
INR: 1.85 — ABNORMAL HIGH (ref 0.00–1.49)
PROTHROMBIN TIME: 21.3 s — AB (ref 11.6–15.2)

## 2014-10-15 NOTE — Care Management Note (Signed)
Case Management Note  Patient Details  Name: Tara Moreno MRN: [id number] Date of Birth: [birth year]  Subjective/Objective:                   BILATERAL TOTAL KNEE ARTHROPLASTY (Bilateral) Action/Plan:  Discharge planning Expected Discharge Date:  10/15/14               Expected Discharge Plan:  SNF In-House Referral:  Clinical Social Work  Discharge planning Services  CM Consult  Post Acute Care Choice:    Choice offered to:     DME Arranged:    DME Agency:     HH Arranged:    [place] Agency:     Status of Service:  Completed, signed off  Medicare Important Message Given:  Yes Date Medicare IM Given:  10/12/14 Medicare IM give by:  Dessa Phi Date Additional Medicare IM Given:    Additional Medicare Important Message give by:     If discussed at [place] of Stay Meetings, dates discussed:    Additional Comments: Insurance denied pt's CIR request; CSW aware and arranging SNF for rehab.  NO other CM needs were communicated. Dellie Catholic, RN 10/15/2014, 1:02 PM

## 2014-10-15 NOTE — Clinical Social Work Placement (Signed)
   CLINICAL SOCIAL WORK PLACEMENT  NOTE  Date:  10/15/2014  Patient Details  Name: Tara Moreno MRN: [id number] Date of Birth: Oct 11, 1945  Clinical Social Work is seeking post-discharge placement for this patient at the [place] level of care (*CSW will initial, date and re-position this form in  chart as items are completed):  Yes   Patient/family provided with [place] Work Department's list of facilities offering this level of care within the geographic area requested by the patient (or if unable, by the patient's family).  Yes   Patient/family informed of their freedom to choose among providers that offer the needed level of care, that participate in Medicare, Medicaid or managed care program needed by the patient, have an available bed and are willing to accept the patient.  Yes   Patient/family informed of [place]'s ownership interest in [facility] and [facility], as well as of the fact that they are under no obligation to receive care at these facilities.  PASRR submitted to EDS on 10/10/14     PASRR number received on 10/10/14     Existing PASRR number confirmed on       FL2 transmitted to all facilities in geographic area requested by pt/family on 10/10/14     FL2 transmitted to all facilities within larger geographic area on       Patient informed that his/her managed care company has contracts with or will negotiate with certain facilities, including the following:        Yes   Patient/family informed of bed offers received.  Patient [facility] bed at [facility]     Physician recommends and patient chooses bed at      Patient to be transferred to [facility] on  .  Patient to be transferred to facility by       Patient family notified on   of transfer.  Name of family member notified:        PHYSICIAN       Additional Comment:    _______________________________________________ Luretha Rued,  LCSW 10/15/2014, 10:49 AM

## 2014-10-15 NOTE — Progress Notes (Signed)
Report given to [facility] at [facility].

## 2014-10-15 NOTE — Clinical Social Work Note (Signed)
Clinical Social Work Assessment  Patient Details  Name: Tara Moreno MRN: [id number] Date of Birth: 09-10-1945  Date of referral:  10/15/14               Reason for consult:  Discharge Planning                Permission sought to share information with:    Permission granted to share information::     Name::        Agency::     Relationship::     Contact Information:     Housing/Transportation Living arrangements for the past 2 months:  Single Family Home Source of Information:  Patient Patient Interpreter Needed:  None Criminal Activity/Legal Involvement Pertinent to Current Situation/Hospitalization:  No - Comment as needed Significant Relationships:  Spouse Lives with:  Spouse Do you feel safe going back to the place where you live?   (Rehab needed.) Need for family participation in patient care:  No (Coment)  Care giving concerns: Pt's care cannot be managed at home following surgery.   Social Worker assessment / plan:  Pt hospitalized on 6/1 for pre planned bilateral knee surgery. MD/Pt recommended CIR following hospital d/c. [employer] HMO denied placement at CIR. Pt has chosen [facility] for [employer]. Silvio Pate from [facility] contacted [phone] ext [phone] ) and CSW was told that SNF would be authorized for d/c today to [facility]. SNF contacted and instructed to submit clinicals. Pt is aware and in agreement with [facility] at [facility].  Employment status:  Retired Nurse, adult PT Recommendations:  Inpatient [place], [place] / Referral to community resources:  [place]  Patient/Family's Response to care:  Pt is satisfied with care received. Agrees rehab is needed.  Patient/Family's Understanding of and Emotional Response to Diagnosis, Current Treatment, and Prognosis:  Pt is disappointed with her insurance company's denial for placement at CIR. She is motivated to begin rehab  at [facility].  Emotional Assessment Appearance:  Appears stated age Attitude/Demeanor/Rapport:  Other (cooperative) Affect (typically observed):  Appropriate, Pleasant Orientation:  Oriented to Self, Oriented to Place, Oriented to  Time Alcohol / Substance use:    Psych involvement (Current and /or in the community):  No (Comment)  Discharge Needs  Concerns to be addressed:  Discharge Planning Concerns Readmission within the last 30 days:  No Current discharge risk:  None Barriers to Discharge:  No Barriers Identified   Danilo Cappiello, Randall An, LCSW 10/15/2014, 10:35 AM

## 2014-10-15 NOTE — Progress Notes (Signed)
Subjective: 6 Days Post-Op Procedure(s) (LRB): BILATERAL TOTAL KNEE ARTHROPLASTY (Bilateral) Patient reports pain as mild.   Patient seen in rounds with Dr. Wynelle Link. Patient is well, but has had some minor complaints of pain in the knees, requiring pain medications Patient is ready to go to Rehab.  Waiting on insurance. She was actually ready for transfer yesterday but never heard back from insurance company. Rehab coordinator called twice and left messages twice yesterday with the insurance company attempting to get word on approval.  There were beds available yesterday and could have accommodated the patient but was unable to transfer at that time due to no approval.  She remained in house and received further therapy.  She is ready to go today and awaiting word and approval from insurance.  Objective: Vital signs in last 24 hours: Temp:  [99.1 F (37.3 C)-101.2 F (38.4 C)] 99.4 F (37.4 C) (06/07 0558) Pulse Rate:  [90-98] 90 (06/07 0558) Resp:  [16] 16 (06/07 0558) BP: (101-123)/(47-56) 121/52 mmHg (06/07 0558) SpO2:  [95 %-100 %] 95 % (06/07 0558)  Intake/Output from previous day:  Intake/Output Summary (Last 24 hours) at 10/15/14 0747 Last data filed at 10/14/14 2353  Gross per 24 hour  Intake    720 ml  Output   1950 ml  Net  -1230 ml    Labs:  Recent Labs  10/14/14 0515  HGB 8.5*    Recent Labs  10/14/14 0515  WBC 10.4  RBC 3.03*  HCT 26.5*  PLT 257   No results for input(s): NA, K, CL, CO2, BUN, CREATININE, GLUCOSE, CALCIUM in the last 72 hours.  Recent Labs  10/14/14 0515 10/15/14 0440  INR 1.71* 1.85*    EXAM: General - Patient is Alert, Appropriate and Oriented Extremity - Neurovascular intact Sensation intact distally Intact pulses distally No cellulitis present Incision - clean, dry, no drainage, healing Motor Function - intact, moving foot and toes well on exam.   Assessment/Plan: 6 Days Post-Op Procedure(s) (LRB): BILATERAL TOTAL  KNEE ARTHROPLASTY (Bilateral) Procedure(s) (LRB): BILATERAL TOTAL KNEE ARTHROPLASTY (Bilateral) Past Medical History  Diagnosis Date  . Hypertension   . Arthritis   . PONV (postoperative nausea and vomiting)   . Frequent urination at night   . GERD (gastroesophageal reflux disease)   . Hyperlipemia    Principal Problem:   OA (osteoarthritis) of knee  Estimated body mass index is 38.99 kg/(m^2) as calculated from the following:   Height as of this encounter: 5\' 7"  (1.702 m).   Weight as of this encounter: 112.946 kg (249 lb).  Discharge to Rehab Diet - Cardiac diet Follow up - in 2 weeks from surgery or following discharge from [place] to both legs Disposition - Rehab Condition Upon Discharge - Good D/C Meds - See DC Summary DVT Prophylaxis - Lovenox and Coumadin   Take Coumadin for a total of four weeks and then discontinue. The dose may need to be adjusted based upon the INR. Please follow the INR and titrate Coumadin dose for a therapeutic range between 2.0 and 3.0 INR. After completing the four weeks of Coumadin, the patient may stop the Coumadin and then take an 81 mg Aspirin daily for three more weeks.  Continue Lovenox injections until the INR is therapeutic at or greater than 2.0. When INR reaches the therapeutic level of equal to or greater than 2.0, the patient may discontinue the Lovenox injections.  [name], PA-C Orthopaedic Surgery 10/15/2014, 7:47 AM

## 2014-10-15 NOTE — Progress Notes (Signed)
Physical Therapy Treatment Patient Details Name: Tara Moreno MRN: [id number] DOB: 07-16-1945 Today's Date: 10/15/2014    History of Present Illness Bil TKR ; s/p left reverse total shoulder ~3 months ago, had right reverse total shoulder in Oct 2015.    PT Comments      Follow Up Recommendations  CIR     Equipment Recommendations  None recommended by PT    Recommendations for Other Services OT consult     Precautions / Restrictions Precautions Precautions: Fall;Knee Required Braces or Orthoses: Knee Immobilizer - Right;Knee Immobilizer - Left Knee Immobilizer - Right: Discontinue once straight leg raise with < 10 degree lag Knee Immobilizer - Left: Discontinue once straight leg raise with < 10 degree lag Restrictions Weight Bearing Restrictions: No Other Position/Activity Restrictions: WBAT    Mobility  Bed Mobility                  Transfers Overall transfer level: Needs assistance Equipment used: Rolling walker (2 wheeled) Transfers: Sit to/from Stand Sit to Stand: Mod assist Stand pivot transfers: Mod assist       General transfer comment: Assist to rise and steady, cues for LE management and use of UEs.  Ambulation/Gait             General Gait Details: deferred this am at pt request   Stairs            Wheelchair Mobility    Modified Rankin (Stroke Patients Only)       Balance                                    Cognition Arousal/Alertness: Awake/alert Behavior During Therapy: WFL for tasks assessed/performed Overall Cognitive Status: Within Functional Limits for tasks assessed                      Exercises Total Joint Exercises Ankle Circles/Pumps: AROM;Both;Supine;20 reps Quad Sets: AROM;Both;Supine;20 reps Heel Slides: AAROM;Both;Supine;20 reps Hip ABduction/ADduction: AAROM;Both;10 reps;Supine Straight Leg Raises: Both;Supine;20 reps;AAROM;AROM Goniometric ROM: Bil knees -10- 55     General Comments        Pertinent Vitals/Pain Pain Assessment: 0-10 Pain Score: 4  Pain Location: Bil knees Pain Descriptors / Indicators: Aching Pain Intervention(s): Limited activity within patient's tolerance;Monitored during session;Premedicated before session;Ice applied    Home Living                      Prior Function            PT Goals (current goals can now be found in the care plan section) Acute Rehab PT Goals Patient Stated Goal: Rehab and then home to walk without pain PT Goal Formulation: With patient Time For Goal Achievement: 10/17/14 Potential to Achieve Goals: Fair Progress towards PT goals: Progressing toward goals    Frequency  7X/week    PT Plan Current plan remains appropriate    Co-evaluation             End of Session Equipment Utilized During Treatment: Gait belt;Left knee immobilizer Activity Tolerance: Patient tolerated treatment well Patient left: in chair;with call bell/phone within reach     Time: 0950-1020 PT Time Calculation (min) (ACUTE ONLY): 30 min  Charges:  $Therapeutic Exercise: 8-22 mins $Therapeutic Activity: 8-22 mins                    G  Codes:      Ferris Tally 10/15/2014, 11:09 AM

## 2014-10-15 NOTE — Progress Notes (Signed)
Rehab admissions - I have received a denial for acute inpatient rehab admission.  I have talked to the patient and her husband.  I have let the social worker and the charge nurse know of denial.  Patient is interested in going to [facility] for her rehab.  Call me for questions.  #[phone]

## 2014-10-15 NOTE — Clinical Social Work Placement (Signed)
   CLINICAL SOCIAL WORK PLACEMENT  NOTE  Date:  10/15/2014  Patient Details  Name: Tara Moreno MRN: [id number] Date of Birth: [birth year]  Clinical Social Work is seeking post-discharge placement for this patient at the [place] level of care (*CSW will initial, date and re-position this form in  chart as items are completed):  Yes   Patient/family provided with [place] Work Department's list of facilities offering this level of care within the geographic area requested by the patient (or if unable, by the patient's family).  Yes   Patient/family informed of their freedom to choose among providers that offer the needed level of care, that participate in Medicare, Medicaid or managed care program needed by the patient, have an available bed and are willing to accept the patient.  Yes   Patient/family informed of [place]'s ownership interest in [facility] and [facility], as well as of the fact that they are under no obligation to receive care at these facilities.  PASRR submitted to EDS on 10/10/14     PASRR number received on 10/10/14     Existing PASRR number confirmed on       FL2 transmitted to all facilities in geographic area requested by pt/family on 10/10/14     FL2 transmitted to all facilities within larger geographic area on       Patient informed that his/her managed care company has contracts with or will negotiate with certain facilities, including the following:        Yes   Patient/family informed of bed offers received.  Patient chooses bed at [facility]     Physician recommends and patient chooses bed at      Patient to be transferred to [facility] on  .  Patient to be transferred to facility [facility]     Patient family notified on 10/15/14 of transfer.  Name of family member notified:  Spouse     PHYSICIAN       Additional Comment: Pt / spouse are in agreement with d/c to [facility]  today. [employer] medicare has provided authorization. [facility] transport is required. Med necessity form completed. NSG reviewed d/c summary, avs, scripts. Scripts included in d/c packet.   _______________________________________________ Luretha Rued, LCSW 10/15/2014, 2:37 PM

## 2014-10-15 NOTE — Progress Notes (Signed)
Rehab admissions - Call placed to insurance case manager this am regarding possible inpatient rehab admission.  We will have an answer today, but I do not know what time.  Recommend preparation for back up SNF placement in case we get a denial or in case insurance carrier continues to delay.  However, I do think I will have an answer this morning.  Call me for questions.  #[phone]

## 2014-10-16 ENCOUNTER — Non-Acute Institutional Stay (SKILLED_NURSING_FACILITY): Payer: Medicare HMO | Admitting: Internal Medicine

## 2014-10-16 DIAGNOSIS — E785 Hyperlipidemia, unspecified: Secondary | ICD-10-CM

## 2014-10-16 DIAGNOSIS — D62 Acute posthemorrhagic anemia: Secondary | ICD-10-CM

## 2014-10-16 DIAGNOSIS — M170 Bilateral primary osteoarthritis of knee: Secondary | ICD-10-CM

## 2014-10-16 DIAGNOSIS — E507 Other ocular manifestations of vitamin A deficiency: Secondary | ICD-10-CM | POA: Diagnosis not present

## 2014-10-16 DIAGNOSIS — K5901 Slow transit constipation: Secondary | ICD-10-CM | POA: Diagnosis not present

## 2014-10-16 DIAGNOSIS — R2681 Unsteadiness on feet: Secondary | ICD-10-CM | POA: Diagnosis not present

## 2014-10-16 DIAGNOSIS — Z299 Encounter for prophylactic measures, unspecified: Secondary | ICD-10-CM

## 2014-10-16 DIAGNOSIS — I10 Essential (primary) hypertension: Secondary | ICD-10-CM

## 2014-10-16 DIAGNOSIS — Z7901 Long term (current) use of anticoagulants: Secondary | ICD-10-CM

## 2014-10-16 DIAGNOSIS — K219 Gastro-esophageal reflux disease without esophagitis: Secondary | ICD-10-CM | POA: Diagnosis not present

## 2014-10-16 NOTE — Progress Notes (Signed)
Patient ID: Tara Moreno, female   DOB: [birth year], 69 y.o.   MRN: [id number]      Facility: [facility]    PCP: Patricia Nettle, MD  Code Status: full code  No Known Allergies  Chief Complaint  Patient presents with  . New Admit To SNF    new admission     HPI:  69 year old patient is here for short term rehabilitation post hospital admission from 10/09/14-10/15/14 with bilateral severe knee osteoarthritis. She underwent bilateral total knee arthroplasty. She is seen in her room today. Her pain is under control with current regimen. She has not had a bowel movement since 10/08/14. Denies any abdominal pain, nausea or vomiting. Has been passing flatus. No other concerns.  Review of Systems:  Constitutional: Negative for fever, chills, diaphoresis.  HENT: Negative for headache, congestion, nasal discharge Eyes: Negative for eye pain, blurred vision, double vision and discharge.  Respiratory: Negative for cough, shortness of breath and wheezing.   Cardiovascular: Negative for chest pain, palpitations, leg swelling.  Gastrointestinal: Negative for heartburn, nausea, vomiting, abdominal pain. Appetite is fair Genitourinary: Negative for dysuria and flank pain.  Musculoskeletal: Negative for back pain, falls Skin: Negative for itching, rash.  Neurological: Negative for dizziness, tingling, focal weakness Psychiatric/Behavioral: Negative for depression.    Past Medical History  Diagnosis Date  . Hypertension   . Arthritis   . PONV (postoperative nausea and vomiting)   . Frequent urination at night   . GERD (gastroesophageal reflux disease)   . Hyperlipemia    Past Surgical History  Procedure Laterality Date  . Abdominal hysterectomy    . Removal of the renal tumor    . Tonsillectomy    . Partial hysterectomy    . Banectomy    . Colonoscopy    . Breast enhancement surgery  2000  . Cardiac catheterization      no PCI; had before renal tumor  was removed  . Revision total shoulder arthroplasty Right 03/08/2014    dr Veverly Fells  . Reverse shoulder arthroplasty Right 03/08/2014    Procedure: RIGHT TOTAL SHOULDER ARTHROPLASTY VERSES A REVERSE ARTHROPLASTY;  Surgeon: Augustin Schooling, MD;  Location: [place];  Service: Orthopedics;  Laterality: Right;  . Reverse shoulder arthroplasty Left 07/05/2014    Procedure: LEFT REVERSE SHOULDER ARTHROPLASTY;  Surgeon: Augustin Schooling, MD;  Location: [place];  Service: Orthopedics;  Laterality: Left;  . Total knee arthroplasty Bilateral 10/09/2014    Procedure: BILATERAL TOTAL KNEE ARTHROPLASTY;  Surgeon: Gaynelle Arabian, MD;  Location: [facility];  Service: Orthopedics;  Laterality: Bilateral;  with general anesthesia   Social History:   reports that she has never smoked. She has never used smokeless tobacco. She reports that she does not drink alcohol or use illicit drugs.  Family History  Problem Relation Age of Onset  . Hypertension Mother   . Hypertension Father   . Hypertension Sister   . Hypertension Brother   . Hypertension Other     Medications: Patient's Medications  New Prescriptions   No medications on file  Previous Medications   ACETAMINOPHEN (TYLENOL) 325 MG TABLET    Take 2 tablets (650 mg total) by mouth every 6 (six) hours as needed for mild pain (or Fever >/= 101).   AMLODIPINE-OLMESARTAN (AZOR) 10-40 MG PER TABLET    Take 1 tablet by mouth every morning.    ATORVASTATIN (LIPITOR) 10 MG TABLET    Take 10 mg by mouth every morning.  BISACODYL (DULCOLAX) 10 MG SUPPOSITORY    Place 1 suppository (10 mg total) rectally daily as needed for moderate constipation.   CYCLOSPORINE (RESTASIS OP)    Place 1 drop into both eyes daily as needed (dry eyes).    DOCUSATE SODIUM (COLACE) 100 MG CAPSULE    Take 1 capsule (100 mg total) by mouth 2 (two) times daily.   ENOXAPARIN (LOVENOX) 30 MG/0.3ML INJECTION    Inject 0.3 mLs (30 mg total) into the skin every 12 (twelve) hours. Continue Lovenox  injections until the INR is therapeutic at or greater than 2.0.  When INR reaches the therapeutic level of equal to or greater than 2.0, the patient may discontinue the Lovenox injections.   METHOCARBAMOL (ROBAXIN) 500 MG TABLET    Take 1 tablet (500 mg total) by mouth every 6 (six) hours as needed for muscle spasms.   METOCLOPRAMIDE (REGLAN) 5 MG TABLET    Take 1 tablet (5 mg total) by mouth every 8 (eight) hours as needed for nausea (if ondansetron (ZOFRAN) ineffective.).   NIACIN 500 MG TABLET    Take 500 mg by mouth every morning.    ONDANSETRON (ZOFRAN) 4 MG TABLET    Take 1 tablet (4 mg total) by mouth every 6 (six) hours as needed for nausea.   OXYCODONE (OXY IR/ROXICODONE) 5 MG IMMEDIATE RELEASE TABLET    Take 1-4 tablets (5-20 mg total) by mouth every 3 (three) hours as needed for moderate pain, severe pain or breakthrough pain.   PANTOPRAZOLE (PROTONIX) 40 MG TABLET    Take 40 mg by mouth every morning.    POLYETHYLENE GLYCOL (MIRALAX / GLYCOLAX) PACKET    Take 17 g by mouth daily as needed for mild constipation.   TRAMADOL (ULTRAM) 50 MG TABLET    Take 1-2 tablets (50-100 mg total) by mouth every 6 (six) hours as needed (mild to moderate pain).   WARFARIN (COUMADIN) 5 MG TABLET    Take 1 tablet (5 mg total) by mouth daily. Take Coumadin for four weeks and then discontinue.  The dose may need to be adjusted based upon the INR.  Please follow the INR and titrate Coumadin dose for a therapeutic range between 2.0 and 3.0 INR.  After completing the four weeks of Coumadin, the patient may stop the Coumadin and then take an 81 mg Aspirin daily for three more weeks.  Modified Medications   No medications on file  Discontinued Medications   No medications on file     Physical Exam: Filed Vitals:   10/16/14 1116  BP: 134/73  Pulse: 83  Temp: 98.4 F (36.9 C)  Resp: 18  SpO2: 97%    General- elderly female,obese, in no acute distress Head- normocephalic, atraumatic Throat- moist mucus  membrane Eyes- PERRLA, EOMI, no pallor, no icterus, no discharge, normal conjunctiva, normal sclera Neck- no cervical lymphadenopathy Cardiovascular- normal s1,s2, no murmurs, palpable dorsalis pedis and radial pulses, trace bilateral leg edema Respiratory- bilateral clear to auscultation, no wheeze, no rhonchi, no crackles, no use of accessory muscles Abdomen- bowel sounds present, soft, non tender, no guarding/ rigidity Musculoskeletal- able to move all 4 extremities, limited ROM with knee at present Neurological- no focal deficit Skin- warm and dry, both knee surgical incision with steri strips, incision looks fine, dressing on top clean and dry Psychiatry- alert and oriented to person, place and time, normal mood and affect    Labs reviewed: Basic Metabolic Panel:  Recent Labs  10/02/14 1145 10/10/14 0420 10/11/14 0435  NA 142 135 138  K 3.6 4.4 4.2  CL 102 102 102  CO2 29 25 28   GLUCOSE 100* 139* 111*  BUN 23* 14 11  CREATININE 0.70 0.65 0.71  CALCIUM 9.5 8.6* 8.8*   Liver Function Tests:  Recent Labs  10/02/14 1145  AST 18  ALT 14  ALKPHOS 120  BILITOT 1.0  PROT 7.8  ALBUMIN 4.0   No results for input(s): LIPASE, AMYLASE in the last 8760 hours. No results for input(s): AMMONIA in the last 8760 hours. CBC:  Recent Labs  10/11/14 0435 10/12/14 0500 10/14/14 0515  WBC 11.5* 12.4* 10.4  HGB 10.1* 8.9* 8.5*  HCT 30.8* 27.7* 26.5*  MCV 89.3 87.7 87.5  PLT 240 221 257   CBG:  Recent Labs  03/09/14 1221  GLUCAP 117*     Assessment/Plan  Gait instability S/p both knee surgery. Will have her work with physical therapy and occupational therapy team to help with gait training and muscle strengthening exercises.fall precautions. Skin care. Encourage to be out of bed.   Knee osteoarthritis S/p bilateral total knee arthroplasty. Pain under control with current regimen of oxyIR 5 mg 1-3 tab every 3 hour as needed for pain.continue robaxin 500 mg q6 hour  as needed for muscle spasm. continue CPM machine. Continue incision care. Has f/u with orthopedics. On coumadin and lovenox for dvt prophylaxis. inr today 2.3. D/c lovenox, continue coumadin 5 mg daily and check inr 10/18/14. Goal inr 2-3  On coumadin for 4 weeks, then to d/c coumadin and start aspirin 81 mg daily  Constipation Constipated, on bisacodyl suppository prn with colace 100 mg bid and prn miralax. Change miralax to 17 g bid for 3 days, then daily. Also d/c colace and start senna s 2 tab daily. Encouraged hydration. Pt would like prune juice with meals.  Blood loss anemia Post op, check cbc in 1 week  dvt prophylaxis On coumadin and lovenox for dvt prophylaxis. inr today 2.3. D/c lovenox, continue coumadin 5 mg daily and check inr 10/18/14. Goal inr 2-3  On coumadin for 4 weeks, then to d/c coumadin and start aspirin 81 mg daily  HTN Stable bp reading, monitor bp, continue azor 10-40 mg daily  gerd Denies symptoms, on home regimen of protonix 40 mg daily  Hyperlipidemia Stable, continue lipitor 10 mg daily  Xerophthalmia Continue restasis daily as needed   Goals of care: short term rehabilitation   Labs/tests ordered: cbc in 1 week  Family/ staff Communication: reviewed care plan with patient and nursing supervisor    Blanchie Serve, MD  [place] [phone] (Monday-Friday 8 am - 5 pm) [phone] (afterhours)

## 2014-10-24 ENCOUNTER — Other Ambulatory Visit: Payer: Self-pay | Admitting: *Deleted

## 2014-10-24 MED ORDER — OXYCODONE HCL 5 MG PO TABS: 5.0000 mg | ORAL_TABLET | ORAL | Status: DC | PRN

## 2014-10-24 NOTE — Telephone Encounter (Signed)
[facility] 

## 2014-10-30 ENCOUNTER — Non-Acute Institutional Stay: Payer: Medicare HMO | Admitting: Nurse Practitioner

## 2014-10-30 DIAGNOSIS — Z299 Encounter for prophylactic measures, unspecified: Secondary | ICD-10-CM

## 2014-10-30 DIAGNOSIS — Z7901 Long term (current) use of anticoagulants: Secondary | ICD-10-CM | POA: Diagnosis not present

## 2014-10-30 DIAGNOSIS — M170 Bilateral primary osteoarthritis of knee: Secondary | ICD-10-CM

## 2014-10-30 DIAGNOSIS — K5901 Slow transit constipation: Secondary | ICD-10-CM

## 2014-10-30 DIAGNOSIS — I10 Essential (primary) hypertension: Secondary | ICD-10-CM | POA: Diagnosis not present

## 2014-10-30 DIAGNOSIS — Z96619 Presence of unspecified artificial shoulder joint: Secondary | ICD-10-CM

## 2014-10-30 DIAGNOSIS — D62 Acute posthemorrhagic anemia: Secondary | ICD-10-CM

## 2014-10-30 NOTE — Progress Notes (Signed)
Patient ID: Tara Moreno, female   DOB: 08-28-1945, 69 y.o.   MRN: [id number]    Nursing Home Location:  [place] of Service: SNF (31)  PCP: Patricia Nettle, MD  No Known Allergies  Chief Complaint  Patient presents with  . Discharge Note    HPI:  Patient is a 69 y.o. female seen today at [facility] for discharge home. Pt with a pmh of arthritis, htn, GERD, hyperlipidemia. Pt at [facility] for short term rehab after hospitalization from 10/09/14-10/15/14 with bilateral severe knee osteoarthritis. She underwent bilateral total knee arthroplasty. Pt reports pain is well controlled with current regimen. Patient currently doing well with therapy, now stable to discharge home with home health.  Review of Systems:  Review of Systems  Constitutional: Negative for activity change, appetite change, fatigue and unexpected weight change.  HENT: Negative for congestion and hearing loss.   Eyes: Negative.   Respiratory: Negative for cough and shortness of breath.   Cardiovascular: Negative for chest pain, palpitations and leg swelling.  Gastrointestinal: Negative for abdominal pain, diarrhea and constipation.  Genitourinary: Negative for dysuria and difficulty urinating.  Musculoskeletal: Negative for myalgias and arthralgias.  Skin: Negative for color change and wound.  Neurological: Negative for dizziness and weakness.  Psychiatric/Behavioral: Negative for behavioral problems, confusion and agitation.    Past Medical History  Diagnosis Date  . Hypertension   . Arthritis   . PONV (postoperative nausea and vomiting)   . Frequent urination at night   . GERD (gastroesophageal reflux disease)   . Hyperlipemia    Past Surgical History  Procedure Laterality Date  . Abdominal hysterectomy    . Removal of the renal tumor    . Tonsillectomy    . Partial hysterectomy    . Banectomy    . Colonoscopy    . Breast enhancement surgery  2000    . Cardiac catheterization      no PCI; had before renal tumor was removed  . Revision total shoulder arthroplasty Right 03/08/2014    dr Veverly Fells  . Reverse shoulder arthroplasty Right 03/08/2014    Procedure: RIGHT TOTAL SHOULDER ARTHROPLASTY VERSES A REVERSE ARTHROPLASTY;  Surgeon: Augustin Schooling, MD;  Location: [place];  Service: Orthopedics;  Laterality: Right;  . Reverse shoulder arthroplasty Left 07/05/2014    Procedure: LEFT REVERSE SHOULDER ARTHROPLASTY;  Surgeon: Augustin Schooling, MD;  Location: [place];  Service: Orthopedics;  Laterality: Left;  . Total knee arthroplasty Bilateral 10/09/2014    Procedure: BILATERAL TOTAL KNEE ARTHROPLASTY;  Surgeon: Gaynelle Arabian, MD;  Location: [facility];  Service: Orthopedics;  Laterality: Bilateral;  with general anesthesia   Social History:   reports that she has never smoked. She has never used smokeless tobacco. She reports that she does not drink alcohol or use illicit drugs.  Family History  Problem Relation Age of Onset  . Hypertension Mother   . Hypertension Father   . Hypertension Sister   . Hypertension Brother   . Hypertension Other     Medications: Patient's Medications  New Prescriptions   No medications on file  Previous Medications   ACETAMINOPHEN (TYLENOL) 325 MG TABLET    Take 2 tablets (650 mg total) by mouth every 6 (six) hours as needed for mild pain (or Fever >/= 101).   AMLODIPINE (NORVASC) 10 MG TABLET    Take 10 mg by mouth daily.   ATORVASTATIN (LIPITOR) 10 MG TABLET  Take 10 mg by mouth every morning.    BISACODYL (DULCOLAX) 10 MG SUPPOSITORY    Place 1 suppository (10 mg total) rectally daily as needed for moderate constipation.   CYCLOSPORINE (RESTASIS OP)    Place 1 drop into both eyes daily as needed (dry eyes).    DOCUSATE SODIUM (COLACE) 100 MG CAPSULE    Take 1 capsule (100 mg total) by mouth 2 (two) times daily.   LOSARTAN (COZAAR) 100 MG TABLET    Take 100 mg by mouth daily.   METHOCARBAMOL (ROBAXIN) 500 MG  TABLET    Take 1 tablet (500 mg total) by mouth every 6 (six) hours as needed for muscle spasms.   METOCLOPRAMIDE (REGLAN) 5 MG TABLET    Take 1 tablet (5 mg total) by mouth every 8 (eight) hours as needed for nausea (if ondansetron (ZOFRAN) ineffective.).   NIACIN 500 MG TABLET    Take 500 mg by mouth every morning.    ONDANSETRON (ZOFRAN) 4 MG TABLET    Take 1 tablet (4 mg total) by mouth every 6 (six) hours as needed for nausea.   OXYCODONE (OXY IR/ROXICODONE) 5 MG IMMEDIATE RELEASE TABLET    Take 1-4 tablets (5-20 mg total) by mouth every 3 (three) hours as needed for moderate pain, severe pain or breakthrough pain.   PANTOPRAZOLE (PROTONIX) 40 MG TABLET    Take 40 mg by mouth every morning.    POLYETHYLENE GLYCOL (MIRALAX / GLYCOLAX) PACKET    Take 17 g by mouth daily as needed for mild constipation.   TRAMADOL (ULTRAM) 50 MG TABLET    Take 1-2 tablets (50-100 mg total) by mouth every 6 (six) hours as needed (mild to moderate pain).   WARFARIN (COUMADIN) 5 MG TABLET    Take 1 tablet (5 mg total) by mouth daily. Take Coumadin for four weeks and then discontinue.  The dose may need to be adjusted based upon the INR.  Please follow the INR and titrate Coumadin dose for a therapeutic range between 2.0 and 3.0 INR.  After completing the four weeks of Coumadin, the patient may stop the Coumadin and then take an 81 mg Aspirin daily for three more weeks.  Modified Medications   No medications on file  Discontinued Medications   AMLODIPINE-OLMESARTAN (AZOR) 10-40 MG PER TABLET    Take 1 tablet by mouth every morning.    ENOXAPARIN (LOVENOX) 30 MG/0.3ML INJECTION    Inject 0.3 mLs (30 mg total) into the skin every 12 (twelve) hours. Continue Lovenox injections until the INR is therapeutic at or greater than 2.0.  When INR reaches the therapeutic level of equal to or greater than 2.0, the patient may discontinue the Lovenox injections.     Physical Exam: Filed Vitals:   10/30/14 1309  BP: 129/78    Pulse: 85  Temp: 96.9 F (36.1 C)  Resp: 18    Physical Exam  Constitutional: She is oriented to person, place, and time. She appears well-developed and well-nourished. No distress.  HENT:  Head: Normocephalic and atraumatic.  Mouth/Throat: Oropharynx is clear and moist. No oropharyngeal exudate.  Eyes: Conjunctivae are normal. Pupils are equal, round, and reactive to light.  Neck: Normal range of motion. Neck supple.  Cardiovascular: Normal rate, regular rhythm and normal heart sounds.   Pulmonary/Chest: Effort normal and breath sounds normal.  Abdominal: Soft. Bowel sounds are normal.  Musculoskeletal: She exhibits no edema or tenderness.  Neurological: She is alert and oriented to person, place, and time.  Skin: Skin is warm and dry. She is not diaphoretic.  Surgical incisions to bilateral knees healing well  Psychiatric: She has a normal mood and affect.    Labs reviewed: Basic Metabolic Panel:  Recent Labs  10/02/14 1145 10/10/14 0420 10/11/14 0435  NA 142 135 138  K 3.6 4.4 4.2  CL 102 102 102  CO2 29 25 28   GLUCOSE 100* 139* 111*  BUN 23* 14 11  CREATININE 0.70 0.65 0.71  CALCIUM 9.5 8.6* 8.8*   Liver Function Tests:  Recent Labs  10/02/14 1145  AST 18  ALT 14  ALKPHOS 120  BILITOT 1.0  PROT 7.8  ALBUMIN 4.0   No results for input(s): LIPASE, AMYLASE in the last 8760 hours. No results for input(s): AMMONIA in the last 8760 hours. CBC:  Recent Labs  10/11/14 0435 10/12/14 0500 10/14/14 0515  WBC 11.5* 12.4* 10.4  HGB 10.1* 8.9* 8.5*  HCT 30.8* 27.7* 26.5*  MCV 89.3 87.7 87.5  PLT 240 221 257   TSH: No results for input(s): TSH in the last 8760 hours. A1C: No results found for: HGBA1C Lipid Panel: No results for input(s): CHOL, HDL, LDLCALC, TRIG, CHOLHDL, LDLDIRECT in the last 8760 hours.  10/23/14  WBC  8.9  4.0-10.5  K/uL    SLN  RBC  3.14  3.87-5.11  MIL/uL  L    Hemoglobin  8.6  12.0-15.0  g/dL  L    Hematocrit  27.1  36.0-46.0   %  L    MCV  86.3  78.0-100.0  fL      MCH  27.4  26.0-34.0  pg      MCHC  31.7  30.0-36.0  g/dL      RDW  14.1  11.5-15.5  %      Platelet Count  644  150-400  K/uL  H    MPV  8.6  8.6-12.4  fL  Assessment/Plan 1. Primary osteoarthritis of both knees S/p bilateral total knee arthroplasty.  Pain under control with current regimen of oxyIR 5 mg pt taking 1-3 tabs every 3 hour as needed for pain,  robaxin 500 mg q6 hour as needed for muscle spasm.  On coumadin for DVT prophylaxis, pt INR today 1.8, will cont coumadin 6mg  daily until 11/04/14 then to DC and start ASA 81 mg daily. - to follow up with ortho  2. S/P shoulder replacement, unspecified laterality -stable  3. Slow transit constipation Improved with current regimen  4. Essential hypertension Stable, continue azor 10-40 mg daily  5. Postoperative anemia due to acute blood loss Stable, will need ongoing outpatient follow up   6. DVT prophylaxis INR 1.8 today, pt with 6 more doses of coumadin, will cont 6 mg and to stop on 11/03/14 and start ASA 81 mg   pt is stable for discharge-will need PT/OT per home health. DME needed includes bariatric FWW and 3-1. Rx written.  will need to follow up with PCP within 2 weeks.     Carlos American. Harle Battiest  [facility] [phone] 8 am - 5 pm) [phone] (after hours)

## 2015-01-01 ENCOUNTER — Encounter (HOSPITAL_COMMUNITY): Payer: Self-pay | Admitting: Emergency Medicine

## 2015-01-01 ENCOUNTER — Inpatient Hospital Stay (HOSPITAL_COMMUNITY)
Admission: EM | Admit: 2015-01-01 | Discharge: 2015-01-03 | DRG: 872 | Disposition: A | Discharge status: Home / Self Care | Payer: Medicare HMO | Attending: Internal Medicine | Admitting: Internal Medicine

## 2015-01-01 ENCOUNTER — Emergency Department (HOSPITAL_COMMUNITY): Payer: Medicare HMO

## 2015-01-01 DIAGNOSIS — A084 Viral intestinal infection, unspecified: Secondary | ICD-10-CM | POA: Diagnosis present

## 2015-01-01 DIAGNOSIS — Z79891 Long term (current) use of opiate analgesic: Secondary | ICD-10-CM

## 2015-01-01 DIAGNOSIS — E876 Hypokalemia: Secondary | ICD-10-CM | POA: Diagnosis present

## 2015-01-01 DIAGNOSIS — I10 Essential (primary) hypertension: Secondary | ICD-10-CM | POA: Diagnosis present

## 2015-01-01 DIAGNOSIS — A419 Sepsis, unspecified organism: Principal | ICD-10-CM | POA: Diagnosis present

## 2015-01-01 DIAGNOSIS — Z96611 Presence of right artificial shoulder joint: Secondary | ICD-10-CM | POA: Diagnosis present

## 2015-01-01 DIAGNOSIS — M1990 Unspecified osteoarthritis, unspecified site: Secondary | ICD-10-CM | POA: Diagnosis present

## 2015-01-01 DIAGNOSIS — R1084 Generalized abdominal pain: Secondary | ICD-10-CM | POA: Diagnosis present

## 2015-01-01 DIAGNOSIS — Z8249 Family history of ischemic heart disease and other diseases of the circulatory system: Secondary | ICD-10-CM

## 2015-01-01 DIAGNOSIS — N390 Urinary tract infection, site not specified: Secondary | ICD-10-CM | POA: Diagnosis present

## 2015-01-01 DIAGNOSIS — K219 Gastro-esophageal reflux disease without esophagitis: Secondary | ICD-10-CM | POA: Diagnosis present

## 2015-01-01 DIAGNOSIS — R52 Pain, unspecified: Secondary | ICD-10-CM

## 2015-01-01 DIAGNOSIS — Z96653 Presence of artificial knee joint, bilateral: Secondary | ICD-10-CM | POA: Diagnosis present

## 2015-01-01 DIAGNOSIS — E785 Hyperlipidemia, unspecified: Secondary | ICD-10-CM | POA: Diagnosis present

## 2015-01-01 DIAGNOSIS — Z79899 Other long term (current) drug therapy: Secondary | ICD-10-CM | POA: Diagnosis not present

## 2015-01-01 DIAGNOSIS — R109 Unspecified abdominal pain: Secondary | ICD-10-CM | POA: Insufficient documentation

## 2015-01-01 DIAGNOSIS — Z96612 Presence of left artificial shoulder joint: Secondary | ICD-10-CM | POA: Diagnosis present

## 2015-01-01 DIAGNOSIS — R1030 Lower abdominal pain, unspecified: Secondary | ICD-10-CM | POA: Diagnosis not present

## 2015-01-01 DIAGNOSIS — M889 Osteitis deformans of unspecified bone: Secondary | ICD-10-CM | POA: Diagnosis present

## 2015-01-01 DIAGNOSIS — D72829 Elevated white blood cell count, unspecified: Secondary | ICD-10-CM | POA: Diagnosis present

## 2015-01-01 DIAGNOSIS — N179 Acute kidney failure, unspecified: Secondary | ICD-10-CM | POA: Diagnosis present

## 2015-01-01 LAB — URINALYSIS, ROUTINE W REFLEX MICROSCOPIC
BILIRUBIN URINE: NEGATIVE
GLUCOSE, UA: NEGATIVE mg/dL
Glucose, UA: NEGATIVE mg/dL
HGB URINE DIPSTICK: NEGATIVE
HGB URINE DIPSTICK: NEGATIVE
KETONES UR: NEGATIVE mg/dL
Ketones, ur: NEGATIVE mg/dL
Leukocytes, UA: NEGATIVE
Nitrite: POSITIVE — AB
Nitrite: NEGATIVE
PH: 6 (ref 5.0–8.0)
PH: 5.5 (ref 5.0–8.0)
Protein, ur: NEGATIVE mg/dL
Protein, ur: NEGATIVE mg/dL
SPECIFIC GRAVITY, URINE: 1.04 — AB (ref 1.005–1.030)
Specific Gravity, Urine: 1.036 — ABNORMAL HIGH (ref 1.005–1.030)
Urobilinogen, UA: 0.2 mg/dL (ref 0.0–1.0)
Urobilinogen, UA: 0.2 mg/dL (ref 0.0–1.0)

## 2015-01-01 LAB — CBC WITH DIFFERENTIAL/PLATELET
BASOS PCT: 0 % (ref 0–1)
Basophils Absolute: 0 10*3/uL (ref 0.0–0.1)
Eosinophils Absolute: 0.1 10*3/uL (ref 0.0–0.7)
Eosinophils Relative: 0 % (ref 0–5)
HCT: 35.7 % — ABNORMAL LOW (ref 36.0–46.0)
HEMOGLOBIN: 11.5 g/dL — AB (ref 12.0–15.0)
LYMPHS PCT: 12 % (ref 12–46)
Lymphs Abs: 1.7 10*3/uL (ref 0.7–4.0)
MCH: 26.9 pg (ref 26.0–34.0)
MCHC: 32.2 g/dL (ref 30.0–36.0)
MCV: 83.6 fL (ref 78.0–100.0)
MONOS PCT: 5 % (ref 3–12)
Monocytes Absolute: 0.7 10*3/uL (ref 0.1–1.0)
NEUTROS PCT: 83 % — AB (ref 43–77)
Neutro Abs: 11.4 10*3/uL — ABNORMAL HIGH (ref 1.7–7.7)
Platelets: 244 10*3/uL (ref 150–400)
RBC: 4.27 MIL/uL (ref 3.87–5.11)
RDW: 15.9 % — ABNORMAL HIGH (ref 11.5–15.5)
WBC: 13.8 10*3/uL — ABNORMAL HIGH (ref 4.0–10.5)

## 2015-01-01 LAB — COMPREHENSIVE METABOLIC PANEL
ALBUMIN: 3.8 g/dL (ref 3.5–5.0)
ALK PHOS: 117 U/L (ref 38–126)
ALT: 13 U/L — ABNORMAL LOW (ref 14–54)
AST: 19 U/L (ref 15–41)
Anion gap: 13 (ref 5–15)
BILIRUBIN TOTAL: 0.9 mg/dL (ref 0.3–1.2)
BUN: 21 mg/dL — AB (ref 6–20)
CALCIUM: 9.2 mg/dL (ref 8.9–10.3)
CO2: 22 mmol/L (ref 22–32)
Chloride: 102 mmol/L (ref 101–111)
Creatinine, Ser: 1.09 mg/dL — ABNORMAL HIGH (ref 0.44–1.00)
GFR calc Af Amer: 59 mL/min — ABNORMAL LOW (ref >60)
GFR, EST NON AFRICAN AMERICAN: 51 mL/min — AB (ref >60)
GLUCOSE: 184 mg/dL — AB (ref 65–99)
Potassium: 2.9 mmol/L — ABNORMAL LOW (ref 3.5–5.1)
Sodium: 137 mmol/L (ref 135–145)
TOTAL PROTEIN: 7.2 g/dL (ref 6.5–8.1)

## 2015-01-01 LAB — URINE MICROSCOPIC-ADD ON

## 2015-01-01 LAB — MAGNESIUM: MAGNESIUM: 2.3 mg/dL (ref 1.7–2.4)

## 2015-01-01 LAB — LIPASE, BLOOD: LIPASE: 15 U/L — AB (ref 22–51)

## 2015-01-01 LAB — I-STAT CG4 LACTIC ACID, ED: Lactic Acid, Venous: 1.88 mmol/L (ref 0.5–2.0)

## 2015-01-01 LAB — POC OCCULT BLOOD, ED: FECAL OCCULT BLD: NEGATIVE

## 2015-01-01 MED ORDER — CYCLOSPORINE 0.05 % OP EMUL
1.0000 [drp] | Freq: Every day | OPHTHALMIC | Status: DC | PRN
  Filled 2015-01-01: qty 1

## 2015-01-01 MED ORDER — IOHEXOL 300 MG/ML  SOLN
100.0000 mL | Freq: Once | INTRAMUSCULAR | Status: AC | PRN
  Administered 2015-01-01: 100 mL via INTRAVENOUS

## 2015-01-01 MED ORDER — ATORVASTATIN CALCIUM 10 MG PO TABS
10.0000 mg | ORAL_TABLET | Freq: Every morning | ORAL | Status: DC
  Administered 2015-01-01 – 2015-01-03 (×3): 10 mg via ORAL
  Filled 2015-01-01 (×3): qty 1

## 2015-01-01 MED ORDER — SODIUM CHLORIDE 0.9 % IV BOLUS (SEPSIS)
500.0000 mL | Freq: Once | INTRAVENOUS | Status: AC
  Administered 2015-01-01: 500 mL via INTRAVENOUS

## 2015-01-01 MED ORDER — POTASSIUM CHLORIDE 10 MEQ/100ML IV SOLN
10.0000 meq | INTRAVENOUS | Status: DC
Start: 2015-01-01 — End: 2015-01-01
  Filled 2015-01-01 (×2): qty 100

## 2015-01-01 MED ORDER — HYDROMORPHONE HCL 1 MG/ML IJ SOLN
0.5000 mg | INTRAMUSCULAR | Status: DC | PRN
  Administered 2015-01-01: 0.5 mg via INTRAVENOUS
  Filled 2015-01-01: qty 1

## 2015-01-01 MED ORDER — IRBESARTAN 300 MG PO TABS
300.0000 mg | ORAL_TABLET | Freq: Every day | ORAL | Status: DC
  Administered 2015-01-01 – 2015-01-03 (×3): 300 mg via ORAL
  Filled 2015-01-01 (×3): qty 1

## 2015-01-01 MED ORDER — SODIUM CHLORIDE 0.9 % IV SOLN: INTRAVENOUS | Status: DC

## 2015-01-01 MED ORDER — AMLODIPINE-OLMESARTAN 10-40 MG PO TABS: 1.0000 | ORAL_TABLET | Freq: Every day | ORAL | Status: DC

## 2015-01-01 MED ORDER — POTASSIUM CHLORIDE CRYS ER 20 MEQ PO TBCR
20.0000 meq | EXTENDED_RELEASE_TABLET | Freq: Two times a day (BID) | ORAL | Status: AC
Start: 2015-01-01 — End: 2015-01-01
  Administered 2015-01-01 (×2): 20 meq via ORAL
  Filled 2015-01-01 (×2): qty 1

## 2015-01-01 MED ORDER — ONDANSETRON HCL 4 MG/2ML IJ SOLN
4.0000 mg | Freq: Once | INTRAMUSCULAR | Status: AC
  Administered 2015-01-01: 4 mg via INTRAVENOUS
  Filled 2015-01-01: qty 2

## 2015-01-01 MED ORDER — ONDANSETRON HCL 4 MG/2ML IJ SOLN: 4.0000 mg | Freq: Four times a day (QID) | INTRAMUSCULAR | Status: DC | PRN

## 2015-01-01 MED ORDER — MORPHINE SULFATE (PF) 4 MG/ML IV SOLN
4.0000 mg | Freq: Once | INTRAVENOUS | Status: AC
  Administered 2015-01-01: 4 mg via INTRAVENOUS
  Filled 2015-01-01: qty 1

## 2015-01-01 MED ORDER — IOHEXOL 300 MG/ML  SOLN
50.0000 mL | Freq: Once | INTRAMUSCULAR | Status: AC | PRN
  Administered 2015-01-01: 50 mL via ORAL

## 2015-01-01 MED ORDER — POTASSIUM CHLORIDE 10 MEQ/100ML IV SOLN
10.0000 meq | INTRAVENOUS | Status: AC
Start: 2015-01-01 — End: 2015-01-01
  Administered 2015-01-01 (×3): 10 meq via INTRAVENOUS
  Filled 2015-01-01 (×3): qty 100

## 2015-01-01 MED ORDER — HYDROCODONE-ACETAMINOPHEN 5-325 MG PO TABS
1.0000 | ORAL_TABLET | ORAL | Status: DC | PRN
  Administered 2015-01-01 – 2015-01-03 (×2): 2 via ORAL
  Filled 2015-01-01 (×2): qty 2

## 2015-01-01 MED ORDER — PANTOPRAZOLE SODIUM 40 MG PO TBEC
40.0000 mg | DELAYED_RELEASE_TABLET | Freq: Every morning | ORAL | Status: DC
  Administered 2015-01-01 – 2015-01-03 (×3): 40 mg via ORAL
  Filled 2015-01-01 (×3): qty 1

## 2015-01-01 MED ORDER — ONDANSETRON HCL 4 MG PO TABS: 4.0000 mg | ORAL_TABLET | Freq: Four times a day (QID) | ORAL | Status: DC | PRN

## 2015-01-01 MED ORDER — DEXTROSE 5 % IV SOLN
1.0000 g | INTRAVENOUS | Status: DC
  Administered 2015-01-01 – 2015-01-03 (×3): 1 g via INTRAVENOUS
  Filled 2015-01-01 (×3): qty 10

## 2015-01-01 MED ORDER — AMLODIPINE BESYLATE 10 MG PO TABS
10.0000 mg | ORAL_TABLET | Freq: Every day | ORAL | Status: DC
  Administered 2015-01-01 – 2015-01-03 (×3): 10 mg via ORAL
  Filled 2015-01-01 (×3): qty 1

## 2015-01-01 NOTE — ED Provider Notes (Signed)
CSN: [id number]     Arrival date & time 01/01/15  0446 History   First MD Initiated Contact with Patient 01/01/15 [phone]     Chief Complaint  Patient presents with  . Abdominal Pain  . Emesis     (Consider location/radiation/quality/duration/timing/severity/associated sxs/prior Treatment) HPI She presents with generalized abdominal pain, distention, multiple episodes of vomiting starting around midnight this evening. Pain is become increasingly worse. She is passed no gas or had no bowel movements since the onset of symptoms. Last bowel movement was yesterday. Denies any fever or chills. No urinary symptoms. No previously similar symptoms to her current pain. Patient's had a previous abdominal hysterectomy and adrenal tumor removal. Past Medical History  Diagnosis Date  . Hypertension   . Arthritis   . PONV (postoperative nausea and vomiting)   . Frequent urination at night   . GERD (gastroesophageal reflux disease)   . Hyperlipemia    Past Surgical History  Procedure Laterality Date  . Abdominal hysterectomy    . Removal of the renal tumor    . Tonsillectomy    . Partial hysterectomy    . Banectomy    . Colonoscopy    . Breast enhancement surgery  2000  . Cardiac catheterization      no PCI; had before renal tumor was removed  . Revision total shoulder arthroplasty Right 03/08/2014    dr Veverly Fells  . Reverse shoulder arthroplasty Right 03/08/2014    Procedure: RIGHT TOTAL SHOULDER ARTHROPLASTY VERSES A REVERSE ARTHROPLASTY;  Surgeon: Augustin Schooling, MD;  Location: [place];  Service: Orthopedics;  Laterality: Right;  . Reverse shoulder arthroplasty Left 07/05/2014    Procedure: LEFT REVERSE SHOULDER ARTHROPLASTY;  Surgeon: Augustin Schooling, MD;  Location: [place];  Service: Orthopedics;  Laterality: Left;  . Total knee arthroplasty Bilateral 10/09/2014    Procedure: BILATERAL TOTAL KNEE ARTHROPLASTY;  Surgeon: Gaynelle Arabian, MD;  Location: [facility];  Service: Orthopedics;  Laterality:  Bilateral;  with general anesthesia   Family History  Problem Relation Age of Onset  . Hypertension Mother   . Hypertension Father   . Hypertension Sister   . Hypertension Brother   . Hypertension Other    Social History  Substance Use Topics  . Smoking status: Never Smoker   . Smokeless tobacco: Never Used  . Alcohol Use: No   OB History    No data available     Review of Systems  Constitutional: Negative for fever and chills.  Respiratory: Negative for shortness of breath.   Cardiovascular: Negative for chest pain.  Gastrointestinal: Positive for nausea, vomiting, abdominal pain, constipation and abdominal distention.  Genitourinary: Negative for dysuria, frequency, flank pain and difficulty urinating.  Musculoskeletal: Negative for back pain, neck pain and neck stiffness.  Skin: Negative for rash and wound.  Neurological: Negative for dizziness, weakness, light-headedness, numbness and headaches.  All other systems reviewed and are negative.     Allergies  Review of patient's allergies indicates no known allergies.  Home Medications   Prior to Admission medications   Medication Sig Start Date End Date Taking? Authorizing Provider  amLODipine-olmesartan (AZOR) 10-40 MG per tablet Take 1 tablet by mouth daily.   Yes Historical Provider, MD  atorvastatin (LIPITOR) 10 MG tablet Take 10 mg by mouth every morning.    Yes Historical Provider, MD  CycloSPORINE (RESTASIS OP) Place 1 drop into both eyes daily as needed (dry eyes).    Yes Historical Provider, MD  niacin 500 MG tablet Take 500  mg by mouth every morning.    Yes Historical Provider, MD  oxyCODONE (OXY IR/ROXICODONE) 5 MG immediate release tablet Take 1-4 tablets (5-20 mg total) by mouth every 3 (three) hours as needed for moderate pain, severe pain or breakthrough pain. 10/24/14  Yes Lauree Chandler, NP  pantoprazole (PROTONIX) 40 MG tablet Take 40 mg by mouth every morning.    Yes Historical Provider, MD   acetaminophen (TYLENOL) 325 MG tablet Take 2 tablets (650 mg total) by mouth every 6 (six) hours as needed for mild pain (or Fever >/= 101). Patient not taking: Reported on 01/01/2015 10/11/14   [name], PA-C  bisacodyl (DULCOLAX) 10 MG suppository Place 1 suppository (10 mg total) rectally daily as needed for moderate constipation. Patient not taking: Reported on 01/01/2015 10/11/14   [name], PA-C  docusate sodium (COLACE) 100 MG capsule Take 1 capsule (100 mg total) by mouth 2 (two) times daily. Patient not taking: Reported on 01/01/2015 10/11/14   [name], PA-C  methocarbamol (ROBAXIN) 500 MG tablet Take 1 tablet (500 mg total) by mouth every 6 (six) hours as needed for muscle spasms. Patient not taking: Reported on 01/01/2015 10/11/14   [name], PA-C  metoCLOPramide (REGLAN) 5 MG tablet Take 1 tablet (5 mg total) by mouth every 8 (eight) hours as needed for nausea (if ondansetron (ZOFRAN) ineffective.). Patient not taking: Reported on 01/01/2015 10/11/14   [name], PA-C  ondansetron (ZOFRAN) 4 MG tablet Take 1 tablet (4 mg total) by mouth every 6 (six) hours as needed for nausea. Patient not taking: Reported on 01/01/2015 10/11/14   [name], PA-C  polyethylene glycol [facility] Floria Raveling) packet Take 17 g by mouth daily as needed for mild constipation. Patient not taking: Reported on 01/01/2015 10/11/14   [name], PA-C  traMADol (ULTRAM) 50 MG tablet Take 1-2 tablets (50-100 mg total) by mouth every 6 (six) hours as needed (mild to moderate pain). Patient not taking: Reported on 01/01/2015 10/11/14   [name], PA-C  warfarin (COUMADIN) 5 MG tablet Take 1 tablet (5 mg total) by mouth daily. Take Coumadin for four weeks and then discontinue.  The dose may need to be adjusted based upon the INR.  Please follow the INR and titrate Coumadin dose for a therapeutic range between 2.0 and 3.0 INR.  After completing the four weeks of Coumadin, the patient may stop the Coumadin and then  take an 81 mg Aspirin daily for three more weeks. Patient not taking: Reported on 01/01/2015 10/11/14   [name], PA-C   BP 120/67 mmHg  Pulse 90  Temp(Src) 98.6 F (37 C) (Oral)  Resp 16  Ht 5\' 7"  (1.702 m)  Wt 247 lb (112.038 kg)  BMI 38.68 kg/m2  SpO2 97% Physical Exam  Constitutional: She is oriented to person, place, and time. She appears well-developed and well-nourished. No distress.  Appears uncomfortable  HENT:  Head: Normocephalic and atraumatic.  Mouth/Throat: Oropharynx is clear and moist.  Eyes: EOM are normal. Pupils are equal, round, and reactive to light.  Neck: Normal range of motion. Neck supple.  Cardiovascular: Normal rate and regular rhythm.   Pulmonary/Chest: Effort normal and breath sounds normal. No respiratory distress. She has no wheezes. She has no rales.  Abdominal: Soft. She exhibits distension. She exhibits no mass. There is tenderness (diffuse abdominal tenderness to palpation without focality, rebound or guarding.). There is no rebound and no guarding.  Decreased bowel sounds throughout  Musculoskeletal: Normal range of motion. She  exhibits no edema or tenderness.  Neurological: She is alert and oriented to person, place, and time.  Skin: Skin is warm and dry. No rash noted. No erythema.  Psychiatric: She has a normal mood and affect. Her behavior is normal.  Nursing note and vitals reviewed.   ED Course  Procedures (including critical care time) Labs Review Labs Reviewed  CBC WITH DIFFERENTIAL/PLATELET - Abnormal; Notable for the following:    WBC 13.8 (*)    Hemoglobin 11.5 (*)    HCT 35.7 (*)    RDW 15.9 (*)    Neutrophils Relative % 83 (*)    Neutro Abs 11.4 (*)    All other components within normal limits  COMPREHENSIVE METABOLIC PANEL - Abnormal; Notable for the following:    Potassium 2.9 (*)    Glucose, Bld 184 (*)    BUN 21 (*)    Creatinine, Ser 1.09 (*)    ALT 13 (*)    GFR calc non Af Amer 51 (*)    GFR calc Af Amer 59 (*)     All other components within normal limits  LIPASE, BLOOD - Abnormal; Notable for the following:    Lipase 15 (*)    All other components within normal limits  URINALYSIS, ROUTINE W REFLEX MICROSCOPIC (NOT AT [facility]) - Abnormal; Notable for the following:    Color, Urine AMBER (*)    APPearance CLOUDY (*)    Specific Gravity, Urine 1.040 (*)    Nitrite POSITIVE (*)    All other components within normal limits  URINE MICROSCOPIC-ADD ON - Abnormal; Notable for the following:    Bacteria, UA MANY (*)    All other components within normal limits  URINALYSIS, ROUTINE W REFLEX MICROSCOPIC (NOT AT [facility]) - Abnormal; Notable for the following:    Color, Urine AMBER (*)    Specific Gravity, Urine 1.036 (*)    Bilirubin Urine SMALL (*)    Leukocytes, UA SMALL (*)    All other components within normal limits  URINE MICROSCOPIC-ADD ON - Abnormal; Notable for the following:    Squamous Epithelial / LPF FEW (*)    All other components within normal limits  C DIFFICILE QUICK SCREEN W PCR REFLEX  URINE CULTURE  MAGNESIUM  BASIC METABOLIC PANEL  CBC  I-STAT CG4 LACTIC ACID, ED  POC OCCULT BLOOD, ED    Imaging Review Ct Abdomen Pelvis W Contrast  01/01/2015   CLINICAL DATA:  Right lower quadrant abdominal pain since last evening. Associated nausea, vomiting, elevated white blood cell count and elevated lipase.  EXAM: CT ABDOMEN AND PELVIS WITH CONTRAST  TECHNIQUE: Multidetector CT imaging of the abdomen and pelvis was performed using the standard protocol following bolus administration of intravenous contrast.  CONTRAST:  148mL OMNIPAQUE IOHEXOL 300 MG/ML  SOLN  COMPARISON:  None.  FINDINGS: Lower chest: The lung bases are clear of acute process. Minimal subpleural dependent atelectasis. No pleural effusion. The heart is mildly enlarged. There is a small to moderate-sized pericardial effusion. The distal esophagus is grossly normal. There is a small hiatal hernia.  Hepatobiliary: No focal hepatic  lesions or intrahepatic biliary dilatation. The gallbladder demonstrates at least 1 gallstone measuring 19 mm. No findings for acute cholecystitis. No common bile duct dilatation.  Pancreas: No mass, inflammation or ductal dilatation.  Spleen: Normal size.  No focal lesions.  Adrenals/Urinary Tract: There are surgical changes in the left suprarenal region likely from a previous adrenalectomy. The right adrenal gland is normal. Both kidneys are  normal. No renal mass or calculi. No hydronephrosis.  Stomach/Bowel: The stomach, duodenum, small bowel and colon are unremarkable. No inflammatory changes, mass lesions or obstructive findings. Moderate sigmoid diverticulosis without findings for acute diverticulitis. Moderate stool throughout the colon.  Vascular/Lymphatic: No mesenteric or retroperitoneal mass or adenopathy. The aorta and branch vessels are patent. Scattered aortoiliac calcifications.  Other: The uterus is surgically absent. Both ovaries are still present and appear normal. No pelvic mass or adenopathy. No free pelvic fluid collections. No inguinal mass or adenopathy. The bladder is normal.  Musculoskeletal: The T10 vertebral body is abnormal. There is a mixed lytic and sclerotic process. I do not see any obvious cortical thickening but Paget's disease is a possibility. Areas of "cotton wool" appearance but could also suggest fibrous dysplasia. In retrospect I think this was probably present on prior lateral chest films. I do not think this is a malignant process but I would recommend an MRI of the thoracic spine for further evaluation and better characterization.  IMPRESSION: 1. No acute abdominal/pelvic findings, mass lesions or lymphadenopathy. No CT findings to suggest acute pancreatitis. 2. Cholelithiasis but no CT findings for acute cholecystitis. 3. Surgical changes from a previous left adrenalectomy. 4. Diverticulosis of the sigmoid colon without findings for acute diverticulitis. 5. 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 MRI thoracic spine without and with contrast suggested for further evaluation to exclude a malignant process.   Electronically Signed   By: Marijo Sanes M.D.   On: 01/01/2015 09:13   I have personally reviewed and evaluated these images and lab results as part of my medical decision-making.   EKG Interpretation   Date/Time:  Wednesday January 01 2015 05:15:26 EDT Ventricular Rate:  73 PR Interval:  154 QRS Duration: 147 QT Interval:  452 QTC Calculation: 498 R Axis:   -4 Text Interpretation:  Sinus rhythm Left atrial enlargement Left bundle  branch block Confirmed by Lyell Clugston  MD, Haile Bosler ([id number]) on 01/01/2015  5:22:00 AM      MDM   Final diagnoses:  Generalized abdominal pain  Hypokalemia    Patient signed out to oncoming emergency physician pending CT of the abdomen and pelvis.    Julianne Rice, MD 01/02/15 [id number]

## 2015-01-01 NOTE — ED Notes (Signed)
Attempted to call report to nurse. Secretary stated he was at lunch and will return in 5 min. Will attempt to call report then.

## 2015-01-01 NOTE — H&P (Addendum)
[facility] History and Physical  Tara Moreno SLH:[id number] DOB: 1945/05/27 DOA: 01/01/2015  Referring physician: ED physician, Dr. Georgina Peer PCP: Patricia Nettle, MD   Chief Complaint: Abdominal pain  HPI:  Patient is 69 year old female with hypertension and hyperlipidemia, recent hospitalization from 10/09/14 - 10/15/2014 for bilateral knee arthroplasty, now presenting to [facility] emergency department with main concern of several days duration of progressively worsening abdominal pain, mostly located in bilateral lower quadrants, initially constant and sharp but currently intermittent in nature and throbbing, occasionally but not consistently radiating to entire abdomen, 10/10 in severity when present, associated with nausea and poor oral intake, cramps, no specific alleviating factors. Patient denies any diarrhea, no fevers or chills, no similar events in the past. Patient also denies chest pain or shortness of breath.  In emergency department, patient noted to be in mild distress due to abdominal pain, vital signs notable for heart rate up to 100, respiratory rate up to 27. Blood work notable for WBC 13.8, K 2.9, Cr 1.09. CT abdomen with no specific acute finding to explain source of pain. [facility] asked to admit for further evaluation to medical bed.  Assessment and Plan:  Principal Problem:   Generalized abdominal pain - Unclear etiology - CT abdomen with no specific findings to explain patient's pain - We will admit to medical bed, provide analgesia as needed, antiemetics as needed - We'll consult GI for assistance Active Problems:   Leukocytosis - With HR up to 100 and WBC 13.8, patient meets criteria for SIRS/early sepsis - UA with nitrates present but otherwise fairly unremarkable - We'll place on empiric Rocephin for now and follow up on urine cultures   Hypokalemia - Will supplement, check Mg level - Repeat BMP in the morning   Acute renal failure - Prerenal in  etiology, we'll provide IV fluids and repeat BMP in the morning   HTN - continue home regimen    ? Lytic and sclerotic process in the T10 vertebral body  - ? fibrous dysplasia or Paget's disease - MRI thoracic spine without and with contrast suggested for further evaluation to exclude a malignant process - will place order once pt more comfortable and able to lay down still and once Cr is WNL (as contrast administration is recommended)    DVT prophylaxis - Lovenox SQ  Radiological Exams on Admission: Ct Abdomen Pelvis W Contrast 01/01/2015  No acute abdominal/pelvic findings, mass lesions or lymphadenopathy. No CT findings to suggest acute pancreatitis. 2. Cholelithiasis but no CT findings for acute cholecystitis. 3. Surgical changes from a previous left adrenalectomy. 4. Diverticulosis of the sigmoid colon without findings for acute diverticulitis. 5. 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 MRI thoracic spine without and with contrast suggested for further evaluation to exclude a malignant process.    Code Status: Full Family Communication: Pt and family at bedside Disposition Plan: Admit for further evaluation    Mart Piggs [facility] [phone]   Review of Systems:  Constitutional: Negative for diaphoresis.  HENT: Negative for hearing loss, ear pain, nosebleeds, congestion, sore throat, neck pain, tinnitus and ear discharge.   Eyes: Negative for blurred vision, double vision, photophobia, pain, discharge and redness.  Respiratory: Negative for cough, hemoptysis, sputum production, shortness of breath, wheezing and stridor.   Cardiovascular: Negative for chest pain, palpitations, orthopnea, claudication and leg swelling.  Gastrointestinal: Per HPI Genitourinary: Negative for urgency, frequency, hematuria and flank pain.  Musculoskeletal: Negative  for myalgias, back pains.  Skin: Negative for itching and rash.   Neurological: Negative for dizziness and weakness.  Endo/Heme/Allergies: Negative for environmental allergies and polydipsia. Does not bruise/bleed easily.  Psychiatric/Behavioral: Negative for suicidal ideas. The patient is not nervous/anxious.      Past Medical History  Diagnosis Date  . Hypertension   . Arthritis   . PONV (postoperative nausea and vomiting)   . Frequent urination at night   . GERD (gastroesophageal reflux disease)   . Hyperlipemia     Past Surgical History  Procedure Laterality Date  . Abdominal hysterectomy    . Removal of the renal tumor    . Tonsillectomy    . Partial hysterectomy    . Banectomy    . Colonoscopy    . Breast enhancement surgery  2000  . Cardiac catheterization      no PCI; had before renal tumor was removed  . Revision total shoulder arthroplasty Right 03/08/2014    dr Veverly Fells  . Reverse shoulder arthroplasty Right 03/08/2014    Procedure: RIGHT TOTAL SHOULDER ARTHROPLASTY VERSES A REVERSE ARTHROPLASTY;  Surgeon: Augustin Schooling, MD;  Location: [place];  Service: Orthopedics;  Laterality: Right;  . Reverse shoulder arthroplasty Left 07/05/2014    Procedure: LEFT REVERSE SHOULDER ARTHROPLASTY;  Surgeon: Augustin Schooling, MD;  Location: [place];  Service: Orthopedics;  Laterality: Left;  . Total knee arthroplasty Bilateral 10/09/2014    Procedure: BILATERAL TOTAL KNEE ARTHROPLASTY;  Surgeon: Gaynelle Arabian, MD;  Location: [facility];  Service: Orthopedics;  Laterality: Bilateral;  with general anesthesia    Social History:  reports that she has never smoked. She has never used smokeless tobacco. She reports that she does not drink alcohol or use illicit drugs.  No Known Allergies  Family History  Problem Relation Age of Onset  . Hypertension Mother   . Hypertension Father   . Hypertension Sister   . Hypertension Brother   . Hypertension Other     Medication Sig  amLODipine-olmesartan (AZOR) 10-40 MG per tablet Take 1 tablet by mouth daily.   atorvastatin (LIPITOR) 10 MG tablet Take 10 mg by mouth every morning.   CycloSPORINE (RESTASIS OP) Place 1 drop into both eyes daily as needed (dry eyes).   niacin 500 MG tablet Take 500 mg by mouth every morning.   oxyCODONE (OXY IR/ROXICODONE) 5 MG immediate release tablet Take 1-4 tablets (5-20 mg total) by mouth every 3 (three) hours as needed for moderate pain, severe pain or breakthrough pain.  pantoprazole (PROTONIX) 40 MG tablet Take 40 mg by mouth every morning.   traMADol (ULTRAM) 50 MG tablet Take 1-2 tablets (50-100 mg total) by mouth every 6 (six) hours as needed (mild to moderate pain). Patient not taking: Reported on 01/01/2015    Physical Exam: Filed Vitals:   01/01/15 0800 01/01/15 1038 01/01/15 1039 01/01/15 1110  BP: 91/71 169/85  155/81  Pulse: 62 100 100 90  Temp:      TempSrc:      Resp: 27 20 21 20   Height:      Weight:      SpO2: 100% 98% 98% 95%    Physical Exam  Constitutional: Appears well-developed and well-nourished. No distress.  HENT: Normocephalic. External right and left ear normal. Dry MM Eyes: Conjunctivae and EOM are normal. PERRLA, no scleral icterus.  Neck: Normal ROM. Neck supple. No JVD. No tracheal deviation. No thyromegaly.  CVS: RRR, S1/S2 +, no murmurs, no gallops, no carotid bruit.  Pulmonary: Effort and breath sounds normal, no stridor, rhonchi, wheezes, rales.  Abdominal: Soft. BS +,  no distension, tenderness in lower abd quadrants and epigastric area Musculoskeletal: Normal range of motion. No edema and no tenderness.  Lymphadenopathy: No lymphadenopathy noted, cervical, inguinal. Neuro: Alert. Normal reflexes, muscle tone coordination. No cranial nerve deficit. Skin: Skin is warm and dry. No rash noted. Not diaphoretic. No erythema. No pallor.  Psychiatric: Normal mood and affect. Behavior, judgment, thought content normal.   Labs on Admission:  Basic Metabolic Panel:  Recent Labs Lab 01/01/15 0627  NA 137  K 2.9*  CL 102   CO2 22  GLUCOSE 184*  BUN 21*  CREATININE 1.09*  CALCIUM 9.2   Liver Function Tests:  Recent Labs Lab 01/01/15 0627  AST 19  ALT 13*  ALKPHOS 117  BILITOT 0.9  PROT 7.2  ALBUMIN 3.8    Recent Labs Lab 01/01/15 0627  LIPASE 15*   CBC:  Recent Labs Lab 01/01/15 0627  WBC 13.8*  NEUTROABS 11.4*  HGB 11.5*  HCT 35.7*  MCV 83.6  PLT 244   EKG: pending    If 7PM-7AM, please contact night-coverage [email or web address] Password [facility] 01/01/2015, 11:37 AM

## 2015-01-01 NOTE — ED Notes (Signed)
Patient is from home and transported to facility via [facility]. Pt is complaining of right lower quad pain that started around midnight. EMS reports she has a underlying constant pain but has a intermittent cramping pain that cause nausea and vomiting. Pt's last BM was yesterday with being chalky, hard. Pt took an Oxycodone last night.

## 2015-01-01 NOTE — ED Notes (Signed)
Bed: GB02 Expected date:  Expected time:  Means of arrival:  Comments: 63div/34F abd pain/cramping

## 2015-01-01 NOTE — ED Provider Notes (Signed)
Patient having persistent abdominal pain and nausea. Rectal exam shows a small amount of stool but no masses. No obvious blood. Discussed with Dr. Doyle Askew.  Admit to general medicine.  Nat Christen, MD 01/01/15 1136

## 2015-01-01 NOTE — ED Notes (Signed)
Pt transported to [place] with technician.

## 2015-01-02 LAB — CBC
HCT: 36.5 % (ref 36.0–46.0)
Hemoglobin: 11.7 g/dL — ABNORMAL LOW (ref 12.0–15.0)
MCH: 27 pg (ref 26.0–34.0)
MCHC: 32.1 g/dL (ref 30.0–36.0)
MCV: 84.1 fL (ref 78.0–100.0)
PLATELETS: 288 10*3/uL (ref 150–400)
RBC: 4.34 MIL/uL (ref 3.87–5.11)
RDW: 16.4 % — AB (ref 11.5–15.5)
WBC: 8 10*3/uL (ref 4.0–10.5)

## 2015-01-02 LAB — C DIFFICILE QUICK SCREEN W PCR REFLEX
C DIFFICILE (CDIFF) INTERP: NEGATIVE
C DIFFICILE (CDIFF) TOXIN: NEGATIVE
C Diff antigen: NEGATIVE

## 2015-01-02 LAB — BASIC METABOLIC PANEL
Anion gap: 6 (ref 5–15)
BUN: 13 mg/dL (ref 6–20)
CHLORIDE: 107 mmol/L (ref 101–111)
CO2: 24 mmol/L (ref 22–32)
CREATININE: 0.78 mg/dL (ref 0.44–1.00)
Calcium: 8.9 mg/dL (ref 8.9–10.3)
GFR calc Af Amer: >60 mL/min (ref >60)
GFR calc non Af Amer: >60 mL/min (ref >60)
GLUCOSE: 107 mg/dL — AB (ref 65–99)
Potassium: 3.9 mmol/L (ref 3.5–5.1)
Sodium: 137 mmol/L (ref 135–145)

## 2015-01-02 NOTE — Consult Note (Signed)
[facility] Consult Note  Referring Provider: No ref. provider found Primary Care Physician:  Patricia Nettle, MD Primary Gastroenterologist:  Dr.  Laurel Dimmer Complaint: lower abdominal pain, nausea vomiting and diarrhea HPI: Tara Moreno is an 69 y.o. black  female  Who presented afterdeveloping lower abdominal cramps, followed by a sensation of obstipation, sweatiness and then nausea and vomiting which progressively got worse through the night. When she got to the hospital she finally was able to have a bowel movement with one small solid stool followed by small to moderate volumes of nonbloody diarrhea. She continued to have significant abdominal pain and diarrheal stools about every 2 hours through most of yesterday and the abdominal pain finally began to diminish. It is described as bilateral and intermittent in both lower quadrants and suprapubic area. She is no longer nauseated and had her diet advanced to regular this morning. An abdominal CT scan showed diverticulosis with no evidence of diverticulitis, gallstones and a small lytic lesion in the thoracic spine which is to be worked up with an MRI. She had a C. Difficile toxin which was negative. She denies any recent travel or sick contacts. She has not noted any blood in her stools. She had a colonoscopy 10 years ago and was contacted this year about a repeat study but it got delayed due to knee surgery that she had in June.  Past Medical History  Diagnosis Date  . Hypertension   . Arthritis   . PONV (postoperative nausea and vomiting)   . Frequent urination at night   . GERD (gastroesophageal reflux disease)   . Hyperlipemia     Past Surgical History  Procedure Laterality Date  . Abdominal hysterectomy    . Removal of the renal tumor    . Tonsillectomy    . Partial hysterectomy    . Banectomy    . Colonoscopy    . Breast enhancement surgery  2000  . Cardiac catheterization      no PCI; had before renal tumor was  removed  . Revision total shoulder arthroplasty Right 03/08/2014    dr Veverly Fells  . Reverse shoulder arthroplasty Right 03/08/2014    Procedure: RIGHT TOTAL SHOULDER ARTHROPLASTY VERSES A REVERSE ARTHROPLASTY;  Surgeon: Augustin Schooling, MD;  Location: [place];  Service: Orthopedics;  Laterality: Right;  . Reverse shoulder arthroplasty Left 07/05/2014    Procedure: LEFT REVERSE SHOULDER ARTHROPLASTY;  Surgeon: Augustin Schooling, MD;  Location: [place];  Service: Orthopedics;  Laterality: Left;  . Total knee arthroplasty Bilateral 10/09/2014    Procedure: BILATERAL TOTAL KNEE ARTHROPLASTY;  Surgeon: Gaynelle Arabian, MD;  Location: [facility];  Service: Orthopedics;  Laterality: Bilateral;  with general anesthesia    Medications Prior to Admission  Medication Sig Dispense Refill  . amLODipine-olmesartan (AZOR) 10-40 MG per tablet Take 1 tablet by mouth daily.    Marland Kitchen atorvastatin (LIPITOR) 10 MG tablet Take 10 mg by mouth every morning.     . CycloSPORINE (RESTASIS OP) Place 1 drop into both eyes daily as needed (dry eyes).     . niacin 500 MG tablet Take 500 mg by mouth every morning.     Marland Kitchen oxyCODONE (OXY IR/ROXICODONE) 5 MG immediate release tablet Take 1-4 tablets (5-20 mg total) by mouth every 3 (three) hours as needed for moderate pain, severe pain or breakthrough pain. 120 tablet 0  . pantoprazole (PROTONIX) 40 MG tablet Take 40 mg by mouth every morning.     Marland Kitchen acetaminophen (TYLENOL) 325 MG  tablet Take 2 tablets (650 mg total) by mouth every 6 (six) hours as needed for mild pain (or Fever >/= 101). (Patient not taking: Reported on 01/01/2015) 40 tablet 0  . bisacodyl (DULCOLAX) 10 MG suppository Place 1 suppository (10 mg total) rectally daily as needed for moderate constipation. (Patient not taking: Reported on 01/01/2015) 12 suppository 0  . docusate sodium (COLACE) 100 MG capsule Take 1 capsule (100 mg total) by mouth 2 (two) times daily. (Patient not taking: Reported on 01/01/2015) 60 capsule 0  .  methocarbamol (ROBAXIN) 500 MG tablet Take 1 tablet (500 mg total) by mouth every 6 (six) hours as needed for muscle spasms. (Patient not taking: Reported on 01/01/2015) 80 tablet 0  . metoCLOPramide (REGLAN) 5 MG tablet Take 1 tablet (5 mg total) by mouth every 8 (eight) hours as needed for nausea (if ondansetron (ZOFRAN) ineffective.). (Patient not taking: Reported on 01/01/2015) 40 tablet 0  . ondansetron (ZOFRAN) 4 MG tablet Take 1 tablet (4 mg total) by mouth every 6 (six) hours as needed for nausea. (Patient not taking: Reported on 01/01/2015) 40 tablet 0  . polyethylene glycol (MIRALAX / GLYCOLAX) packet Take 17 g by mouth daily as needed for mild constipation. (Patient not taking: Reported on 01/01/2015) 14 each 0  . traMADol (ULTRAM) 50 MG tablet Take 1-2 tablets (50-100 mg total) by mouth every 6 (six) hours as needed (mild to moderate pain). (Patient not taking: Reported on 01/01/2015) 80 tablet 1  . warfarin (COUMADIN) 5 MG tablet Take 1 tablet (5 mg total) by mouth daily. Take Coumadin for four weeks and then discontinue.  The dose may need to be adjusted based upon the INR.  Please follow the INR and titrate Coumadin dose for a therapeutic range between 2.0 and 3.0 INR.  After completing the four weeks of Coumadin, the patient may stop the Coumadin and then take an 81 mg Aspirin daily for three more weeks. (Patient not taking: Reported on 01/01/2015) 45 tablet 0    Allergies: No Known Allergies  Family History  Problem Relation Age of Onset  . Hypertension Mother   . Hypertension Father   . Hypertension Sister   . Hypertension Brother   . Hypertension Other     Social History:  reports that she has never smoked. She has never used smokeless tobacco. She reports that she does not drink alcohol or use illicit drugs.  Review of Systems: negative except as above   Blood pressure 112/63, pulse 78, temperature 99.2 F (37.3 C), temperature source Oral, resp. rate 17, height $RemoveBe'5\' 7"'QhXcHAufN$  (1.702  m), weight 107.729 kg (237 lb 8 oz), SpO2 93 %. Head: Normocephalic, without obvious abnormality, atraumatic Neck: no adenopathy, no carotid bruit, no JVD, supple, symmetrical, trachea midline and thyroid not enlarged, symmetric, no tenderness/mass/nodules Resp: clear to auscultation bilaterally Cardio: regular rate and rhythm, S1, S2 normal, no murmur, click, rub or gallop GI: abdomen soft distended with normoactive bowel sounds. No hepatosplenomegaly mass or guarding there is mild tenderness without guarding in both lower quadrants. Extremities: extremities normal, atraumatic, no cyanosis or edema  Results for orders placed or performed during the hospital encounter of 01/01/15 (from the past 48 hour(s))  CBC with Differential/Platelet     Status: Abnormal   Collection Time: 01/01/15  6:27 AM  Result Value Ref Range   WBC 13.8 (H) 4.0 - 10.5 K/uL   RBC 4.27 3.87 - 5.11 MIL/uL   Hemoglobin 11.5 (L) 12.0 - 15.0 g/dL   HCT  35.7 (L) 36.0 - 46.0 %   MCV 83.6 78.0 - 100.0 fL   MCH 26.9 26.0 - 34.0 pg   MCHC 32.2 30.0 - 36.0 g/dL   RDW 15.9 (H) 11.5 - 15.5 %   Platelets 244 150 - 400 K/uL   Neutrophils Relative % 83 (H) 43 - 77 %   Neutro Abs 11.4 (H) 1.7 - 7.7 K/uL   Lymphocytes Relative 12 12 - 46 %   Lymphs Abs 1.7 0.7 - 4.0 K/uL   Monocytes Relative 5 3 - 12 %   Monocytes Absolute 0.7 0.1 - 1.0 K/uL   Eosinophils Relative 0 0 - 5 %   Eosinophils Absolute 0.1 0.0 - 0.7 K/uL   Basophils Relative 0 0 - 1 %   Basophils Absolute 0.0 0.0 - 0.1 K/uL  Comprehensive metabolic panel     Status: Abnormal   Collection Time: 01/01/15  6:27 AM  Result Value Ref Range   Sodium 137 135 - 145 mmol/L   Potassium 2.9 (L) 3.5 - 5.1 mmol/L   Chloride 102 101 - 111 mmol/L   CO2 22 22 - 32 mmol/L   Glucose, Bld 184 (H) 65 - 99 mg/dL   BUN 21 (H) 6 - 20 mg/dL   Creatinine, Ser 1.09 (H) 0.44 - 1.00 mg/dL   Calcium 9.2 8.9 - 10.3 mg/dL   Total Protein 7.2 6.5 - 8.1 g/dL   Albumin 3.8 3.5 - 5.0 g/dL    AST 19 15 - 41 U/L   ALT 13 (L) 14 - 54 U/L   Alkaline Phosphatase 117 38 - 126 U/L   Total Bilirubin 0.9 0.3 - 1.2 mg/dL   GFR calc non Af Amer 51 (L) >60 mL/min   GFR calc Af Amer 59 (L) >60 mL/min    Comment: (NOTE) The eGFR has been calculated using the CKD EPI equation. This calculation has not been validated in all clinical situations. eGFR's persistently <60 mL/min signify possible Chronic Kidney Disease.    Anion gap 13 5 - 15  Lipase, blood     Status: Abnormal   Collection Time: 01/01/15  6:27 AM  Result Value Ref Range   Lipase 15 (L) 22 - 51 U/L  Magnesium     Status: None   Collection Time: 01/01/15  6:33 AM  Result Value Ref Range   Magnesium 2.3 1.7 - 2.4 mg/dL  I-Stat CG4 Lactic Acid, ED     Status: None   Collection Time: 01/01/15  6:40 AM  Result Value Ref Range   Lactic Acid, Venous 1.88 0.5 - 2.0 mmol/L  Urinalysis, Routine w reflex microscopic (not at [facility])     Status: Abnormal   Collection Time: 01/01/15 10:40 AM  Result Value Ref Range   Color, Urine AMBER (A) YELLOW    Comment: BIOCHEMICALS MAY BE AFFECTED BY COLOR   APPearance CLOUDY (A) CLEAR   Specific Gravity, Urine 1.040 (H) 1.005 - 1.030   pH 6.0 5.0 - 8.0   Glucose, UA NEGATIVE NEGATIVE mg/dL   Hgb urine dipstick NEGATIVE NEGATIVE   Bilirubin Urine NEGATIVE NEGATIVE   Ketones, ur NEGATIVE NEGATIVE mg/dL   Protein, ur NEGATIVE NEGATIVE mg/dL   Urobilinogen, UA 0.2 0.0 - 1.0 mg/dL   Nitrite POSITIVE (A) NEGATIVE   Leukocytes, UA NEGATIVE NEGATIVE  Urine microscopic-add on     Status: Abnormal   Collection Time: 01/01/15 10:40 AM  Result Value Ref Range   Squamous Epithelial / LPF RARE RARE   WBC,  UA 0-2 <3 WBC/hpf   Bacteria, UA MANY (A) RARE  POC occult blood, ED Provider will collect     Status: None   Collection Time: 01/01/15 11:24 AM  Result Value Ref Range   Fecal Occult Bld NEGATIVE NEGATIVE  Urinalysis, Routine w reflex microscopic (not at [facility])     Status: Abnormal    Collection Time: 01/01/15  9:45 PM  Result Value Ref Range   Color, Urine AMBER (A) YELLOW    Comment: BIOCHEMICALS MAY BE AFFECTED BY COLOR   APPearance CLEAR CLEAR   Specific Gravity, Urine 1.036 (H) 1.005 - 1.030   pH 5.5 5.0 - 8.0   Glucose, UA NEGATIVE NEGATIVE mg/dL   Hgb urine dipstick NEGATIVE NEGATIVE   Bilirubin Urine SMALL (A) NEGATIVE   Ketones, ur NEGATIVE NEGATIVE mg/dL   Protein, ur NEGATIVE NEGATIVE mg/dL   Urobilinogen, UA 0.2 0.0 - 1.0 mg/dL   Nitrite NEGATIVE NEGATIVE   Leukocytes, UA SMALL (A) NEGATIVE  Urine microscopic-add on     Status: Abnormal   Collection Time: 01/01/15  9:45 PM  Result Value Ref Range   Squamous Epithelial / LPF FEW (A) RARE   WBC, UA 0-2 <3 WBC/hpf  C difficile quick scan w PCR reflex     Status: None   Collection Time: 01/02/15  1:51 AM  Result Value Ref Range   C Diff antigen NEGATIVE NEGATIVE   C Diff toxin NEGATIVE NEGATIVE   C Diff interpretation Negative for toxigenic C. difficile   Basic metabolic panel     Status: Abnormal   Collection Time: 01/02/15  5:35 AM  Result Value Ref Range   Sodium 137 135 - 145 mmol/L   Potassium 3.9 3.5 - 5.1 mmol/L    Comment: RESULT REPEATED AND VERIFIED DELTA CHECK NOTED NO VISIBLE HEMOLYSIS    Chloride 107 101 - 111 mmol/L   CO2 24 22 - 32 mmol/L   Glucose, Bld 107 (H) 65 - 99 mg/dL   BUN 13 6 - 20 mg/dL   Creatinine, Ser 0.78 0.44 - 1.00 mg/dL   Calcium 8.9 8.9 - 10.3 mg/dL   GFR calc non Af Amer >60 >60 mL/min   GFR calc Af Amer >60 >60 mL/min    Comment: (NOTE) The eGFR has been calculated using the CKD EPI equation. This calculation has not been validated in all clinical situations. eGFR's persistently <60 mL/min signify possible Chronic Kidney Disease.    Anion gap 6 5 - 15  CBC     Status: Abnormal   Collection Time: 01/02/15  5:35 AM  Result Value Ref Range   WBC 8.0 4.0 - 10.5 K/uL   RBC 4.34 3.87 - 5.11 MIL/uL   Hemoglobin 11.7 (L) 12.0 - 15.0 g/dL   HCT 36.5 36.0 -  46.0 %   MCV 84.1 78.0 - 100.0 fL   MCH 27.0 26.0 - 34.0 pg   MCHC 32.1 30.0 - 36.0 g/dL   RDW 16.4 (H) 11.5 - 15.5 %   Platelets 288 150 - 400 K/uL   Ct Abdomen Pelvis W Contrast  01/01/2015   CLINICAL DATA:  Right lower quadrant abdominal pain since last evening. Associated nausea, vomiting, elevated white blood cell count and elevated lipase.  EXAM: CT ABDOMEN AND PELVIS WITH CONTRAST  TECHNIQUE: Multidetector CT imaging of the abdomen and pelvis was performed using the standard protocol following bolus administration of intravenous contrast.  CONTRAST:  174m OMNIPAQUE IOHEXOL 300 MG/ML  SOLN  COMPARISON:  None.  FINDINGS: Lower chest: The lung bases are clear of acute process. Minimal subpleural dependent atelectasis. No pleural effusion. The heart is mildly enlarged. There is a small to moderate-sized pericardial effusion. The distal esophagus is grossly normal. There is a small hiatal hernia.  Hepatobiliary: No focal hepatic lesions or intrahepatic biliary dilatation. The gallbladder demonstrates at least 1 gallstone measuring 19 mm. No findings for acute cholecystitis. No common bile duct dilatation.  Pancreas: No mass, inflammation or ductal dilatation.  Spleen: Normal size.  No focal lesions.  Adrenals/Urinary Tract: There are surgical changes in the left suprarenal region likely from a previous adrenalectomy. The right adrenal gland is normal. Both kidneys are normal. No renal mass or calculi. No hydronephrosis.  Stomach/Bowel: The stomach, duodenum, small bowel and colon are unremarkable. No inflammatory changes, mass lesions or obstructive findings. Moderate sigmoid diverticulosis without findings for acute diverticulitis. Moderate stool throughout the colon.  Vascular/Lymphatic: No mesenteric or retroperitoneal mass or adenopathy. The aorta and branch vessels are patent. Scattered aortoiliac calcifications.  Other: The uterus is surgically absent. Both ovaries are still present and appear  normal. No pelvic mass or adenopathy. No free pelvic fluid collections. No inguinal mass or adenopathy. The bladder is normal.  Musculoskeletal: The T10 vertebral body is abnormal. There is a mixed lytic and sclerotic process. I do not see any obvious cortical thickening but Paget's disease is a possibility. Areas of "cotton wool" appearance but could also suggest fibrous dysplasia. In retrospect I think this was probably present on prior lateral chest films. I do not think this is a malignant process but I would recommend an MRI of the thoracic spine for further evaluation and better characterization.  IMPRESSION: 1. No acute abdominal/pelvic findings, mass lesions or lymphadenopathy. No CT findings to suggest acute pancreatitis. 2. Cholelithiasis but no CT findings for acute cholecystitis. 3. Surgical changes from a previous left adrenalectomy. 4. Diverticulosis of the sigmoid colon without findings for acute diverticulitis. 5. 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 MRI thoracic spine without and with contrast suggested for further evaluation to exclude a malignant process.   Electronically Signed   By: Marijo Sanes M.D.   On: 01/01/2015 09:13    Assessment: 1. Onset of lower abdominal pain diarrhea and nausea and vomiting symptoms somewhat improved, overall most suggestive of a viral or possibly bacterial gastroenteritis. Plan:  1. would simply obtain GI pathogens panel in treat supportively for now.  She will need colonoscopy eventually but not indicated acutely. Will follow with you. Georgeana Oertel C 01/02/2015, 11:59 AM  Pager [phone] If no answer or after 5 PM call [phone]

## 2015-01-02 NOTE — Progress Notes (Signed)
Patient ID: Tara Moreno, female   DOB: Jul 03, 1945, 69 y.o.   MRN: [id number]  [facility] PROGRESS NOTE  Tara Moreno VEH:[id number] DOB: 1946-04-25 DOA: 01/01/2015 PCP: Patricia Nettle, MD   Brief narrative:    Patient is 69 year old female with hypertension and hyperlipidemia, recent hospitalization from 10/09/14 - 10/15/2014 for bilateral knee arthroplasty, now presenting to [facility] emergency department with main concern of several days duration of progressively worsening abdominal pain, mostly located in bilateral lower quadrants, initially constant and sharp but currently intermittent in nature and throbbing, occasionally but not consistently radiating to entire abdomen, 10/10 in severity when present, associated with nausea and poor oral intake, cramps, no specific alleviating factors. Patient denies any diarrhea, no fevers or chills, no similar events in the past. Patient also denies chest pain or shortness of breath.  In emergency department, patient noted to be in mild distress due to abdominal pain, vital signs notable for heart rate up to 100, respiratory rate up to 27. Blood work notable for WBC 13.8, K 2.9, Cr 1.09. CT abdomen with no specific acute finding to explain source of pain. [facility] asked to admit for further evaluation to medical bed.  Assessment/Plan:    Principal Problem:  Generalized abdominal pain with intermittent diarrhea and nausea  - Unclear etiology, still present but per pt much better compared to yesterday  - CT abdomen with no specific findings to explain patient's pain - GI team consulted for further recommendations - C. Diff negative - advance diet to see if pt able to tolerate - provide analgesia and antiemetics as needed  Active Problems:  Leukocytosis - With HR up to 100 and WBC 13.8, patient meets criteria for SIRS/early sepsis - source possibly UTI - continue Rocephin day #2 and follow up on urine culture   Hypokalemia -  supplemented and WNL - Repeat BMP in the morning  Acute renal failure - Prerenal in etiology, IVF have been provided and Cr is now WNL   HTN - continue home regimen   ? Lytic and sclerotic process in the T10 vertebral body  - ? fibrous dysplasia or Paget's disease - MRI thoracic spine without and with contrast suggested for further evaluation to exclude a malignant process, order placed  - pt made aware of the findings   DVT prophylaxis - SCD's  Code Status: Full.  Family Communication:  plan of care discussed with the patient Disposition Plan: Home in 1-2 days  IV access:  Peripheral IV  Procedures and diagnostic studies:    Ct Abdomen Pelvis W Contrast 01/01/2015    No acute abdominal/pelvic findings, mass lesions or lymphadenopathy. No CT findings to suggest acute pancreatitis. 2. Cholelithiasis but no CT findings for acute cholecystitis. 3. Surgical changes from a previous left adrenalectomy. 4. Diverticulosis of the sigmoid colon without findings for acute diverticulitis. 5. 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 MRI thoracic spine without and with contrast suggested for further evaluation to exclude a malignant process.    Medical Consultants:  GI  Other Consultants:  None  IAnti-Infectives:   Rocephin 8/234 -->  Faye Ramsay, MD  [facility] Pager [phone]  If 7PM-7AM, please contact night-coverage [email or web address] Password [email or web address] 01/02/2015, 9:28 AM   LOS: 1 day   HPI/Subjective: No events overnight. Still reports abd pain in lower abd quadrants, better than yesterday.   Objective: Filed Vitals:   01/01/15 1316 01/01/15 2010 01/02/15 0543 01/02/15 0552  BP:  148/73 120/67 112/63   Pulse: 97 90 78   Temp: 98.5 F (36.9 C) 98.6 F (37 C) 99.2 F (37.3 C)   TempSrc: Oral Oral Oral   Resp: 20 16 17    Height: 5\' 7"  (1.702 m)     Weight:    107.729 kg (237 lb 8 oz)  SpO2: 98% 97% 93%      Intake/Output Summary (Last 24 hours) at 01/02/15 0928 Last data filed at 01/02/15 0851  Gross per 24 hour  Intake 1483.75 ml  Output     50 ml  Net 1433.75 ml    Exam:   General:  Pt is alert, follows commands appropriately, not in acute distress  Cardiovascular: Regular rate and rhythm, no rubs, no gallops  Respiratory: Clear to auscultation bilaterally, no wheezing, no crackles, no rhonchi  Abdomen: Soft, tender in lower abd quadrants, non distended, bowel sounds present, no guarding  Extremities: No edema, pulses DP and PT palpable bilaterally  Neuro: Grossly nonfocal  Data Reviewed: Basic Metabolic Panel:  Recent Labs Lab 01/01/15 0627 01/01/15 0633 01/02/15 0535  NA 137  --  137  K 2.9*  --  3.9  CL 102  --  107  CO2 22  --  24  GLUCOSE 184*  --  107*  BUN 21*  --  13  CREATININE 1.09*  --  0.78  CALCIUM 9.2  --  8.9  MG  --  2.3  --    Liver Function Tests:  Recent Labs Lab 01/01/15 0627  AST 19  ALT 13*  ALKPHOS 117  BILITOT 0.9  PROT 7.2  ALBUMIN 3.8    Recent Labs Lab 01/01/15 0627  LIPASE 15*   CBC:  Recent Labs Lab 01/01/15 0627 01/02/15 0535  WBC 13.8* 8.0  NEUTROABS 11.4*  --   HGB 11.5* 11.7*  HCT 35.7* 36.5  MCV 83.6 84.1  PLT 244 288    Recent Results (from the past 240 hour(s))  C difficile quick scan w PCR reflex     Status: None   Collection Time: 01/02/15  1:51 AM  Result Value Ref Range Status   C Diff antigen NEGATIVE NEGATIVE Final   C Diff toxin NEGATIVE NEGATIVE Final   C Diff interpretation Negative for toxigenic C. difficile  Final     Scheduled Meds: . amLODipine  10 mg Oral Daily   And  . irbesartan  300 mg Oral Daily  . atorvastatin  10 mg Oral q morning - 10a  . cefTRIAXone (ROCEPHIN)  IV  1 g Intravenous Q24H  . pantoprazole  40 mg Oral q morning - 10a   Continuous Infusions:

## 2015-01-03 ENCOUNTER — Inpatient Hospital Stay (HOSPITAL_COMMUNITY): Payer: Medicare HMO

## 2015-01-03 LAB — URINE CULTURE

## 2015-01-03 MED ORDER — OXYCODONE HCL 5 MG PO TABS: 5.0000 mg | ORAL_TABLET | ORAL | Status: DC | PRN

## 2015-01-03 MED ORDER — GADOBENATE DIMEGLUMINE 529 MG/ML IV SOLN
20.0000 mL | Freq: Once | INTRAVENOUS | Status: AC | PRN
  Administered 2015-01-03: 20 mL via INTRAVENOUS

## 2015-01-03 MED ORDER — HYDROCODONE-ACETAMINOPHEN 5-325 MG PO TABS: 1.0000 | ORAL_TABLET | ORAL | Status: DC | PRN

## 2015-01-03 MED ORDER — CIPROFLOXACIN HCL 500 MG PO TABS: 500.0000 mg | ORAL_TABLET | Freq: Two times a day (BID) | ORAL | Status: DC

## 2015-01-03 NOTE — Care Management Important Message (Signed)
Important Message  Patient Details  Name: Tara Moreno MRN: [id number] Date of Birth: Apr 17, 1946   Medicare Important Message Given:  Yes-second notification given    Camillo Flaming 01/03/2015, 1:32 PMImportant Message  Patient Details  Name: Tara Moreno MRN: [id number] Date of Birth: [birth year]   Medicare Important Message Given:  Yes-second notification given    Camillo Flaming 01/03/2015, 1:32 PM

## 2015-01-03 NOTE — Discharge Instructions (Signed)
Viral Gastroenteritis Viral gastroenteritis is also known as stomach flu. This condition affects the stomach and intestinal tract. It can cause sudden diarrhea and vomiting. The illness typically lasts 3 to 8 days. Most people develop an immune response that eventually gets rid of the virus. While this natural response develops, the virus can make you quite ill. CAUSES  Many different viruses can cause gastroenteritis, such as rotavirus or noroviruses. You can catch one of these viruses by consuming contaminated food or water. You may also catch a virus by sharing utensils or other personal items with an infected person or by touching a contaminated surface. SYMPTOMS  The most common symptoms are diarrhea and vomiting. These problems can cause a severe loss of body fluids (dehydration) and a body salt (electrolyte) imbalance. Other symptoms may include:  Fever.  Headache.  Fatigue.  Abdominal pain. DIAGNOSIS  Your caregiver can usually diagnose viral gastroenteritis based on your symptoms and a physical exam. A stool sample may also be taken to test for the presence of viruses or other infections. TREATMENT  This illness typically goes away on its own. Treatments are aimed at rehydration. The most serious cases of viral gastroenteritis involve vomiting so severely that you are not able to keep fluids down. In these cases, fluids must be given through an intravenous line (IV). HOME CARE INSTRUCTIONS   Drink enough fluids to keep your urine clear or pale yellow. Drink small amounts of fluids frequently and increase the amounts as tolerated.  Ask your caregiver for specific rehydration instructions.  Avoid:  Foods high in sugar.  Alcohol.  Carbonated drinks.  Tobacco.  Juice.  Caffeine drinks.  Extremely hot or cold fluids.  Fatty, greasy foods.  Too much intake of anything at one time.  Dairy products until 24 to 48 hours after diarrhea stops.  You may consume probiotics.  Probiotics are active cultures of beneficial bacteria. They may lessen the amount and number of diarrheal stools in adults. Probiotics can be found in yogurt with active cultures and in supplements.  Wash your hands well to avoid spreading the virus.  Only take over-the-counter or prescription medicines for pain, discomfort, or fever as directed by your caregiver. Do not give aspirin to children. Antidiarrheal medicines are not recommended.  Ask your caregiver if you should continue to take your regular prescribed and over-the-counter medicines.  Keep all follow-up appointments as directed by your caregiver. SEEK IMMEDIATE MEDICAL CARE IF:   You are unable to keep fluids down.  You do not urinate at least once every 6 to 8 hours.  You develop shortness of breath.  You notice blood in your stool or vomit. This may look like coffee grounds.  You have abdominal pain that increases or is concentrated in one small area (localized).  You have persistent vomiting or diarrhea.  You have a fever.  The patient is a child younger than 3 months, and he or she has a fever.  The patient is a child older than 3 months, and he or she has a fever and persistent symptoms.  The patient is a child older than 3 months, and he or she has a fever and symptoms suddenly get worse.  The patient is a baby, and he or she has no tears when crying. MAKE SURE YOU:   Understand these instructions.  Will watch your condition.  Will get help right away if you are not doing well or get worse. Document Released: 04/26/2005 Document Revised: 07/19/2011 Document Reviewed: 02/10/2011  Exit[employer] Patient Information 2015 [place]. This information is not intended to replace advice given to you by your health care provider. Make sure you discuss any questions you have with your health care provider.  Paget Disease of Bone As people age, their bones tend to rebuild at a slower rate. In Paget disease, however,  the rebuilding of bones takes place faster. As a result, the rebuilt bone can be softer and larger than it should be. This usually happens in one bone or a few bones in one area (localized). The disease is more common in older people. It can run in families, especially in those with European descent. Men are twice as likely as women to have this disease. Treatment can help most people with Paget disease. CAUSES  The cause is not known. It appears to run in families. It is possibly made active by an uncommon virus infection. SYMPTOMS  Common symptoms include:  Bone or joint pain.  Swelling of joints.  Tenderness or redness over the affected areas. Occasionally, people do not know they have Paget disease until they experience a broken bone (fracture) in a weakened area of bone. The most common bones affected by Paget disease are:  Pelvis.  Thighbone.  Spine.  Skull.  Shinbone. Further problems from Paget disease can include bony deformities, arthritis, and narrowing of the spine (stenosis). Heart disease may occur with severe Paget disease, because the heart works harder to pump blood to the affected bones. Certain types of Paget disease have a less than 1% chance of developing bone cancer. DIAGNOSIS  Diagnosis is most often made by X-rays. However, physical exam and lab studies can help to support the diagnosis. In a person with Paget disease, lab studies will show an increased level of alkaline phosphatase. This is a by-product of bone development. Painless bone scans can also be done to help determine the number of bones affected. TREATMENT  The goal of treatment is to relieve bone pain and prevent the disease from progressing. The following treatment options may be given. Drug therapy  Pain medicines. This includes acetaminophen and anti-inflammatory drugs, such as ibuprofen and naproxen.  Bisphosphonates. This is a class of drugs used to treat a variety of bone  diseases.  Calcitonin is a naturally occurring hormone made by the thyroid gland. This medicine may be appropriate for certain patients but is generally less effective than bisphosphonates and is seldom used. Surgery  In a patient with fractures, surgery may allow the bones to heal in better positions.  Surgery for arthritis caused by Paget disease is effective in reducing pain and improving function.  In a patient with bone deformity, cutting and realigning the affected bones (osteotomy) may reduce the pain in weight-bearing joints, especially the knees. HOME CARE INSTRUCTIONS   Take medicines as instructed.  Discuss intake of vitamin D and calcium supplements with your caregiver.  Discuss an exercise program with your caregiver. SEEK MEDICAL CARE IF:   You have increasing pain, or problems occur in spite of treatment. FOR MORE INFORMATION  The Paget Foundation: www.paget.[place] [facility] Osteoporosis and Related Bone Diseases: www.bones.[place] Arthritis Foundation: www.arthritis.org Document Released: 10/16/2001 Document Revised: 04/12/2012 Document Reviewed: 08/28/2009 [facility] Patient Information 2015 [place], [place]. This information is not intended to replace advice given to you by your health care provider. Make sure you discuss any questions you have with your health care provider.

## 2015-01-03 NOTE — Progress Notes (Signed)
[facility] PROGRESS NOTE Subjective patient feels much better this morning. Still some abdominal cramps and loose stools but no abdominal pain nausea or vomiting. C. difficile was negative on stool G.I. pathogen panel is pending. Patient is hungry.  Objective: Vital signs in last 24 hours: Temp:  [98.3 F (36.8 C)-99.2 F (37.3 C)] 98.3 F (36.8 C) (08/26 0621) Pulse Rate:  [87] 87 (08/26 0621) Resp:  [16-20] 20 (08/26 0621) BP: (139-145)/(71-73) 145/73 mmHg (08/26 0621) SpO2:  [94 %-99 %] 94 % (08/26 0621) Last BM Date: 01/02/15  Intake/Output from previous day: 08/25 0701 - 08/26 0700 In: 600 [P.O.:600] Out: -  Intake/Output this shift:    PE: General-- patient sitting up eating breakfast and ate essentially all of her breakfast.  Abdomen-- soft and nontender.  Lab Results:  Recent Labs  01/01/15 0627 01/02/15 0535  WBC 13.8* 8.0  HGB 11.5* 11.7*  HCT 35.7* 36.5  PLT 244 288   BMET  Recent Labs  01/01/15 0627 01/02/15 0535  NA 137 137  K 2.9* 3.9  CL 102 107  CO2 22 24  CREATININE 1.09* 0.78   LFT  Recent Labs  01/01/15 0627  PROT 7.2  AST 19  ALT 13*  ALKPHOS 117  BILITOT 0.9   PT/INR No results for input(s): LABPROT, INR in the last 72 hours. PANCREAS  Recent Labs  01/01/15 4196  LIPASE 15*         Studies/Results: Mr Thoracic Spine W Wo Contrast  01/03/2015   CLINICAL DATA:  69 year old female with sclerotic T10 bone lesion found on CT Abdomen and Pelvis , possible Paget disease. Recent abdominal pain. Subsequent encounter.  EXAM: MRI THORACIC SPINE WITHOUT AND WITH CONTRAST  TECHNIQUE: Multiplanar and multiecho pulse sequences of the thoracic spine were obtained without and with intravenous contrast.  CONTRAST:  66mL MULTIHANCE GADOBENATE DIMEGLUMINE 529 MG/ML IV SOLN  COMPARISON:  CT Abdomen and Pelvis 01/01/2015. Chest radiographs 02/22/2014 through 03/28/2008. Lumbar MRI 07/02/2004.  FINDINGS: Limited sagittal imaging  of the cervical spine suggests multilevel chronic lower cervical disc and endplate degeneration.  The T10 vertebral body is mildly enlarged compared the other levels, demonstrates mildly decreased T1 and T2 signal, and is without enhancement or STIR hyperintensity. On prior radiographs, this has appeared more sclerotic than adjacent level since 2009.  Visualized bone marrow signal elsewhere is within normal limits. Preserved thoracic vertebral height and alignment. No marrow edema or evidence of acute osseous abnormality. No abnormal enhancement identified.  Normal thoracic spinal canal and thecal sac patency. Spinal cord signal is within normal limits at all visualized levels. No abnormal intradural enhancement. Normal for age thoracic disc spaces. Mild upper lumbar disc desiccation since 2006.  Somewhat large right paracentral upper thoracic level posterior subcutaneous fat lipoma (series 10, image 1). This measures at least 2.3 x 10.9 x 9.9 cm (AP by transverse by CC) cardiomegaly. Pericardial effusion was more apparent on CT. No pleural effusions. 2 cm gallstone. Otherwise negative visualized upper abdominal viscera.  IMPRESSION: 1. Benign Paget disease of the T10 vertebral body. 2. No acute or significant findings in the thoracic spine. 3. Cardiomegaly.  2 cm gallstone. 4. Large right posterior subcutaneous lipoma.   Electronically Signed   By: Genevie Ann M.D.   On: 01/03/2015 07:58    Medications: I have reviewed the patient's current medications.  Assessment/Plan: 1. Acute gastroenteritis. This appears to be rapidly resolving. She is eating well with no upper symptoms. G.I. pathogen panel is pending but she  probably could be discharged with follow-up with PCP. Follow-up with Dr. Amedeo Plenty if needed.   Tara Moreno,Tara Moreno 01/03/2015, 9:28 AM  Pager: [phone] If no answer or after hours call [phone]

## 2015-01-03 NOTE — Discharge Summary (Addendum)
Physician Discharge Summary  Tara Moreno EXH:[id number] DOB: 1946-05-06 DOA: 01/01/2015  PCP: Patricia Nettle, MD  Admit date: 01/01/2015 Discharge date: 01/03/2015  Recommendations for Outpatient Follow-up:  1. Pt will need to follow up with PCP in 2-3 weeks post discharge 2. Please obtain BMP to evaluate electrolytes and kidney function 3. Please also check CBC to evaluate Hg and Hct levels 4. Continue Cipro upon discharge for 4 more days to complete therapy for UTI  Discharge Diagnoses:  Principal Problem:   Generalized abdominal pain Active Problems:   Leukocytosis   Hypokalemia   Acute renal failure  Discharge Condition: Stable  Diet recommendation: Heart healthy diet discussed in details    Brief narrative:    Patient is 69 year old female with hypertension and hyperlipidemia, recent hospitalization from 10/09/14 - 10/15/2014 for bilateral knee arthroplasty, now presenting to [facility] emergency department with main concern of several days duration of progressively worsening abdominal pain, mostly located in bilateral lower quadrants, initially constant and sharp but currently intermittent in nature and throbbing, occasionally but not consistently radiating to entire abdomen, 10/10 in severity when present, associated with nausea and poor oral intake, cramps, no specific alleviating factors. Patient denies any diarrhea, no fevers or chills, no similar events in the past. Patient also denies chest pain or shortness of breath.  In emergency department, patient noted to be in mild distress due to abdominal pain, vital signs notable for heart rate up to 100, respiratory rate up to 27. Blood work notable for WBC 13.8, K 2.9, Cr 1.09. CT abdomen with no specific acute finding to explain source of pain. TRH asked to admit for further evaluation to medical bed.  Assessment/Plan:    Principal Problem:  Generalized abdominal pain with intermittent diarrhea and nausea  -  Thought to be secondary to viral gastroenteritis - CT abdomen with no specific findings to explain patient's pain - Patient tolerating regular diet well, denies nausea or vomiting - C. Diff negative - Patient clear for discharge from GI standpoint, will need outpatient follow-up Active Problems:  Leukocytosis - With HR up to 100 and WBC 13.8, patient meets criteria for SIRS/early sepsis - Source UTI - Patient initially started on Rocephin and will be transitioned to ciprofloxacin to complete therapy for 4 more days post discharge  Hypokalemia - supplemented and WNL 8/25  Acute renal failure - Prerenal in etiology, IVF have been provided and Cr was WNL 8/25  HTN - continue home regimen   Lytic and sclerotic process in the T10 vertebral body  - MRI of thoracic spine confirming Paget's disease - This was discussed with patient at bedside - More information with brochure provided on discharge - Also provided patient with my phone number for further questions or concerns - Outpatient follow-up  Code Status: Full.  Family Communication: plan of care discussed with the patient Disposition Plan: Home today  IV access:  Peripheral IV  Procedures and diagnostic studies:   Ct Abdomen Pelvis W Contrast 01/01/2015 No acute abdominal/pelvic findings, mass lesions or lymphadenopathy. No CT findings to suggest acute pancreatitis. 2. Cholelithiasis but no CT findings for acute cholecystitis. 3. Surgical changes from a previous left adrenalectomy. 4. Diverticulosis of the sigmoid colon without findings for acute diverticulitis. 5. 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 MRI thoracic spine without and with contrast suggested for further evaluation to exclude a malignant process.   Medical Consultants:  GI  Other Consultants:  None  IAnti-Infectives:   Rocephin 8/24 --> change to PO Cipro to  complete therapy for 4 more days post discharge      Discharge Exam: Filed Vitals:   01/03/15 0621  BP: 145/73  Pulse: 87  Temp: 98.3 F (36.8 C)  Resp: 20   Filed Vitals:   01/02/15 0543 01/02/15 0552 01/02/15 2106 01/03/15 0621  BP: 112/63  139/71 145/73  Pulse: 78  87 87  Temp: 99.2 F (37.3 C)  99.2 F (37.3 C) 98.3 F (36.8 C)  TempSrc: Oral  Oral Oral  Resp: 17  16 20   Height:      Weight:  107.729 kg (237 lb 8 oz)    SpO2: 93%  99% 94%    General: Pt is alert, follows commands appropriately, not in acute distress Cardiovascular: Regular rate and rhythm, S1/S2 +, no rubs, no gallops Respiratory: Clear to auscultation bilaterally, no wheezing, no crackles, no rhonchi Abdominal: Soft, non tender, non distended, bowel sounds +, no guarding Extremities: no edema, no cyanosis, pulses palpable bilaterally DP and PT  Discharge Instructions     Medication List    STOP taking these medications        docusate sodium 100 MG capsule  Commonly known as:  COLACE     methocarbamol 500 MG tablet  Commonly known as:  ROBAXIN     metoCLOPramide 5 MG tablet  Commonly known as:  REGLAN     ondansetron 4 MG tablet  Commonly known as:  ZOFRAN     oxyCODONE 5 MG immediate release tablet  Commonly known as:  Oxy IR/ROXICODONE     polyethylene glycol packet  Commonly known as:  MIRALAX / GLYCOLAX     traMADol 50 MG tablet  Commonly known as:  ULTRAM     warfarin 5 MG tablet  Commonly known as:  COUMADIN      TAKE these medications        acetaminophen 325 MG tablet  Commonly known as:  TYLENOL  Take 2 tablets (650 mg total) by mouth every 6 (six) hours as needed for mild pain (or Fever >/= 101).     atorvastatin 10 MG tablet  Commonly known as:  LIPITOR  Take 10 mg by mouth every morning.     AZOR 10-40 MG per tablet  Generic drug:  amLODipine-olmesartan  Take 1 tablet by mouth daily.     bisacodyl 10 MG suppository  Commonly known as:  DULCOLAX  Place  1 suppository (10 mg total) rectally daily as needed for moderate constipation.     ciprofloxacin 500 MG tablet  Commonly known as:  CIPRO  Take 1 tablet (500 mg total) by mouth 2 (two) times daily.     HYDROcodone-acetaminophen 5-325 MG per tablet  Commonly known as:  NORCO/VICODIN  Take 1-2 tablets by mouth every 4 (four) hours as needed for moderate pain.     niacin 500 MG tablet  Take 500 mg by mouth every morning.     pantoprazole 40 MG tablet  Commonly known as:  PROTONIX  Take 40 mg by mouth every morning.     RESTASIS OP  Place 1 drop into both eyes daily as needed (dry eyes).              Follow-up Information    Follow up with Patricia Nettle, MD.   Specialty:  Cardiology   Contact information:   [place] [address] [phone]  The results of significant diagnostics from this hospitalization (including imaging, microbiology, ancillary and laboratory) are listed below for reference.     Microbiology: Recent Results (from the past 240 hour(s))  C difficile quick scan w PCR reflex     Status: None   Collection Time: 01/02/15  1:51 AM  Result Value Ref Range Status   C Diff antigen NEGATIVE NEGATIVE Final   C Diff toxin NEGATIVE NEGATIVE Final   C Diff interpretation Negative for toxigenic C. difficile  Final     Labs: Basic Metabolic Panel:  Recent Labs Lab 01/01/15 0627 01/01/15 0633 01/02/15 0535  NA 137  --  137  K 2.9*  --  3.9  CL 102  --  107  CO2 22  --  24  GLUCOSE 184*  --  107*  BUN 21*  --  13  CREATININE 1.09*  --  0.78  CALCIUM 9.2  --  8.9  MG  --  2.3  --    Liver Function Tests:  Recent Labs Lab 01/01/15 0627  AST 19  ALT 13*  ALKPHOS 117  BILITOT 0.9  PROT 7.2  ALBUMIN 3.8    Recent Labs Lab 01/01/15 0627  LIPASE 15*   CBC:  Recent Labs Lab 01/01/15 0627 01/02/15 0535  WBC 13.8* 8.0  NEUTROABS 11.4*  --   HGB 11.5* 11.7*  HCT 35.7* 36.5  MCV 83.6 84.1  PLT 244 288      SIGNED: Time coordinating discharge: 30 minutes  Faye Ramsay, MD  [facility] 01/03/2015, 8:36 AM Pager [phone]  If 7PM-7AM, please contact night-coverage [email or web address] Password [email or web address]

## 2015-01-04 LAB — GI PATHOGEN PANEL BY PCR, STOOL
C difficile toxin A/B: NOT DETECTED
Campylobacter by PCR: NOT DETECTED
Cryptosporidium by PCR: NOT DETECTED
E COLI (STEC): NOT DETECTED
E coli (ETEC) LT/ST: NOT DETECTED
E coli 0157 by PCR: NOT DETECTED
G lamblia by PCR: NOT DETECTED
NOROVIRUS G1/G2: NOT DETECTED
ROTAVIRUS A BY PCR: NOT DETECTED
SALMONELLA BY PCR: NOT DETECTED
SHIGELLA BY PCR: NOT DETECTED

## 2015-03-21 ENCOUNTER — Other Ambulatory Visit: Payer: Self-pay | Admitting: Gastroenterology

## 2015-08-26 ENCOUNTER — Other Ambulatory Visit: Payer: Self-pay

## 2015-08-26 DIAGNOSIS — Z1231 Encounter for screening mammogram for malignant neoplasm of breast: Secondary | ICD-10-CM

## 2015-09-16 ENCOUNTER — Ambulatory Visit: Payer: Commercial Managed Care - HMO

## 2015-11-17 ENCOUNTER — Ambulatory Visit
Admission: RE | Admit: 2015-11-17 | Discharge: 2015-11-17 | Disposition: A | Discharge status: Home / Self Care | Payer: Medicare HMO | Source: Ambulatory Visit

## 2015-11-17 DIAGNOSIS — Z1231 Encounter for screening mammogram for malignant neoplasm of breast: Secondary | ICD-10-CM

## 2015-12-30 ENCOUNTER — Emergency Department (HOSPITAL_COMMUNITY)
Admission: EM | Admit: 2015-12-30 | Discharge: 2015-12-30 | Disposition: A | Discharge status: Home / Self Care | Payer: Medicare HMO | Attending: Emergency Medicine | Admitting: Emergency Medicine

## 2015-12-30 ENCOUNTER — Encounter (HOSPITAL_COMMUNITY): Payer: Self-pay | Admitting: Emergency Medicine

## 2015-12-30 ENCOUNTER — Emergency Department (HOSPITAL_COMMUNITY): Payer: Medicare HMO

## 2015-12-30 DIAGNOSIS — I10 Essential (primary) hypertension: Secondary | ICD-10-CM | POA: Insufficient documentation

## 2015-12-30 DIAGNOSIS — Z79899 Other long term (current) drug therapy: Secondary | ICD-10-CM | POA: Insufficient documentation

## 2015-12-30 DIAGNOSIS — J40 Bronchitis, not specified as acute or chronic: Secondary | ICD-10-CM | POA: Insufficient documentation

## 2015-12-30 DIAGNOSIS — R05 Cough: Secondary | ICD-10-CM | POA: Diagnosis present

## 2015-12-30 MED ORDER — ALBUTEROL SULFATE HFA 108 (90 BASE) MCG/ACT IN AERS: 1.0000 | INHALATION_SPRAY | Freq: Four times a day (QID) | RESPIRATORY_TRACT | 0 refills | Status: DC | PRN

## 2015-12-30 MED ORDER — PREDNISONE 10 MG (21) PO TBPK: 10.0000 mg | ORAL_TABLET | Freq: Every day | ORAL | 0 refills | Status: DC

## 2015-12-30 MED ORDER — AZITHROMYCIN 250 MG PO TABS
250.0000 mg | ORAL_TABLET | Freq: Every day | ORAL | 0 refills | Status: DC
Start: 2015-12-30 — End: 2017-06-21

## 2015-12-30 MED ORDER — GUAIFENESIN 100 MG/5ML PO LIQD: 100.0000 mg | ORAL | 0 refills | Status: DC | PRN

## 2015-12-30 MED ORDER — ALBUTEROL SULFATE HFA 108 (90 BASE) MCG/ACT IN AERS
2.0000 | INHALATION_SPRAY | Freq: Once | RESPIRATORY_TRACT | Status: AC
  Administered 2015-12-30: 2 via RESPIRATORY_TRACT
  Filled 2015-12-30: qty 6.7

## 2015-12-30 MED ORDER — GUAIFENESIN ER 600 MG PO TB12
1200.0000 mg | ORAL_TABLET | ORAL | Status: AC
  Administered 2015-12-30: 1200 mg via ORAL
  Filled 2015-12-30: qty 2

## 2015-12-30 MED ORDER — IPRATROPIUM-ALBUTEROL 0.5-2.5 (3) MG/3ML IN SOLN
3.0000 mL | Freq: Once | RESPIRATORY_TRACT | Status: AC
  Administered 2015-12-30: 3 mL via RESPIRATORY_TRACT
  Filled 2015-12-30: qty 3

## 2015-12-30 MED ORDER — BENZONATATE 100 MG PO CAPS: 100.0000 mg | ORAL_CAPSULE | Freq: Every evening | ORAL | 0 refills | Status: DC | PRN

## 2015-12-30 MED ORDER — ALBUTEROL SULFATE (2.5 MG/3ML) 0.083% IN NEBU
5.0000 mg | INHALATION_SOLUTION | RESPIRATORY_TRACT | Status: DC | PRN
Start: 2015-12-30 — End: 2015-12-31
  Administered 2015-12-30: 5 mg via RESPIRATORY_TRACT
  Filled 2015-12-30: qty 6

## 2015-12-30 MED ORDER — CETIRIZINE HCL 10 MG PO TABS: 10.0000 mg | ORAL_TABLET | Freq: Every day | ORAL | 1 refills | Status: DC

## 2015-12-30 MED ORDER — PREDNISONE 20 MG PO TABS
60.0000 mg | ORAL_TABLET | Freq: Once | ORAL | Status: AC
  Administered 2015-12-30: 60 mg via ORAL
  Filled 2015-12-30: qty 3

## 2015-12-30 NOTE — ED Provider Notes (Signed)
Patient's care assumed from [facility], PA-C a chip change. Please see her note for further. Briefly, the patient presented with URI symptoms, shortness of breath and coughing for the past 8 days. No fevers. Chest X-ray shows no infiltrate or acute process. At shift change plan is for patient to receive albuterol treatment and reevaluate. Plan is for discharge. On my examination patient reports she is feeling better. She is no longer short of breath. On lung exam her lungs are clear to auscultation. No wheezes or rhonchi. Oxygen saturation is 100% on room air. Will provide with an albuterol inhaler at discharge. Patient with prescriptions by [facility] and I added Zyrtec and Z-Pak. She has good follow-up with primary care. I discussed strict and specific return precautions. I advised the patient to follow-up with their primary care provider this week. I advised the patient to return to the emergency department with new or worsening symptoms or new concerns. The patient verbalized understanding and agreement with plan.    Dg Chest 2 View  Result Date: 12/30/2015 CLINICAL DATA:  Productive cough EXAM: CHEST  2 VIEW COMPARISON:  Chest radiograph 02/22/2014 FINDINGS: Cardiomegaly is unchanged. No pulmonary edema. No focal airspace consolidation. No pneumothorax or pleural effusion. Numerous surgical clips are seen in the left upper quadrant. There have been interval shoulder arthroplasties. IMPRESSION: Unchanged cardiomegaly. No focal airspace disease or pulmonary edema. Electronically Signed   By: Ulyses Jarred M.D.   On: 12/30/2015 19:27    Vitals:   12/30/15 1835 12/30/15 2147 12/30/15 2219 12/30/15 2304  BP: 124/86   139/66  Pulse: 75   85  Resp: 18   18  Temp: 98.2 F (36.8 C)   98.2 F (36.8 C)  TempSrc: Oral   Oral  SpO2: 100% 99% 99% 99%    Bronchitis     Waynetta Pean, PA-C 12/30/15 2320    Lacretia Leigh, MD 12/31/15 1353

## 2015-12-30 NOTE — ED Notes (Signed)
Called for fast track rm x1 No response

## 2015-12-30 NOTE — ED Provider Notes (Signed)
[facility] DEPT Provider Note   CSN: [id number] Arrival date & time: 12/30/15  1733     History   Chief Complaint Chief Complaint  Patient presents with  . Nasal Congestion    HPI Tara Moreno is a 70 y.o. female.  HPI   Patient with hx HTN, HLD p/w 8 days of URI symptoms and a cough and SOB that have persisted. She started with sore throat that has improved, has had rhinorrhea, intermittent headaches, nausea, and cough.  Cough is occasionally productive but she feels she has a lot of phlegm in her chest.  Is SOB, worse with any activity.  Denies fevers, nasal congestion, chest pain, leg swelling, recent immobilization.  Has been taking coricidin, lemon juice, vinegar, and honey for her symptoms.    Past Medical History:  Diagnosis Date  . Arthritis   . Frequent urination at night   . GERD (gastroesophageal reflux disease)   . Hyperlipemia   . Hypertension   . PONV (postoperative nausea and vomiting)     Patient Active Problem List   Diagnosis Date Noted  . Abdominal pain 01/01/2015  . Leukocytosis 01/01/2015  . Hypokalemia 01/01/2015  . Acute renal failure ([place]) 01/01/2015  . Generalized abdominal pain   . OA (osteoarthritis) of knee 10/09/2014  . S/P shoulder replacement 07/05/2014  . Arthritis of shoulder region, right, degenerative 03/08/2014  . Chest discomfort 08/10/2012    Past Surgical History:  Procedure Laterality Date  . ABDOMINAL HYSTERECTOMY    . banectomy    . BREAST ENHANCEMENT SURGERY  2000  . CARDIAC CATHETERIZATION     no PCI; had before renal tumor was removed  . COLONOSCOPY    . PARTIAL HYSTERECTOMY    . removal of the renal tumor    . REVERSE SHOULDER ARTHROPLASTY Right 03/08/2014   Procedure: RIGHT TOTAL SHOULDER ARTHROPLASTY VERSES A REVERSE ARTHROPLASTY;  Surgeon: Augustin Schooling, MD;  Location: [facility];  Service: Orthopedics;  Laterality: Right;  . REVERSE SHOULDER ARTHROPLASTY Left 07/05/2014   Procedure: LEFT REVERSE SHOULDER  ARTHROPLASTY;  Surgeon: Augustin Schooling, MD;  Location: [facility];  Service: Orthopedics;  Laterality: Left;  . REVISION TOTAL SHOULDER ARTHROPLASTY Right 03/08/2014   dr Veverly Fells  . TONSILLECTOMY    . TOTAL KNEE ARTHROPLASTY Bilateral 10/09/2014   Procedure: BILATERAL TOTAL KNEE ARTHROPLASTY;  Surgeon: Gaynelle Arabian, MD;  Location: [facility];  Service: Orthopedics;  Laterality: Bilateral;  with general anesthesia    OB History    No data available       Home Medications    Prior to Admission medications   Medication Sig Start Date End Date Taking? Authorizing Provider  acetaminophen (TYLENOL) 325 MG tablet Take 2 tablets (650 mg total) by mouth every 6 (six) hours as needed for mild pain (or Fever >/= 101). Patient not taking: Reported on 01/01/2015 10/11/14   [name], PA-C  albuterol (PROVENTIL HFA;VENTOLIN HFA) 108 [phone] Base) MCG/ACT inhaler Inhale 1-2 puffs into the lungs every 6 (six) hours as needed for wheezing or shortness of breath. 12/30/15   Clayton Bibles, PA-C  amLODipine-olmesartan (AZOR) 10-40 MG per tablet Take 1 tablet by mouth daily.    Historical Provider, MD  atorvastatin (LIPITOR) 10 MG tablet Take 10 mg by mouth every morning.     Historical Provider, MD  benzonatate (TESSALON) 100 MG capsule Take 1 capsule (100 mg total) by mouth at bedtime as needed for cough. 12/30/15   Clayton Bibles, PA-C  bisacodyl (DULCOLAX) 10 MG  suppository Place 1 suppository (10 mg total) rectally daily as needed for moderate constipation. Patient not taking: Reported on 01/01/2015 10/11/14   [name], PA-C  ciprofloxacin (CIPRO) 500 MG tablet Take 1 tablet (500 mg total) by mouth 2 (two) times daily. 01/03/15   Theodis Blaze, MD  CycloSPORINE (RESTASIS OP) Place 1 drop into both eyes daily as needed (dry eyes).     Historical Provider, MD  guaiFENesin (ROBITUSSIN) 100 MG/5ML liquid Take 5-10 mLs (100-200 mg total) by mouth every 4 (four) hours as needed for cough. 12/30/15   Clayton Bibles, PA-C    HYDROcodone-acetaminophen (NORCO/VICODIN) 5-325 MG per tablet Take 1-2 tablets by mouth every 4 (four) hours as needed for moderate pain. 01/03/15   Theodis Blaze, MD  niacin 500 MG tablet Take 500 mg by mouth every morning.     Historical Provider, MD  pantoprazole (PROTONIX) 40 MG tablet Take 40 mg by mouth every morning.     Historical Provider, MD  predniSONE (STERAPRED UNI-PAK 21 TAB) 10 MG (21) TBPK tablet Take 1 tablet (10 mg total) by mouth daily. Day 1: take 6 tabs.  Day 2: 5 tabs  Day 3: 4 tabs  Day 4: 3 tabs  Day 5: 2 tabs  Day 6: 1 tab 12/30/15   Clayton Bibles, PA-C    Family History Family History  Problem Relation Age of Onset  . Hypertension Mother   . Hypertension Father   . Hypertension Sister   . Hypertension Brother   . Hypertension Other     Social History Social History  Substance Use Topics  . Smoking status: Never Smoker  . Smokeless tobacco: Never Used  . Alcohol use No     Allergies   Review of patient's allergies indicates no known allergies.   Review of Systems Review of Systems  Constitutional: Negative for chills and fever.  HENT: Positive for rhinorrhea. Negative for congestion, sinus pressure and trouble swallowing.   Respiratory: Positive for cough and shortness of breath.   Cardiovascular: Negative for chest pain and leg swelling.  Musculoskeletal: Negative for neck pain and neck stiffness.  Skin: Negative for rash.  Allergic/Immunologic: Negative for immunocompromised state.  Neurological: Positive for dizziness and headaches.     Physical Exam Updated Vital Signs BP 124/86 (BP Location: Left Arm)   Pulse 75   Temp 98.2 F (36.8 C) (Oral)   Resp 18   SpO2 99%   Physical Exam  Constitutional: She appears well-developed and well-nourished. No distress.  HENT:  Head: Normocephalic and atraumatic.  Mouth/Throat: Oropharynx is clear and moist. No oropharyngeal exudate.  Eyes: Conjunctivae are normal.  Neck: Neck supple.   Cardiovascular: Normal rate and regular rhythm.   Pulmonary/Chest: Effort normal. No respiratory distress. She has decreased breath sounds. She has wheezes. She has no rales.  Coarse breath sounds   Neurological: She is alert.  Skin: She is not diaphoretic.  Nursing note and vitals reviewed.    ED Treatments / Results  Labs (all labs ordered are listed, but only abnormal results are displayed) Labs Reviewed - No data to display  EKG  EKG Interpretation None       Radiology Dg Chest 2 View  Result Date: 12/30/2015 CLINICAL DATA:  Productive cough EXAM: CHEST  2 VIEW COMPARISON:  Chest radiograph 02/22/2014 FINDINGS: Cardiomegaly is unchanged. No pulmonary edema. No focal airspace consolidation. No pneumothorax or pleural effusion. Numerous surgical clips are seen in the left upper quadrant. There have been interval shoulder  arthroplasties. IMPRESSION: Unchanged cardiomegaly. No focal airspace disease or pulmonary edema. Electronically Signed   By: Ulyses Jarred M.D.   On: 12/30/2015 19:27    Procedures Procedures (including critical care time)  Medications Ordered in ED Medications  guaiFENesin (MUCINEX) 12 hr tablet 1,200 mg (not administered)  albuterol (PROVENTIL) (2.5 MG/3ML) 0.083% nebulizer solution 5 mg (not administered)  ipratropium-albuterol (DUONEB) 0.5-2.5 (3) MG/3ML nebulizer solution 3 mL (3 mLs Nebulization Given 12/30/15 2145)  predniSONE (DELTASONE) tablet 60 mg (60 mg Oral Given 12/30/15 2158)     Initial Impression / Assessment and Plan / ED Course  I have reviewed the triage vital signs and the nursing notes.  Pertinent labs & imaging results that were available during my care of the patient were reviewed by me and considered in my medical decision making (see chart for details).  Clinical Course    Afebrile, nontoxic patient with constellation of symptoms suggestive of bronchitis.  Significant wheezing and decreased air movement on exam.  CXR  negative.  Pt treated with nebs and steroids in ED. Pt with slight improvement of symptoms and continued harsh wheezing after first neb.  Pt to be signed out to PA Will Dansie at change of shift pending symptom improvement.  Anticipate d/c home with prescriptions below, PCP follow up.   Final Clinical Impressions(s) / ED Diagnoses   Final diagnoses:  Bronchitis    New Prescriptions New Prescriptions   ALBUTEROL (PROVENTIL HFA;VENTOLIN HFA) 108 (90 BASE) MCG/ACT INHALER    Inhale 1-2 puffs into the lungs every 6 (six) hours as needed for wheezing or shortness of breath.   BENZONATATE (TESSALON) 100 MG CAPSULE    Take 1 capsule (100 mg total) by mouth at bedtime as needed for cough.   GUAIFENESIN (ROBITUSSIN) 100 MG/5ML LIQUID    Take 5-10 mLs (100-200 mg total) by mouth every 4 (four) hours as needed for cough.   PREDNISONE (STERAPRED UNI-PAK 21 TAB) 10 MG (21) TBPK TABLET    Take 1 tablet (10 mg total) by mouth daily. Day 1: take 6 tabs.  Day 2: 5 tabs  Day 3: 4 tabs  Day 4: 3 tabs  Day 5: 2 tabs  Day 6: 1 tab     Clayton Bibles, PA-C 12/30/15 2211    Lacretia Leigh, MD 12/31/15 1353

## 2015-12-30 NOTE — Discharge Instructions (Signed)
Read the information below.  Use the prescribed medication as directed.  Please discuss all new medications with your pharmacist.  You may return to the Emergency Department at any time for worsening condition or any new symptoms that concern you.   If you develop worsening shortness of breath, uncontrolled wheezing, severe chest pain, or fevers despite using tylenol and/or ibuprofen, return for a recheck.     °

## 2015-12-30 NOTE — ED Triage Notes (Addendum)
Patient presents for nasal congestion x1 week. Reports non productive worsens while lying and at night. Reports HA earlier that has now resolved. Denies fever, N/V.

## 2016-06-04 DIAGNOSIS — I10 Essential (primary) hypertension: Secondary | ICD-10-CM | POA: Diagnosis not present

## 2016-06-04 DIAGNOSIS — R0609 Other forms of dyspnea: Secondary | ICD-10-CM | POA: Diagnosis not present

## 2016-06-04 DIAGNOSIS — M1990 Unspecified osteoarthritis, unspecified site: Secondary | ICD-10-CM | POA: Diagnosis not present

## 2016-06-04 DIAGNOSIS — E785 Hyperlipidemia, unspecified: Secondary | ICD-10-CM | POA: Diagnosis not present

## 2016-06-04 DIAGNOSIS — K219 Gastro-esophageal reflux disease without esophagitis: Secondary | ICD-10-CM | POA: Diagnosis not present

## 2016-06-04 DIAGNOSIS — Z90710 Acquired absence of both cervix and uterus: Secondary | ICD-10-CM | POA: Diagnosis not present

## 2016-06-04 DIAGNOSIS — E896 Postprocedural adrenocortical (-medullary) hypofunction: Secondary | ICD-10-CM | POA: Diagnosis not present

## 2016-06-04 DIAGNOSIS — R635 Abnormal weight gain: Secondary | ICD-10-CM | POA: Diagnosis not present

## 2016-06-04 DIAGNOSIS — K589 Irritable bowel syndrome without diarrhea: Secondary | ICD-10-CM | POA: Diagnosis not present

## 2016-06-04 DIAGNOSIS — E6609 Other obesity due to excess calories: Secondary | ICD-10-CM | POA: Diagnosis not present

## 2016-07-26 DIAGNOSIS — M1990 Unspecified osteoarthritis, unspecified site: Secondary | ICD-10-CM | POA: Diagnosis not present

## 2016-07-26 DIAGNOSIS — Z0000 Encounter for general adult medical examination without abnormal findings: Secondary | ICD-10-CM | POA: Diagnosis not present

## 2016-07-26 DIAGNOSIS — K589 Irritable bowel syndrome without diarrhea: Secondary | ICD-10-CM | POA: Diagnosis not present

## 2016-07-26 DIAGNOSIS — M8588 Other specified disorders of bone density and structure, other site: Secondary | ICD-10-CM | POA: Diagnosis not present

## 2016-07-26 DIAGNOSIS — E6609 Other obesity due to excess calories: Secondary | ICD-10-CM | POA: Diagnosis not present

## 2016-07-26 DIAGNOSIS — E896 Postprocedural adrenocortical (-medullary) hypofunction: Secondary | ICD-10-CM | POA: Diagnosis not present

## 2016-07-26 DIAGNOSIS — Z90710 Acquired absence of both cervix and uterus: Secondary | ICD-10-CM | POA: Diagnosis not present

## 2016-07-26 DIAGNOSIS — I10 Essential (primary) hypertension: Secondary | ICD-10-CM | POA: Diagnosis not present

## 2016-07-26 DIAGNOSIS — E785 Hyperlipidemia, unspecified: Secondary | ICD-10-CM | POA: Diagnosis not present

## 2016-07-26 DIAGNOSIS — K219 Gastro-esophageal reflux disease without esophagitis: Secondary | ICD-10-CM | POA: Diagnosis not present

## 2016-08-31 DIAGNOSIS — R011 Cardiac murmur, unspecified: Secondary | ICD-10-CM | POA: Diagnosis not present

## 2016-08-31 DIAGNOSIS — Z0000 Encounter for general adult medical examination without abnormal findings: Secondary | ICD-10-CM | POA: Diagnosis not present

## 2016-08-31 DIAGNOSIS — E785 Hyperlipidemia, unspecified: Secondary | ICD-10-CM | POA: Diagnosis not present

## 2016-08-31 DIAGNOSIS — I10 Essential (primary) hypertension: Secondary | ICD-10-CM | POA: Diagnosis not present

## 2016-08-31 DIAGNOSIS — K219 Gastro-esophageal reflux disease without esophagitis: Secondary | ICD-10-CM | POA: Diagnosis not present

## 2016-08-31 DIAGNOSIS — R42 Dizziness and giddiness: Secondary | ICD-10-CM | POA: Diagnosis not present

## 2016-08-31 DIAGNOSIS — E669 Obesity, unspecified: Secondary | ICD-10-CM | POA: Diagnosis not present

## 2016-08-31 DIAGNOSIS — Z6839 Body mass index (BMI) 39.0-39.9, adult: Secondary | ICD-10-CM | POA: Diagnosis not present

## 2016-08-31 DIAGNOSIS — Z79899 Other long term (current) drug therapy: Secondary | ICD-10-CM | POA: Diagnosis not present

## 2016-09-13 DIAGNOSIS — M8588 Other specified disorders of bone density and structure, other site: Secondary | ICD-10-CM | POA: Diagnosis not present

## 2016-09-13 DIAGNOSIS — Z01411 Encounter for gynecological examination (general) (routine) with abnormal findings: Secondary | ICD-10-CM | POA: Diagnosis not present

## 2016-10-13 ENCOUNTER — Other Ambulatory Visit: Payer: Self-pay | Admitting: Internal Medicine

## 2016-10-13 DIAGNOSIS — Z1231 Encounter for screening mammogram for malignant neoplasm of breast: Secondary | ICD-10-CM

## 2016-10-14 DIAGNOSIS — R69 Illness, unspecified: Secondary | ICD-10-CM | POA: Diagnosis not present

## 2016-11-18 ENCOUNTER — Ambulatory Visit
Admission: RE | Admit: 2016-11-18 | Discharge: 2016-11-18 | Disposition: A | Discharge status: Home / Self Care | Payer: Medicare HMO | Source: Ambulatory Visit | Attending: Internal Medicine | Admitting: Internal Medicine

## 2016-11-18 DIAGNOSIS — Z1231 Encounter for screening mammogram for malignant neoplasm of breast: Secondary | ICD-10-CM | POA: Diagnosis not present

## 2016-11-22 DIAGNOSIS — M85859 Other specified disorders of bone density and structure, unspecified thigh: Secondary | ICD-10-CM | POA: Diagnosis not present

## 2016-11-22 DIAGNOSIS — I10 Essential (primary) hypertension: Secondary | ICD-10-CM | POA: Diagnosis not present

## 2016-11-22 DIAGNOSIS — E785 Hyperlipidemia, unspecified: Secondary | ICD-10-CM | POA: Diagnosis not present

## 2016-11-22 DIAGNOSIS — R635 Abnormal weight gain: Secondary | ICD-10-CM | POA: Diagnosis not present

## 2016-11-22 DIAGNOSIS — K219 Gastro-esophageal reflux disease without esophagitis: Secondary | ICD-10-CM | POA: Diagnosis not present

## 2016-12-30 DIAGNOSIS — M5136 Other intervertebral disc degeneration, lumbar region: Secondary | ICD-10-CM | POA: Diagnosis not present

## 2016-12-30 DIAGNOSIS — M19042 Primary osteoarthritis, left hand: Secondary | ICD-10-CM | POA: Diagnosis not present

## 2017-02-23 DIAGNOSIS — I10 Essential (primary) hypertension: Secondary | ICD-10-CM | POA: Diagnosis not present

## 2017-02-23 DIAGNOSIS — L209 Atopic dermatitis, unspecified: Secondary | ICD-10-CM | POA: Diagnosis not present

## 2017-04-07 DIAGNOSIS — R69 Illness, unspecified: Secondary | ICD-10-CM | POA: Diagnosis not present

## 2017-04-25 DIAGNOSIS — D1721 Benign lipomatous neoplasm of skin and subcutaneous tissue of right arm: Secondary | ICD-10-CM | POA: Diagnosis not present

## 2017-06-09 NOTE — H&P (Signed)
Tara Moreno is an 72 y.o. female.    Chief Complaint: right shoulder/scapular mass  HPI: Pt is a 72 y.o. female complaining of mass to right shoulder for several months. Pain had continually increased since the beginning. Various options are discussed with the patient. Risks, benefits and expectations were discussed with the patient. Patient understand the risks, benefits and expectations and wishes to proceed with surgery.   PCP:  Wallene Huh, MD  D/C Plans: Home  PMH: Past Medical History:  Diagnosis Date  . Arthritis   . Frequent urination at night   . GERD (gastroesophageal reflux disease)   . Hyperlipemia   . Hypertension   . PONV (postoperative nausea and vomiting)     PSH: Past Surgical History:  Procedure Laterality Date  . ABDOMINAL HYSTERECTOMY    . AUGMENTATION MAMMAPLASTY    . banectomy    . BREAST ENHANCEMENT SURGERY  2000  . CARDIAC CATHETERIZATION     no PCI; had before renal tumor was removed  . COLONOSCOPY    . PARTIAL HYSTERECTOMY    . removal of the renal tumor    . REVERSE SHOULDER ARTHROPLASTY Right 03/08/2014   Procedure: RIGHT TOTAL SHOULDER ARTHROPLASTY VERSES A REVERSE ARTHROPLASTY;  Surgeon: Augustin Schooling, MD;  Location: [facility];  Service: Orthopedics;  Laterality: Right;  . REVERSE SHOULDER ARTHROPLASTY Left 07/05/2014   Procedure: LEFT REVERSE SHOULDER ARTHROPLASTY;  Surgeon: Augustin Schooling, MD;  Location: [facility];  Service: Orthopedics;  Laterality: Left;  . REVISION TOTAL SHOULDER ARTHROPLASTY Right 03/08/2014   dr Veverly Fells  . TONSILLECTOMY    . TOTAL KNEE ARTHROPLASTY Bilateral 10/09/2014   Procedure: BILATERAL TOTAL KNEE ARTHROPLASTY;  Surgeon: Gaynelle Arabian, MD;  Location[facility];  Service: Orthopedics;  Laterality: Bilateral;  with general anesthesia    Social History:  reports that  has never smoked. she has never used smokeless tobacco. She reports that she does not drink alcohol or use drugs.  Allergies:  No Known  Allergies  Medications: No current facility-administered medications for this encounter.    Current Outpatient Medications  Medication Sig Dispense Refill  . acetaminophen (TYLENOL) 325 MG tablet Take 2 tablets (650 mg total) by mouth every 6 (six) hours as needed for mild pain (or Fever >/= 101). (Patient not taking: Reported on 01/01/2015) 40 tablet 0  . albuterol (PROVENTIL HFA;VENTOLIN HFA) 108 (90 Base) MCG/ACT inhaler Inhale 1-2 puffs into the lungs every 6 (six) hours as needed for wheezing or shortness of breath. 1 Inhaler 0  . amLODipine-olmesartan (AZOR) 10-40 MG per tablet Take 1 tablet by mouth daily.    Marland Kitchen atorvastatin (LIPITOR) 10 MG tablet Take 10 mg by mouth every morning.     Marland Kitchen azithromycin (ZITHROMAX Z-PAK) 250 MG tablet Take 1 tablet (250 mg total) by mouth daily. 500mg  PO day 1, then 250mg  PO days 205 6 tablet 0  . benzonatate (TESSALON) 100 MG capsule Take 1 capsule (100 mg total) by mouth at bedtime as needed for cough. 10 capsule 0  . bisacodyl (DULCOLAX) 10 MG suppository Place 1 suppository (10 mg total) rectally daily as needed for moderate constipation. (Patient not taking: Reported on 01/01/2015) 12 suppository 0  . cetirizine (ZYRTEC ALLERGY) 10 MG tablet Take 1 tablet (10 mg total) by mouth daily. 30 tablet 1  . ciprofloxacin (CIPRO) 500 MG tablet Take 1 tablet (500 mg total) by mouth 2 (two) times daily. 8 tablet 0  . CycloSPORINE (RESTASIS OP) Place 1 drop into both  eyes daily as needed (dry eyes).     Marland Kitchen guaiFENesin (ROBITUSSIN) 100 MG/5ML liquid Take 5-10 mLs (100-200 mg total) by mouth every 4 (four) hours as needed for cough. 60 mL 0  . HYDROcodone-acetaminophen (NORCO/VICODIN) 5-325 MG per tablet Take 1-2 tablets by mouth every 4 (four) hours as needed for moderate pain. 65 tablet 0  . niacin 500 MG tablet Take 500 mg by mouth every morning.     . pantoprazole (PROTONIX) 40 MG tablet Take 40 mg by mouth every morning.     . predniSONE (STERAPRED UNI-PAK 21 TAB)  10 MG (21) TBPK tablet Take 1 tablet (10 mg total) by mouth daily. Day 1: take 6 tabs.  Day 2: 5 tabs  Day 3: 4 tabs  Day 4: 3 tabs  Day 5: 2 tabs  Day 6: 1 tab 21 tablet 0    No results found for this or any previous visit (from the past 48 hour(s)). No results found.  ROS: Pain with rom of the right upper extremity  Physical Exam:  Alert and oriented 72 y.o. female in no acute distress Cranial nerves 2-12 intact Cervical spine: full rom with no tenderness, nv intact distally Chest: active breath sounds bilaterally, no wheeze rhonchi or rales Heart: regular rate and rhythm, no murmur Abd: non tender non distended with active bowel sounds Hip is stable with rom  Right shoulder: small mass seen posterior near scapular spine nv intact distally Good rom of right shoulder without pain No rashes or edema No signs of infection  Assessment/Plan Assessment: mass to right posterior shoulder  Plan: Patient will undergo a right shoulder mass excision by Dr. Veverly Fells at [facility]. Risks benefits and expectations were discussed with the patient. Patient understand risks, benefits and expectations and wishes to proceed.  Merla Riches PA-C, MPAS [facility] is now [facility] [address], [place], [place], [place] [address] Phone: [phone] [email or web address] Facebook  [employer]

## 2017-06-27 NOTE — Pre-Procedure Instructions (Signed)
Tara Moreno  06/27/2017      [id number] [address], [place] [address] Phone: [phone] Fax: [phone]  [id number] [address], [address] [place] [place] [address] Phone: [phone] Fax: [phone]    Your procedure is scheduled on .Feb 22  Report to [facility] Admitting at 130 Pm.  Call this number if you have problems the morning of surgery:  [phone]   Remember:  Do not eat food or drink liquids after midnight.  Take these medicines the morning of surgery with A SIP OF WATER Albuterol inhaler if needed, Restasis eye drops, pantoprazole (Protonix)  Bring your inhalers with you on the day of surgery. Stop taking aspirin, BC's, Goody's, herbal medications, Fish Oil,Ibuprofen, Advil, Motrin, Aleve, Naproxen,  Vitamins   Do not wear jewelry, make-up or nail polish.  Do not wear lotions, powders, or perfumes, or deodorant.  Do not shave 48 hours prior to surgery.  Men may shave face and neck.  Do not bring valuables to the hospital.  [facility] is not responsible for any belongings or valuables.  Contacts, dentures or bridgework may not be worn into surgery.  Leave your suitcase in the car.  After surgery it may be brought to your room.  For patients admitted to the hospital, discharge time will be determined by your treatment team.  Patients discharged the day of surgery will not be allowed to drive home.    Special instructions:  [facility] - Preparing for Surgery  Before surgery, you can play an important role.  Because skin is not sterile, your skin needs to be as free of germs as possible.  You can reduce the number of germs on you skin by washing with CHG (chlorahexidine gluconate) soap before surgery.  CHG is an antiseptic cleaner which kills germs and bonds with the skin to continue killing  germs even after washing.  Please DO NOT use if you have an allergy to CHG or antibacterial soaps.  If your skin becomes reddened/irritated stop using the CHG and inform your nurse when you arrive at Short Stay.  Do not shave (including legs and underarms) for at least 48 hours prior to the first CHG shower.  You may shave your face.  Please follow these instructions carefully:   1.  Shower with CHG Soap the night before surgery and the morning of Surgery.  2.  If you choose to wash your hair, wash your hair first as usual with your       normal shampoo.  3.  After you shampoo, rinse your hair and body thoroughly to remove the                      Shampoo.  4.  Use CHG as you would any other liquid soap.  You can apply chg directly       to the skin and wash gently with scrungie or a clean washcloth.  5.  Apply the CHG Soap to your body ONLY FROM THE NECK DOWN.        Do not use on open wounds or open sores.  Avoid contact with your eyes,       ears, mouth and genitals (private parts).  Wash genitals (private parts)       with your normal soap.  6.  Wash thoroughly,  paying special attention to the area where your surgery        will be performed.  7.  Thoroughly rinse your body with warm water from the neck down.  8.  DO NOT shower/wash with your normal soap after using and rinsing off       the CHG Soap.  9.  Pat yourself dry with a clean towel.            10.  Wear clean pajamas.            11.  Place clean sheets on your bed the night of your first shower and do not        sleep with pets.  Day of Surgery  Do not apply any lotions/deoderants the morning of surgery.  Please wear clean clothes to the hospital/surgery center.     Please read over the following fact sheets that you were given. Pain Booklet, Coughing and Deep Breathing and Surgical Site Infection Prevention

## 2017-06-28 ENCOUNTER — Encounter (HOSPITAL_COMMUNITY)
Admission: RE | Admit: 2017-06-28 | Discharge: 2017-06-28 | Disposition: A | Discharge status: Home / Self Care | Payer: PPO | Source: Ambulatory Visit | Attending: Orthopedic Surgery | Admitting: Orthopedic Surgery

## 2017-06-28 ENCOUNTER — Other Ambulatory Visit: Payer: Self-pay

## 2017-06-28 ENCOUNTER — Encounter (HOSPITAL_COMMUNITY): Payer: Self-pay

## 2017-06-28 DIAGNOSIS — Z01812 Encounter for preprocedural laboratory examination: Secondary | ICD-10-CM | POA: Insufficient documentation

## 2017-06-28 DIAGNOSIS — I447 Left bundle-branch block, unspecified: Secondary | ICD-10-CM | POA: Diagnosis not present

## 2017-06-28 DIAGNOSIS — Z01818 Encounter for other preprocedural examination: Secondary | ICD-10-CM | POA: Diagnosis not present

## 2017-06-28 LAB — CBC
HCT: 43.1 % (ref 36.0–46.0)
Hemoglobin: 14.1 g/dL (ref 12.0–15.0)
MCH: 29.4 pg (ref 26.0–34.0)
MCHC: 32.7 g/dL (ref 30.0–36.0)
MCV: 90 fL (ref 78.0–100.0)
Platelets: 284 10*3/uL (ref 150–400)
RBC: 4.79 MIL/uL (ref 3.87–5.11)
RDW: 14.1 % (ref 11.5–15.5)
WBC: 7.4 10*3/uL (ref 4.0–10.5)

## 2017-06-28 LAB — BASIC METABOLIC PANEL
ANION GAP: 11 (ref 5–15)
BUN: 14 mg/dL (ref 6–20)
CALCIUM: 9.3 mg/dL (ref 8.9–10.3)
CO2: 24 mmol/L (ref 22–32)
Chloride: 104 mmol/L (ref 101–111)
Creatinine, Ser: 0.9 mg/dL (ref 0.44–1.00)
GFR calc Af Amer: >60 mL/min (ref >60)
GFR calc non Af Amer: >60 mL/min (ref >60)
GLUCOSE: 84 mg/dL (ref 65–99)
POTASSIUM: 4.1 mmol/L (ref 3.5–5.1)
SODIUM: 139 mmol/L (ref 135–145)

## 2017-06-28 NOTE — Progress Notes (Signed)
PCP is Dr. Vera Martinique Cardiologist is Dr Acie Fredrickson -but states she hadn't seen him since the stress test was done in 2014. States she had a card cath more than 20 years ago and everything was fine. Denies ever having an echo. Denies chest pain, cough, or fever.

## 2017-06-30 MED ORDER — CEFAZOLIN SODIUM 10 G IJ SOLR
3.0000 g | INTRAMUSCULAR | Status: DC
  Filled 2017-06-30 (×3): qty 3000

## 2017-07-01 ENCOUNTER — Other Ambulatory Visit: Payer: Self-pay

## 2017-07-01 ENCOUNTER — Ambulatory Visit (HOSPITAL_COMMUNITY): Payer: PPO | Admitting: Anesthesiology

## 2017-07-01 ENCOUNTER — Encounter (HOSPITAL_COMMUNITY)
Admission: RE | Disposition: A | Discharge status: Home / Self Care | Payer: Self-pay | Source: Ambulatory Visit | Attending: Orthopedic Surgery

## 2017-07-01 ENCOUNTER — Inpatient Hospital Stay (HOSPITAL_COMMUNITY)
Admission: RE | Admit: 2017-07-01 | Discharge: 2017-07-03 | DRG: 607 | Disposition: A | Discharge status: Home / Self Care | Payer: PPO | Source: Ambulatory Visit | Attending: Orthopedic Surgery | Admitting: Orthopedic Surgery

## 2017-07-01 ENCOUNTER — Encounter (HOSPITAL_COMMUNITY): Payer: Self-pay | Admitting: Certified Registered"

## 2017-07-01 DIAGNOSIS — I10 Essential (primary) hypertension: Secondary | ICD-10-CM | POA: Diagnosis present

## 2017-07-01 DIAGNOSIS — Z96611 Presence of right artificial shoulder joint: Secondary | ICD-10-CM | POA: Diagnosis present

## 2017-07-01 DIAGNOSIS — D1779 Benign lipomatous neoplasm of other sites: Secondary | ICD-10-CM | POA: Diagnosis present

## 2017-07-01 DIAGNOSIS — Z9089 Acquired absence of other organs: Secondary | ICD-10-CM

## 2017-07-01 DIAGNOSIS — K219 Gastro-esophageal reflux disease without esophagitis: Secondary | ICD-10-CM | POA: Diagnosis present

## 2017-07-01 DIAGNOSIS — Z96653 Presence of artificial knee joint, bilateral: Secondary | ICD-10-CM | POA: Diagnosis present

## 2017-07-01 DIAGNOSIS — E785 Hyperlipidemia, unspecified: Secondary | ICD-10-CM | POA: Diagnosis present

## 2017-07-01 DIAGNOSIS — D171 Benign lipomatous neoplasm of skin and subcutaneous tissue of trunk: Secondary | ICD-10-CM | POA: Diagnosis not present

## 2017-07-01 DIAGNOSIS — N179 Acute kidney failure, unspecified: Secondary | ICD-10-CM | POA: Diagnosis not present

## 2017-07-01 DIAGNOSIS — R2231 Localized swelling, mass and lump, right upper limb: Secondary | ICD-10-CM | POA: Diagnosis not present

## 2017-07-01 DIAGNOSIS — D2111 Benign neoplasm of connective and other soft tissue of right upper limb, including shoulder: Secondary | ICD-10-CM | POA: Diagnosis not present

## 2017-07-01 DIAGNOSIS — M1990 Unspecified osteoarthritis, unspecified site: Secondary | ICD-10-CM | POA: Diagnosis present

## 2017-07-01 DIAGNOSIS — Z90711 Acquired absence of uterus with remaining cervical stump: Secondary | ICD-10-CM

## 2017-07-01 DIAGNOSIS — M1710 Unilateral primary osteoarthritis, unspecified knee: Secondary | ICD-10-CM | POA: Diagnosis not present

## 2017-07-01 DIAGNOSIS — Z9889 Other specified postprocedural states: Secondary | ICD-10-CM

## 2017-07-01 HISTORY — PX: EXCISION MASS UPPER EXTREMETIES: SHX6704

## 2017-07-01 HISTORY — PX: SCAPULAR MASS EXCISION: SHX2379

## 2017-07-01 SURGERY — EXCISION MASS UPPER EXTREMITIES
Anesthesia: General | Laterality: Right

## 2017-07-01 MED ORDER — FENTANYL CITRATE (PF) 250 MCG/5ML IJ SOLN
INTRAMUSCULAR | Status: AC
  Filled 2017-07-01: qty 5

## 2017-07-01 MED ORDER — MIDAZOLAM HCL 2 MG/2ML IJ SOLN
INTRAMUSCULAR | Status: AC
  Filled 2017-07-01: qty 2

## 2017-07-01 MED ORDER — METOCLOPRAMIDE HCL 5 MG/ML IJ SOLN: 5.0000 mg | Freq: Three times a day (TID) | INTRAMUSCULAR | Status: DC | PRN

## 2017-07-01 MED ORDER — PROPOFOL 10 MG/ML IV BOLUS
INTRAVENOUS | Status: AC
  Filled 2017-07-01: qty 20

## 2017-07-01 MED ORDER — GLYCOPYRROLATE 0.2 MG/ML IJ SOLN
INTRAMUSCULAR | Status: DC | PRN
  Administered 2017-07-01: 0.2 mg via INTRAVENOUS

## 2017-07-01 MED ORDER — LECITHIN 1200 MG PO CAPS: 1200.0000 mg | ORAL_CAPSULE | Freq: Every day | ORAL | Status: DC

## 2017-07-01 MED ORDER — OXYCODONE HCL 5 MG PO TABS: 5.0000 mg | ORAL_TABLET | ORAL | 0 refills | Status: DC | PRN

## 2017-07-01 MED ORDER — CHLORHEXIDINE GLUCONATE 4 % EX LIQD: 60.0000 mL | Freq: Once | CUTANEOUS | Status: DC

## 2017-07-01 MED ORDER — LIDOCAINE 2% (20 MG/ML) 5 ML SYRINGE
INTRAMUSCULAR | Status: AC
  Filled 2017-07-01: qty 5

## 2017-07-01 MED ORDER — LIDOCAINE 2% (20 MG/ML) 5 ML SYRINGE
INTRAMUSCULAR | Status: DC | PRN
  Administered 2017-07-01: 60 mg via INTRAVENOUS

## 2017-07-01 MED ORDER — EPHEDRINE SULFATE-NACL 50-0.9 MG/10ML-% IV SOSY
PREFILLED_SYRINGE | INTRAVENOUS | Status: DC | PRN
  Administered 2017-07-01: 5 mg via INTRAVENOUS

## 2017-07-01 MED ORDER — MORPHINE SULFATE (PF) 2 MG/ML IV SOLN
2.0000 mg | INTRAVENOUS | Status: DC | PRN
  Administered 2017-07-01: 2 mg via INTRAVENOUS
  Filled 2017-07-01: qty 1

## 2017-07-01 MED ORDER — FENTANYL CITRATE (PF) 100 MCG/2ML IJ SOLN
INTRAMUSCULAR | Status: AC
  Administered 2017-07-01: 50 ug via INTRAVENOUS
  Filled 2017-07-01: qty 2

## 2017-07-01 MED ORDER — MIDAZOLAM HCL 5 MG/5ML IJ SOLN
INTRAMUSCULAR | Status: DC | PRN
  Administered 2017-07-01: 2 mg via INTRAVENOUS

## 2017-07-01 MED ORDER — CYCLOSPORINE 0.05 % OP EMUL
1.0000 [drp] | Freq: Two times a day (BID) | OPHTHALMIC | Status: DC | PRN
  Filled 2017-07-01: qty 1

## 2017-07-01 MED ORDER — LACTATED RINGERS IV SOLN
INTRAVENOUS | Status: DC | PRN
  Administered 2017-07-01 (×2): via INTRAVENOUS

## 2017-07-01 MED ORDER — ONDANSETRON HCL 4 MG/2ML IJ SOLN
INTRAMUSCULAR | Status: DC | PRN
  Administered 2017-07-01: 4 mg via INTRAVENOUS

## 2017-07-01 MED ORDER — FENTANYL CITRATE (PF) 100 MCG/2ML IJ SOLN
INTRAMUSCULAR | Status: DC | PRN
  Administered 2017-07-01 (×5): 50 ug via INTRAVENOUS

## 2017-07-01 MED ORDER — OXYCODONE HCL 5 MG PO TABS: 5.0000 mg | ORAL_TABLET | ORAL | Status: DC | PRN

## 2017-07-01 MED ORDER — CEFAZOLIN SODIUM-DEXTROSE 2-4 GM/100ML-% IV SOLN
2.0000 g | Freq: Four times a day (QID) | INTRAVENOUS | Status: AC
  Administered 2017-07-01 – 2017-07-03 (×6): 2 g via INTRAVENOUS
  Filled 2017-07-01 (×7): qty 100

## 2017-07-01 MED ORDER — DEXAMETHASONE SODIUM PHOSPHATE 4 MG/ML IJ SOLN
INTRAMUSCULAR | Status: DC | PRN
  Administered 2017-07-01: 8 mg via INTRAVENOUS

## 2017-07-01 MED ORDER — PROPOFOL 10 MG/ML IV BOLUS
INTRAVENOUS | Status: DC | PRN
  Administered 2017-07-01: 200 mg via INTRAVENOUS

## 2017-07-01 MED ORDER — LACTATED RINGERS IV SOLN
Freq: Once | INTRAVENOUS | Status: AC
  Administered 2017-07-01: 14:00:00 via INTRAVENOUS

## 2017-07-01 MED ORDER — SUGAMMADEX SODIUM 200 MG/2ML IV SOLN
INTRAVENOUS | Status: DC | PRN
  Administered 2017-07-01: 300 mg via INTRAVENOUS

## 2017-07-01 MED ORDER — NAPROXEN 250 MG PO TABS
500.0000 mg | ORAL_TABLET | Freq: Two times a day (BID) | ORAL | Status: DC | PRN
  Filled 2017-07-01: qty 2

## 2017-07-01 MED ORDER — ROCURONIUM BROMIDE 100 MG/10ML IV SOLN
INTRAVENOUS | Status: DC | PRN
  Administered 2017-07-01: 45 mg via INTRAVENOUS

## 2017-07-01 MED ORDER — FENTANYL CITRATE (PF) 100 MCG/2ML IJ SOLN
25.0000 ug | INTRAMUSCULAR | Status: DC | PRN
  Administered 2017-07-01 (×2): 50 ug via INTRAVENOUS

## 2017-07-01 MED ORDER — SODIUM CHLORIDE 0.9 % IV SOLN
INTRAVENOUS | Status: DC
  Administered 2017-07-01: 22:00:00 via INTRAVENOUS

## 2017-07-01 MED ORDER — ROCURONIUM BROMIDE 10 MG/ML (PF) SYRINGE
PREFILLED_SYRINGE | INTRAVENOUS | Status: AC
  Filled 2017-07-01: qty 5

## 2017-07-01 MED ORDER — ATORVASTATIN CALCIUM 10 MG PO TABS
10.0000 mg | ORAL_TABLET | Freq: Every day | ORAL | Status: DC
  Administered 2017-07-01 – 2017-07-02 (×2): 10 mg via ORAL
  Filled 2017-07-01 (×2): qty 1

## 2017-07-01 MED ORDER — PANTOPRAZOLE SODIUM 40 MG PO TBEC
40.0000 mg | DELAYED_RELEASE_TABLET | Freq: Every day | ORAL | Status: DC
  Administered 2017-07-01 – 2017-07-03 (×3): 40 mg via ORAL
  Filled 2017-07-01 (×3): qty 1

## 2017-07-01 MED ORDER — MEPERIDINE HCL 50 MG/ML IJ SOLN: 6.2500 mg | INTRAMUSCULAR | Status: DC | PRN

## 2017-07-01 MED ORDER — METOCLOPRAMIDE HCL 5 MG/ML IJ SOLN: 10.0000 mg | Freq: Once | INTRAMUSCULAR | Status: DC | PRN

## 2017-07-01 MED ORDER — METOCLOPRAMIDE HCL 5 MG PO TABS: 5.0000 mg | ORAL_TABLET | Freq: Three times a day (TID) | ORAL | Status: DC | PRN

## 2017-07-01 MED ORDER — METHOCARBAMOL 500 MG PO TABS
500.0000 mg | ORAL_TABLET | Freq: Four times a day (QID) | ORAL | Status: DC | PRN
  Administered 2017-07-01 – 2017-07-03 (×4): 500 mg via ORAL
  Filled 2017-07-01 (×4): qty 1

## 2017-07-01 MED ORDER — AMLODIPINE BESYLATE 10 MG PO TABS
10.0000 mg | ORAL_TABLET | Freq: Every day | ORAL | Status: DC
  Administered 2017-07-01 – 2017-07-03 (×3): 10 mg via ORAL
  Filled 2017-07-01 (×3): qty 1

## 2017-07-01 MED ORDER — ONDANSETRON HCL 4 MG/2ML IJ SOLN
INTRAMUSCULAR | Status: AC
  Filled 2017-07-01: qty 2

## 2017-07-01 MED ORDER — SUGAMMADEX SODIUM 200 MG/2ML IV SOLN
INTRAVENOUS | Status: AC
  Filled 2017-07-01: qty 2

## 2017-07-01 MED ORDER — ONDANSETRON HCL 4 MG/2ML IJ SOLN
4.0000 mg | Freq: Four times a day (QID) | INTRAMUSCULAR | Status: DC | PRN
Start: 2017-07-01 — End: 2017-07-03

## 2017-07-01 MED ORDER — METHOCARBAMOL 1000 MG/10ML IJ SOLN
500.0000 mg | Freq: Four times a day (QID) | INTRAVENOUS | Status: DC | PRN
  Filled 2017-07-01: qty 5

## 2017-07-01 MED ORDER — ACETAMINOPHEN 650 MG RE SUPP: 650.0000 mg | RECTAL | Status: DC | PRN

## 2017-07-01 MED ORDER — DEXTROSE 5 % IV SOLN
INTRAVENOUS | Status: DC | PRN
  Administered 2017-07-01: 3 g via INTRAVENOUS

## 2017-07-01 MED ORDER — OXYCODONE HCL 5 MG PO TABS
10.0000 mg | ORAL_TABLET | ORAL | Status: DC | PRN
  Administered 2017-07-01 – 2017-07-03 (×3): 10 mg via ORAL
  Filled 2017-07-01 (×3): qty 2

## 2017-07-01 MED ORDER — PHENYLEPHRINE 40 MCG/ML (10ML) SYRINGE FOR IV PUSH (FOR BLOOD PRESSURE SUPPORT)
PREFILLED_SYRINGE | INTRAVENOUS | Status: DC | PRN
  Administered 2017-07-01 (×4): 80 ug via INTRAVENOUS

## 2017-07-01 MED ORDER — IRBESARTAN 300 MG PO TABS
300.0000 mg | ORAL_TABLET | Freq: Every day | ORAL | Status: DC
  Administered 2017-07-01 – 2017-07-03 (×3): 300 mg via ORAL
  Filled 2017-07-01 (×3): qty 1

## 2017-07-01 MED ORDER — ALBUTEROL SULFATE (2.5 MG/3ML) 0.083% IN NEBU: 2.5000 mg | INHALATION_SOLUTION | Freq: Four times a day (QID) | RESPIRATORY_TRACT | Status: DC | PRN

## 2017-07-01 MED ORDER — SODIUM CHLORIDE 0.9 % IR SOLN
Status: DC | PRN
  Administered 2017-07-01: 1000 mL

## 2017-07-01 MED ORDER — ARTIFICIAL TEARS OPHTHALMIC OINT
TOPICAL_OINTMENT | OPHTHALMIC | Status: DC | PRN
  Administered 2017-07-01: 1 via OPHTHALMIC

## 2017-07-01 MED ORDER — BUPIVACAINE-EPINEPHRINE (PF) 0.5% -1:200000 IJ SOLN
INTRAMUSCULAR | Status: AC
  Filled 2017-07-01: qty 30

## 2017-07-01 MED ORDER — AMLODIPINE-OLMESARTAN 10-40 MG PO TABS: 1.0000 | ORAL_TABLET | Freq: Every day | ORAL | Status: DC

## 2017-07-01 MED ORDER — ONDANSETRON HCL 4 MG PO TABS: 4.0000 mg | ORAL_TABLET | Freq: Four times a day (QID) | ORAL | Status: DC | PRN

## 2017-07-01 MED ORDER — ACETAMINOPHEN 325 MG PO TABS
650.0000 mg | ORAL_TABLET | ORAL | Status: DC | PRN
  Administered 2017-07-02: 650 mg via ORAL
  Filled 2017-07-01: qty 2

## 2017-07-01 MED ORDER — BUPIVACAINE-EPINEPHRINE (PF) 0.5% -1:200000 IJ SOLN
INTRAMUSCULAR | Status: DC | PRN
  Administered 2017-07-01: 10 mL via PERINEURAL

## 2017-07-01 MED ORDER — ADULT MULTIVITAMIN W/MINERALS CH
1.0000 | ORAL_TABLET | Freq: Every day | ORAL | Status: DC
  Administered 2017-07-01 – 2017-07-03 (×3): 1 via ORAL
  Filled 2017-07-01 (×3): qty 1

## 2017-07-01 MED ORDER — DEXAMETHASONE SODIUM PHOSPHATE 10 MG/ML IJ SOLN
INTRAMUSCULAR | Status: AC
  Filled 2017-07-01: qty 1

## 2017-07-01 SURGICAL SUPPLY — 45 items
CLSR STERI-STRIP ANTIMIC 1/2X4 (GAUZE/BANDAGES/DRESSINGS) ×1 IMPLANT
COVER SURGICAL LIGHT HANDLE (MISCELLANEOUS) ×2 IMPLANT
DECANTER SPIKE VIAL GLASS SM (MISCELLANEOUS) ×1 IMPLANT
DRAIN CHANNEL 10F 3/8 F FF (DRAIN) ×1 IMPLANT
DRAPE LAPAROTOMY TRNSV 102X78 (DRAPE) ×1 IMPLANT
DRSG ADAPTIC 3X8 NADH LF (GAUZE/BANDAGES/DRESSINGS) ×1 IMPLANT
DRSG PAD ABDOMINAL 8X10 ST (GAUZE/BANDAGES/DRESSINGS) ×4 IMPLANT
DURAPREP 26ML APPLICATOR (WOUND CARE) ×2 IMPLANT
ELECT REM PT RETURN 9FT ADLT (ELECTROSURGICAL)
ELECTRODE REM PT RTRN 9FT ADLT (ELECTROSURGICAL) IMPLANT
EVACUATOR 1/8 PVC DRAIN (DRAIN) ×2 IMPLANT
EVACUATOR SILICONE 100CC (DRAIN) ×1 IMPLANT
GAUZE SPONGE 4X4 12PLY STRL (GAUZE/BANDAGES/DRESSINGS) ×2 IMPLANT
GAUZE SPONGE 4X4 12PLY STRL LF (GAUZE/BANDAGES/DRESSINGS) ×1 IMPLANT
GLOVE BIOGEL PI ORTHO PRO 7.5 (GLOVE) ×1
GLOVE BIOGEL PI ORTHO PRO SZ8 (GLOVE) ×1
GLOVE ORTHO TXT STRL SZ7.5 (GLOVE) ×2 IMPLANT
GLOVE PI ORTHO PRO STRL 7.5 (GLOVE) ×1 IMPLANT
GLOVE PI ORTHO PRO STRL SZ8 (GLOVE) ×1 IMPLANT
GLOVE SURG ORTHO 8.5 STRL (GLOVE) ×2 IMPLANT
GLOVE SURG SS PI 6.5 STRL IVOR (GLOVE) ×2 IMPLANT
GOWN STRL REUS W/ TWL LRG LVL3 (GOWN DISPOSABLE) ×1 IMPLANT
GOWN STRL REUS W/ TWL XL LVL3 (GOWN DISPOSABLE) ×4 IMPLANT
GOWN STRL REUS W/TWL LRG LVL3 (GOWN DISPOSABLE) ×2
GOWN STRL REUS W/TWL XL LVL3 (GOWN DISPOSABLE) ×8
KIT BASIN OR (CUSTOM PROCEDURE TRAY) ×2 IMPLANT
KIT ROOM TURNOVER OR (KITS) ×2 IMPLANT
MANIFOLD NEPTUNE II (INSTRUMENTS) ×2 IMPLANT
NEEDLE HYPO 22GX1.5 SAFETY (NEEDLE) ×1 IMPLANT
NS IRRIG 1000ML POUR BTL (IV SOLUTION) ×2 IMPLANT
PACK GENERAL/GYN (CUSTOM PROCEDURE TRAY) ×1 IMPLANT
PAD ABD 8X10 STRL (GAUZE/BANDAGES/DRESSINGS) ×1 IMPLANT
PAD ARMBOARD 7.5X6 YLW CONV (MISCELLANEOUS) ×3 IMPLANT
SUT ETHILON 2 0 FS 18 (SUTURE) ×1 IMPLANT
SUT MNCRL AB 3-0 PS2 18 (SUTURE) ×3 IMPLANT
SUT VIC AB 0 CT1 27 (SUTURE) ×4
SUT VIC AB 0 CT1 27XBRD ANBCTR (SUTURE) ×1 IMPLANT
SUT VIC AB 2-0 CT1 27 (SUTURE) ×4
SUT VIC AB 2-0 CT1 TAPERPNT 27 (SUTURE) ×1 IMPLANT
SYR CONTROL 10ML LL (SYRINGE) ×1 IMPLANT
TAPE CLOTH SURG 6X10 WHT LF (GAUZE/BANDAGES/DRESSINGS) ×1 IMPLANT
TOWEL OR 17X24 6PK STRL BLUE (TOWEL DISPOSABLE) ×2 IMPLANT
TOWEL OR 17X26 10 PK STRL BLUE (TOWEL DISPOSABLE) ×2 IMPLANT
TUBE CONNECTING 12X1/4 (SUCTIONS) ×2 IMPLANT
UNDERPAD 30X30 (UNDERPADS AND DIAPERS) ×2 IMPLANT

## 2017-07-01 NOTE — Op Note (Signed)
Tara Moreno, Tara Moreno              ACCOUNT NO.:  [id number]  MEDICAL RECORD NO.:  [id number]  LOCATION:  SDS                          FACILITY:  [facility]  PHYSICIAN:  Doran Heater. Veverly Fells, M.D. DATE OF BIRTH:  01-21-1946  DATE OF PROCEDURE:  07/01/2017 DATE OF DISCHARGE:                              OPERATIVE REPORT   PREOPERATIVE DIAGNOSIS:  Right symptomatic periscapular mass.  POSTOPERATIVE DIAGNOSIS:  Right symptomatic periscapular mass.  PROCEDURE PERFORMED:  Right periscapular mass excision, deep.  ATTENDING SURGEON:  Doran Heater. Norelle Runnion, MD.  ASSISTANT:  Darol Destine, [place], who was present and scrubbed during the entire surgery and necessary for satisfactory retraction and exposing of the mass, which was very large and required multiple retractors.  ANESTHESIA:  General anesthesia was used.  ESTIMATED BLOOD LOSS:  Minimal.  FLUID REPLACEMENT:  1000 mL of crystalloid.  INSTRUMENT COUNTS:  Correct.  COMPLICATIONS:  There were no complications.  Perioperative antibiotics were given, and specimen was sent to Pathology for final path on a routine.  INDICATIONS:  The patient is a 72 year old female with an expanding right periscapular mass which was getting more and more painful.  Given the increase in size and its pain despite that it looked like a lipoma, recommendation was made for surgical excision and excisional biopsy and send that to Pathology.  Risks and benefits of surgery were discussed. Informed consent was obtained.  DESCRIPTION OF PROCEDURE:  After an adequate level of anesthesia was achieved, the patient was positioned prone.  All neurovascular structures were padded appropriately.  Airway was protected.  Jelly rolls were used under the hips and the shoulders.  The arms were just brought straight down to her sides.  We did a sterile prep and drape of the posterior right shoulder blade area.  A time-out was called verifying correct patient and correct site.   We then did a longitudinal skin incision over the palpable mass, which was very large, about the size of my hand posteriorly, lateral to the right side of the spine and just below the scapular spine.  Dissection was carried out down through subcutaneous tissues.  We identified a fascial covering over what appeared to be a very large lipoma.  We dissected with a combination of sharp dissection with scissors, Cobb elevator and also finger dissection and we were able to get the lipoma elevated off the muscular fascia deep to it and we were able to excise it in its entirety without disrupting the lipomatous mass at all.  There was a vascular leash descending into the muscular fascia below it and it seemed to be adhering a little bit to the muscle fascia, which was excised with the lipomatous mass.  Care was taken not to extend deep to the muscle and certainly superiorly to do direct dissection under direct visualization to avoid any injury to neurovascular structures.  Once the mass was out, it was sent for final pathology.  The wound was irrigated thoroughly and then we closed the dead space by suturing the deep subcutaneous tissue down to the muscle fascia and below that, we then placed a flat Jackson drain tunneling out through the skin below the mass area and once  we had the dead space closed and the drain placed, we did 2-0 Vicryl subcutaneous closure and a 4-0 running Monocryl for skin.  Steri-Strips applied followed by a sterile compressive bandage.  The patient was transported to the recovery room in stable condition.     Doran Heater. Veverly Fells, M.D.     SRN/MEDQ  D:  07/01/2017  T:  07/01/2017  Job:  [id number]

## 2017-07-01 NOTE — Transfer of Care (Signed)
Immediate Anesthesia Transfer of Care Note  Patient: SHAWN DANNENBERG  Procedure(s) Performed: RIGHT SCAPULAR MASS EXCISION (Right )  Patient Location: PACU  Anesthesia Type:General  Level of Consciousness: awake and patient cooperative  Airway & Oxygen Therapy: Patient Spontanous Breathing and Patient connected to face mask oxygen  Post-op Assessment: Report given to RN and Post -op Vital signs reviewed and stable  Post vital signs: Reviewed and stable  Last Vitals:  Vitals:   07/01/17 1812 07/01/17 1813  BP:  (!) 134/56  Pulse:  95  Resp:  (!) 26  Temp: 36.7 C   SpO2:  100%    Last Pain:  Vitals:   07/01/17 1812  TempSrc:   PainSc: Asleep      Patients Stated Pain Goal: 2 (00/76/22 6333)  Complications: No apparent anesthesia complications

## 2017-07-01 NOTE — Anesthesia Preprocedure Evaluation (Signed)
Anesthesia Evaluation  Patient identified by MRN, date of birth, ID band Patient awake    Reviewed: Allergy & Precautions, NPO status , Patient's Chart, lab work & pertinent test results  History of Anesthesia Complications (+) PONV  Airway Mallampati: II  TM Distance: >3 FB Neck ROM: Full    Dental no notable dental hx.    Pulmonary neg pulmonary ROS,    Pulmonary exam normal breath sounds clear to auscultation       Cardiovascular hypertension, Pt. on medications Normal cardiovascular exam Rhythm:Regular Rate:Normal     Neuro/Psych negative neurological ROS  negative psych ROS   GI/Hepatic Neg liver ROS, GERD  ,  Endo/Other  negative endocrine ROS  Renal/GU negative Renal ROS  negative genitourinary   Musculoskeletal negative musculoskeletal ROS (+)   Abdominal   Peds negative pediatric ROS (+)  Hematology negative hematology ROS (+)   Anesthesia Other Findings   Reproductive/Obstetrics negative OB ROS                             Anesthesia Physical Anesthesia Plan  ASA: II  Anesthesia Plan: General   Post-op Pain Management:    Induction: Intravenous  PONV Risk Score and Plan: 4 or greater and Ondansetron  Airway Management Planned: Oral ETT and LMA  Additional Equipment:   Intra-op Plan:   Post-operative Plan: Extubation in OR  Informed Consent: I have reviewed the patients History and Physical, chart, labs and discussed the procedure including the risks, benefits and alternatives for the proposed anesthesia with the patient or authorized representative who has indicated his/her understanding and acceptance.   Dental advisory given  Plan Discussed with: CRNA  Anesthesia Plan Comments:         Anesthesia Quick Evaluation

## 2017-07-01 NOTE — Op Note (Deleted)
  The note originally documented on this encounter has been moved the the encounter in which it belongs.  

## 2017-07-01 NOTE — Interval H&P Note (Signed)
History and Physical Interval Note:  07/01/2017 4:34 PM  Tara Moreno  has presented today for surgery, with the diagnosis of right scapular symptomatic mass  The various methods of treatment have been discussed with the patient and family. After consideration of risks, benefits and other options for treatment, the patient has consented to  Procedure(s): RIGHT SCAPULAR MASS EXCISION (Right) as a surgical intervention .  The patient's history has been reviewed, patient examined, no change in status, stable for surgery.  I have reviewed the patient's chart and labs.  Questions were answered to the patient's satisfaction.     Teondra Newburg,STEVEN R

## 2017-07-01 NOTE — Brief Op Note (Signed)
07/01/2017  6:03 PM  PATIENT:  Tara Moreno  72 y.o. female  PRE-OPERATIVE DIAGNOSIS:  right scapular symptomatic mass  POST-OPERATIVE DIAGNOSIS:  right scapular symptomatic mass  PROCEDURE:  Procedure(s): RIGHT SCAPULAR MASS EXCISION (Right) likely large lipoma  SURGEON:  Surgeon(s) and Role:    Netta Cedars, MD - Primary  PHYSICIAN ASSISTANT:   ASSISTANTS: Ventura Bruns, PA-C   ANESTHESIA:   general  EBL:  minimal   BLOOD ADMINISTERED:none  DRAINS: (Flat) Jackson-Pratt drain(s) with closed bulb suction in the posterior right scapular area   LOCAL MEDICATIONS USED:  MARCAINE     SPECIMEN:  Source of Specimen:  soft tissue mass right periscapular area  DISPOSITION OF SPECIMEN:  PATHOLOGY  COUNTS:  YES  TOURNIQUET:  * No tourniquets in log *  DICTATION: .Other Dictation: Dictation Number [id number]  PLAN OF CARE: Admit for overnight observation  PATIENT DISPOSITION:  PACU - hemodynamically stable.   Delay start of Pharmacological VTE agent (>24hrs) due to surgical blood loss or risk of bleeding: not applicable

## 2017-07-01 NOTE — Discharge Instructions (Signed)
Minimize strenuous activity with the upper body for one week.  Ice to the posterior right shoulder as you can tolerate.   Follow up in the office in two weeks.  Keep the incision clean and dry and intact for one week, then ok to get it wet in the shower.  Please have nurses change bandage to Aquacel prior to leaving and get one more to change to on Tuesday

## 2017-07-01 NOTE — Anesthesia Procedure Notes (Signed)
Procedure Name: Intubation Date/Time: 07/01/2017 4:52 PM Performed by: Orlie Dakin, CRNA Pre-anesthesia Checklist: Patient identified, Emergency Drugs available, Suction available, Patient being monitored and Timeout performed Patient Re-evaluated:Patient Re-evaluated prior to induction Oxygen Delivery Method: Circle system utilized Preoxygenation: Pre-oxygenation with 100% oxygen Induction Type: IV induction Ventilation: Mask ventilation without difficulty Laryngoscope Size: Miller and 3 Grade View: Grade II Tube type: Oral Tube size: 7.0 mm Number of attempts: 1 Airway Equipment and Method: Stylet Placement Confirmation: ETT inserted through vocal cords under direct vision,  positive ETCO2 and breath sounds checked- equal and bilateral Secured at: 23 cm Tube secured with: Tape Dental Injury: Teeth and Oropharynx as per pre-operative assessment  Comments: Noted floppy epiglottis with DL. 4x4s bite block used at end of proc prior to extubation.

## 2017-07-02 DIAGNOSIS — K219 Gastro-esophageal reflux disease without esophagitis: Secondary | ICD-10-CM | POA: Diagnosis present

## 2017-07-02 DIAGNOSIS — Z9089 Acquired absence of other organs: Secondary | ICD-10-CM | POA: Diagnosis not present

## 2017-07-02 DIAGNOSIS — D1779 Benign lipomatous neoplasm of other sites: Secondary | ICD-10-CM | POA: Diagnosis present

## 2017-07-02 DIAGNOSIS — Z96611 Presence of right artificial shoulder joint: Secondary | ICD-10-CM | POA: Diagnosis present

## 2017-07-02 DIAGNOSIS — E785 Hyperlipidemia, unspecified: Secondary | ICD-10-CM | POA: Diagnosis present

## 2017-07-02 DIAGNOSIS — Z90711 Acquired absence of uterus with remaining cervical stump: Secondary | ICD-10-CM | POA: Diagnosis not present

## 2017-07-02 DIAGNOSIS — I10 Essential (primary) hypertension: Secondary | ICD-10-CM | POA: Diagnosis present

## 2017-07-02 DIAGNOSIS — Z96653 Presence of artificial knee joint, bilateral: Secondary | ICD-10-CM | POA: Diagnosis present

## 2017-07-02 DIAGNOSIS — M1990 Unspecified osteoarthritis, unspecified site: Secondary | ICD-10-CM | POA: Diagnosis present

## 2017-07-02 NOTE — Progress Notes (Signed)
Pt. from PACU 1957 to 5N- checked in room by Victoria,RN; pt. alert and oriented x4 and resting in bed; family with pt.; oriented to room and call button.

## 2017-07-02 NOTE — Anesthesia Postprocedure Evaluation (Signed)
Anesthesia Post Note  Patient: Tara Moreno  Procedure(s) Performed: RIGHT SCAPULAR MASS EXCISION (Right )     Patient location during evaluation: PACU Anesthesia Type: General Level of consciousness: awake and alert Pain management: pain level controlled Vital Signs Assessment: post-procedure vital signs reviewed and stable Respiratory status: spontaneous breathing, nonlabored ventilation, respiratory function stable and patient connected to nasal cannula oxygen Cardiovascular status: blood pressure returned to baseline and stable Postop Assessment: no apparent nausea or vomiting Anesthetic complications: no    Last Vitals:  Vitals:   07/02/17 0018 07/02/17 0411  BP: (!) 146/77 (!) 144/83  Pulse: 76 79  Resp:    Temp: 36.6 C 36.7 C  SpO2: 98% 94%    Last Pain:  Vitals:   07/02/17 0424  TempSrc:   PainSc: [place]

## 2017-07-02 NOTE — Progress Notes (Signed)
Subjective: 1 Day Post-Op Procedure(s) (LRB): RIGHT SCAPULAR MASS EXCISION (Right) Patient reports pain as 3 on 0-10 scaleShe continues to actively drain from her wound site. Her drain is intact. I feel that it is best to keep her another day. .    Objective: Vital signs in last 24 hours: Temp:  [97.9 F (36.6 C)-98 F (36.7 C)] 98 F (36.7 C) (02/23 0818) Pulse Rate:  [51-95] 68 (02/23 0818) Resp:  [7-26] 16 (02/22 1927) BP: (122-156)/(56-86) 122/60 (02/23 0818) SpO2:  [84 %-100 %] 94 % (02/23 0818) Weight:  [121.1 kg (267 lb)] 121.1 kg (267 lb) (02/22 1347)  Intake/Output from previous day: 02/22 0701 - 02/23 0700 In: 2000 [I.V.:2000] Out: 40 [Drains:40] Intake/Output this shift: No intake/output data recorded.  No results for input(s): HGB in the last 72 hours. No results for input(s): WBC, RBC, HCT, PLT in the last 72 hours. No results for input(s): NA, K, CL, CO2, BUN, CREATININE, GLUCOSE, CALCIUM in the last 72 hours. No results for input(s): LABPT, INR in the last 72 hours.  Her drainage is still very active.  Assessment/Plan: 1 Day Post-Op Procedure(s) (LRB): RIGHT SCAPULAR MASS EXCISION (Right) Up with therapy  Latanya Maudlin 07/02/2017, 8:57 AM

## 2017-07-02 NOTE — Plan of Care (Signed)
  Education: Knowledge of General Education information will improve 07/02/2017 0524 - Progressing by Anson Fret, RN Note POC and orders reviewed with pt.

## 2017-07-03 ENCOUNTER — Encounter (HOSPITAL_COMMUNITY): Payer: Self-pay | Admitting: Orthopedic Surgery

## 2017-07-03 NOTE — Discharge Summary (Signed)
Orthopedic Discharge Summary        Physician Discharge Summary  Patient ID: Tara Moreno MRN: [id number] DOB/AGE: Nov 17, 1945 72 y.o.  Admit date: 07/01/2017 Discharge date: 07/03/2017   Procedures:  Procedure(s) (LRB): RIGHT SCAPULAR MASS EXCISION (Right)  Attending Physician:  Dr. Esmond Plants  Admission Diagnoses:   Right periscapular mass  Discharge Diagnoses:  Right periscapular mass   Past Medical History:  Diagnosis Date  . Arthritis    "thumb joints, was in my knees, in my back" (07/01/2017)  . Frequent urination at night   . GERD (gastroesophageal reflux disease)   . Hyperlipemia   . Hypertension   . PONV (postoperative nausea and vomiting)     PCP: Wallene Huh, MD   Discharged Condition: good  Hospital Course:  Patient underwent the above stated procedure on 07/01/2017. Patient tolerated the procedure well and brought to the recovery room in good condition and subsequently to the floor. Patient had an uncomplicated hospital course and was stable for discharge. Drain output decreased and the drain was pulled today prior to discharge.  The patient will need home health for daily dry dressing change   Disposition: 01-Home or Self Care with follow up in 2 weeks   Follow-up Information    Netta Cedars, MD. Call in 2 weeks.   Specialty:  Orthopedic Surgery Why:  [phone] Contact information: [address] [place] [address] [phone]           Discharge Instructions    Call MD / Call 911   Complete by:  As directed    If you experience chest pain or shortness of breath, CALL 911 and be transported to the hospital emergency room.  If you develope a fever above 101 F, pus (white drainage) or increased drainage or redness at the wound, or calf pain, call your surgeon's office.   Constipation Prevention   Complete by:  As directed    Drink plenty of fluids.  Prune juice may be helpful.  You may use a stool softener,  such as Colace (over the counter) 100 mg twice a day.  Use MiraLax (over the counter) for constipation as needed.   Diet - low sodium heart healthy   Complete by:  As directed    Increase activity slowly as tolerated   Complete by:  As directed       Allergies as of 07/03/2017   No Known Allergies     Medication List    TAKE these medications   ADRENAL PO Take 1 tablet by mouth daily. ADRENAL ADVANTAGE/ADRENAL SUPPORT   albuterol 108 (90 Base) MCG/ACT inhaler Commonly known as:  PROVENTIL HFA;VENTOLIN HFA Inhale 1-2 puffs into the lungs every 6 (six) hours as needed for wheezing or shortness of breath.   atorvastatin 10 MG tablet Commonly known as:  LIPITOR Take 10 mg by mouth daily.   AZOR 10-40 MG tablet Generic drug:  amLODipine-olmesartan Take 1 tablet by mouth daily.   cycloSPORINE 0.05 % ophthalmic emulsion Commonly known as:  RESTASIS Place 1 drop into both eyes 2 (two) times daily as needed (dry eyes).   DIGESTIVE ENZYME PO Take 1 capsule by mouth daily.   GREEN TEA EXTRACT PO Take 500 mg by mouth daily.   Lecithin 1200 MG Caps Take 1,200 mg by mouth daily.   MILK THISTLE EXTRACT PO Take 1 capsule by mouth daily.   multivitamin with minerals Tabs tablet Take 1 tablet by mouth daily. ULTRA MEGA MULTIVITAMIN TIME  RELEASE   naproxen sodium 220 MG tablet Commonly known as:  ALEVE Take 440 mg by mouth 2 (two) times daily as needed (for pain.).   oxyCODONE 5 MG immediate release tablet Commonly known as:  ROXICODONE Take 1 tablet (5 mg total) by mouth every 4 (four) hours as needed for severe pain.   pantoprazole 40 MG tablet Commonly known as:  PROTONIX Take 40 mg by mouth daily.   PROBIOTIC PO Take 1 capsule by mouth daily. ULTRA PROBIOTIC   SALMON OIL-1000 PO Take 2,000 mg by mouth daily.         Signed: Augustin Schooling 07/03/2017, 8:31 AM  [facility] is now [employer] [address], [place],  [place], [place] [address] Phone: [place]

## 2017-07-03 NOTE — Progress Notes (Signed)
Removed IV, provided discharge education/instructions, all questions and concerns addressed, discharged home with belongings accompanied by husband.

## 2017-07-03 NOTE — Progress Notes (Signed)
Orthopedics Progress Note  Subjective: Patient feeling better and ready for discharge.  Will need nursing help with bandage at home.  Objective:  Vitals:   07/02/17 1929 07/03/17 0354  BP: 134/71 (!) 109/94  Pulse: 77 87  Resp:  18  Temp: 99 F (37.2 C) 98.9 F (37.2 C)  SpO2: 99% 98%    General: Awake and alert  Musculoskeletal: dressing changed and drain pulled, no drainage and no erythema Neurovascularly intact  Lab Results  Component Value Date   WBC 7.4 06/28/2017   HGB 14.1 06/28/2017   HCT 43.1 06/28/2017   MCV 90.0 06/28/2017   PLT 284 06/28/2017       Component Value Date/Time   NA 139 06/28/2017 1545   K 4.1 06/28/2017 1545   CL 104 06/28/2017 1545   CO2 24 06/28/2017 1545   GLUCOSE 84 06/28/2017 1545   BUN 14 06/28/2017 1545   CREATININE 0.90 06/28/2017 1545   CALCIUM 9.3 06/28/2017 1545   GFRNONAA >60 06/28/2017 1545   GFRAA >60 06/28/2017 1545    Lab Results  Component Value Date   INR 1.85 (H) 10/15/2014   INR 1.71 (H) 10/14/2014   INR 1.47 10/13/2014    Assessment/Plan: POD #2  s/p Procedure(s): RIGHT SCAPULAR MASS EXCISION Face to face completed for home health for daily dry bandage change Discharge home after home health arranged  Doran Heater. Veverly Fells, MD 07/03/2017 8:25 AM

## 2017-07-03 NOTE — Care Management Note (Signed)
72 yo F s/p R scapular mass excision. Received referral to assist pt with [facility] RN for wound care.  Met with pt and husband. Pt plans to return home with the support of her family. She needs a 3-in-1 BSC. She reports that her insurance company gave her a list of in-network providers for [facility]. She prefers to use [facility] or [place]. Contacted Katina at [facility] for [facility] referral. Alwyn Ren stated that they can't take the referral due to staffing issues. Contacted [facility] at [phone], spoke to [place] and she stated she will notify the intake nurse about the referral. Received a call from [facility] who stated they are not able to accept the referral because they are short of nurses. Per pt's [place] list, [facility] is in-network. Pt agreed to use them if they are able to accept the referral. Contacted Jermaine at [facility] for [facility] referral and he accepted it. Contacted Reggie at [facility] for DME referral.

## 2017-07-03 NOTE — Plan of Care (Signed)
  Pain Managment: General experience of comfort will improve 07/03/2017 1007 - Progressing by Williams Che, RN   Safety: Ability to remain free from injury will improve 07/03/2017 1007 - Progressing by Williams Che, RN

## 2017-07-05 NOTE — Care Management (Addendum)
07/05/17 12:00Noon   Case manager received call from patient stating she had not heard from [facility] since being discharged, she made multiple calls to the office on 07/04/17 with no return call. Case manager contacted [place] and asked that he check status. Patient had been referred to [facility] because [facility] not staff case. CM contacted Adacia Schamberger with [facility] 07/05/17 at 12:11pm. Her office has processed patient and will contact her ASAP. Case Manager called Patient and provided her with the update.

## 2017-07-06 DIAGNOSIS — Z9181 History of falling: Secondary | ICD-10-CM | POA: Diagnosis not present

## 2017-07-06 DIAGNOSIS — Z48817 Encounter for surgical aftercare following surgery on the skin and subcutaneous tissue: Secondary | ICD-10-CM | POA: Diagnosis not present

## 2017-07-06 DIAGNOSIS — I10 Essential (primary) hypertension: Secondary | ICD-10-CM | POA: Diagnosis not present

## 2017-07-06 DIAGNOSIS — K219 Gastro-esophageal reflux disease without esophagitis: Secondary | ICD-10-CM | POA: Diagnosis not present

## 2017-07-06 DIAGNOSIS — Z96611 Presence of right artificial shoulder joint: Secondary | ICD-10-CM | POA: Diagnosis not present

## 2017-07-06 DIAGNOSIS — Z96612 Presence of left artificial shoulder joint: Secondary | ICD-10-CM | POA: Diagnosis not present

## 2017-07-06 DIAGNOSIS — M1710 Unilateral primary osteoarthritis, unspecified knee: Secondary | ICD-10-CM | POA: Diagnosis not present

## 2017-07-18 DIAGNOSIS — K2900 Acute gastritis without bleeding: Secondary | ICD-10-CM | POA: Diagnosis not present

## 2017-07-18 DIAGNOSIS — R112 Nausea with vomiting, unspecified: Secondary | ICD-10-CM | POA: Diagnosis not present

## 2017-07-18 DIAGNOSIS — A09 Infectious gastroenteritis and colitis, unspecified: Secondary | ICD-10-CM | POA: Diagnosis not present

## 2017-07-21 DIAGNOSIS — K2900 Acute gastritis without bleeding: Secondary | ICD-10-CM | POA: Diagnosis not present

## 2017-07-22 ENCOUNTER — Encounter: Payer: Self-pay | Admitting: Gastroenterology

## 2017-08-01 DIAGNOSIS — K589 Irritable bowel syndrome without diarrhea: Secondary | ICD-10-CM | POA: Diagnosis not present

## 2017-08-01 DIAGNOSIS — K219 Gastro-esophageal reflux disease without esophagitis: Secondary | ICD-10-CM | POA: Diagnosis not present

## 2017-08-01 DIAGNOSIS — Z0000 Encounter for general adult medical examination without abnormal findings: Secondary | ICD-10-CM | POA: Diagnosis not present

## 2017-08-01 DIAGNOSIS — J4521 Mild intermittent asthma with (acute) exacerbation: Secondary | ICD-10-CM | POA: Diagnosis not present

## 2017-08-01 DIAGNOSIS — M1990 Unspecified osteoarthritis, unspecified site: Secondary | ICD-10-CM | POA: Diagnosis not present

## 2017-08-01 DIAGNOSIS — T3695XA Adverse effect of unspecified systemic antibiotic, initial encounter: Secondary | ICD-10-CM | POA: Diagnosis not present

## 2017-08-01 DIAGNOSIS — E6609 Other obesity due to excess calories: Secondary | ICD-10-CM | POA: Diagnosis not present

## 2017-08-01 DIAGNOSIS — E785 Hyperlipidemia, unspecified: Secondary | ICD-10-CM | POA: Diagnosis not present

## 2017-08-01 DIAGNOSIS — I10 Essential (primary) hypertension: Secondary | ICD-10-CM | POA: Diagnosis not present

## 2017-08-01 DIAGNOSIS — Z6841 Body Mass Index (BMI) 40.0 and over, adult: Secondary | ICD-10-CM | POA: Diagnosis not present

## 2017-08-01 DIAGNOSIS — Z1389 Encounter for screening for other disorder: Secondary | ICD-10-CM | POA: Diagnosis not present

## 2017-08-18 DIAGNOSIS — Z96653 Presence of artificial knee joint, bilateral: Secondary | ICD-10-CM | POA: Diagnosis not present

## 2017-08-18 DIAGNOSIS — M170 Bilateral primary osteoarthritis of knee: Secondary | ICD-10-CM | POA: Diagnosis not present

## 2017-08-18 DIAGNOSIS — M1712 Unilateral primary osteoarthritis, left knee: Secondary | ICD-10-CM | POA: Diagnosis not present

## 2017-08-18 DIAGNOSIS — M1711 Unilateral primary osteoarthritis, right knee: Secondary | ICD-10-CM | POA: Diagnosis not present

## 2017-08-18 DIAGNOSIS — Z471 Aftercare following joint replacement surgery: Secondary | ICD-10-CM | POA: Diagnosis not present

## 2017-08-30 DIAGNOSIS — Z96612 Presence of left artificial shoulder joint: Secondary | ICD-10-CM | POA: Diagnosis not present

## 2017-08-30 DIAGNOSIS — Z471 Aftercare following joint replacement surgery: Secondary | ICD-10-CM | POA: Diagnosis not present

## 2017-08-30 DIAGNOSIS — Z96611 Presence of right artificial shoulder joint: Secondary | ICD-10-CM | POA: Diagnosis not present

## 2017-08-30 DIAGNOSIS — Z4789 Encounter for other orthopedic aftercare: Secondary | ICD-10-CM | POA: Diagnosis not present

## 2017-09-07 ENCOUNTER — Encounter: Payer: Self-pay | Admitting: Gastroenterology

## 2017-09-07 ENCOUNTER — Ambulatory Visit (INDEPENDENT_AMBULATORY_CARE_PROVIDER_SITE_OTHER): Payer: PPO | Admitting: Gastroenterology

## 2017-09-07 VITALS — BP 120/84 | HR 72 | Ht 67.0 in | Wt 267.8 lb

## 2017-09-07 DIAGNOSIS — Z86010 Personal history of colonic polyps: Secondary | ICD-10-CM | POA: Diagnosis not present

## 2017-09-07 DIAGNOSIS — R198 Other specified symptoms and signs involving the digestive system and abdomen: Secondary | ICD-10-CM

## 2017-09-07 NOTE — Patient Instructions (Addendum)
We will get records sent from your previous gastroenterologist([facility] Dr. Michail Sermon)  for review.  This will include any endoscopic (colonoscopy or upper endoscopy) procedures and any associated pathology reports.   Please start taking citrucel (orange flavored) powder fiber supplement.  This may cause some bloating at first but that usually goes away. Begin with a small spoonful and work your way up to a large, heaping spoonful daily over a week. Call in 4-5 weeks to report.  Normal BMI (Body Mass Index- based on height and weight) is between 23 and 30. Your BMI today is Body mass index is 41.94 kg/m. Marland Kitchen Please consider follow up  regarding your BMI with your Primary Care Provider.

## 2017-09-07 NOTE — Progress Notes (Signed)
HPI: This is a  very pleasant 72 year old woman who was referred to me by Leeroy Cha,*  to evaluate irregular bowel movements, constipation, history of colon polyps.    Chief complaint is history of colon polyps, irregular bowel habits  Had lipoma removed from her back 2-3 months ago.  About 2 weeks later she had vomiting and diarrhea acutely. Diarrhea persisted for about a week.  Vomiting quickly resolved.  There was blood on the TP when wiping.  Saw her PCP and rectal exam, felt it was hemorrhoidal related.  She had a stool culture and tells me this negative.    She has felt betting overall since then.  Knee surgery in 2016 and she had a similar constilation of symptoms afterwards.  She has alternating bowel habits (loose, scibbolous). Drinks a lot of water day.  She has tried linzess in the past, this will help for a week. She tried 'digestive enzyme" , fiber supplements  Weight overall stable, actually up a bit lately.  She had a colonoscopy 03/2015 Dr. Michail Sermon; (report not available at this point), she believes she had 3 polyps, diverticulosis and hemorrhoids.     Review of systems: Pertinent positive and negative review of systems were noted in the above HPI section. All other review negative.   Past Medical History:  Diagnosis Date  . Arthritis    "thumb joints, was in my knees, in my back" (07/01/2017)  . Frequent urination at night   . GERD (gastroesophageal reflux disease)   . Hyperlipemia   . Hypertension   . PONV (postoperative nausea and vomiting)     Past Surgical History:  Procedure Laterality Date  . ABDOMINAL HYSTERECTOMY     "partial"  . ADRENAL GLAND SURGERY Left 1990s   "tumor on my adrenal gland removed"  . AUGMENTATION MAMMAPLASTY  2000  . BUNIONECTOMY Left   . CARDIAC CATHETERIZATION     no PCI; had before renal tumor was removed  . COLONOSCOPY    . EXCISION MASS UPPER EXTREMETIES Right 07/01/2017   Procedure: RIGHT SCAPULAR MASS  EXCISION;  Surgeon: Netta Cedars, MD;  Location: [facility];  Service: Orthopedics;  Laterality: Right;  . JOINT REPLACEMENT    . REVERSE SHOULDER ARTHROPLASTY Right 03/08/2014   Procedure: RIGHT TOTAL SHOULDER ARTHROPLASTY VERSES A REVERSE ARTHROPLASTY;  Surgeon: Augustin Schooling, MD;  Location: [facility];  Service: Orthopedics;  Laterality: Right;  . REVERSE SHOULDER ARTHROPLASTY Left 07/05/2014   Procedure: LEFT REVERSE SHOULDER ARTHROPLASTY;  Surgeon: Augustin Schooling, MD;  Location: [facility];  Service: Orthopedics;  Laterality: Left;  . REVISION TOTAL SHOULDER ARTHROPLASTY Right 03/08/2014   dr Veverly Fells  . SCAPULAR MASS EXCISION Right 07/01/2017  . TONSILLECTOMY    . TOTAL KNEE ARTHROPLASTY Bilateral 10/09/2014   Procedure: BILATERAL TOTAL KNEE ARTHROPLASTY;  Surgeon: Gaynelle Arabian, MD;  Location: [facility];  Service: Orthopedics;  Laterality: Bilateral;  with general anesthesia  . TUBAL LIGATION      Current Outpatient Medications  Medication Sig Dispense Refill  . albuterol (PROVENTIL HFA;VENTOLIN HFA) 108 (90 Base) MCG/ACT inhaler Inhale 1-2 puffs into the lungs every 6 (six) hours as needed for wheezing or shortness of breath. 1 Inhaler 0  . amLODipine-olmesartan (AZOR) 10-40 MG per tablet Take 1 tablet by mouth daily.    Marland Kitchen atorvastatin (LIPITOR) 10 MG tablet Take 10 mg by mouth daily.     . cycloSPORINE (RESTASIS) 0.05 % ophthalmic emulsion Place 1 drop into both eyes 2 (two) times daily as needed (dry  eyes).    . Digestive Enzymes (DIGESTIVE ENZYME PO) Take 1 capsule by mouth daily.    Nyoka Cowden Tea, Camillia sinensis, (GREEN TEA EXTRACT PO) Take 500 mg by mouth daily.    . Lecithin 1200 MG CAPS Take 1,200 mg by mouth daily.    Marland Kitchen MILK THISTLE EXTRACT PO Take 1 capsule by mouth daily.    . Misc Natural Products (ADRENAL PO) Take 1 tablet by mouth daily. ADRENAL ADVANTAGE/ADRENAL SUPPORT    . Multiple Vitamin (MULTIVITAMIN WITH MINERALS) TABS tablet Take 1 tablet by mouth daily. ULTRA MEGA MULTIVITAMIN  TIME RELEASE    . naproxen sodium (ALEVE) 220 MG tablet Take 440 mg by mouth 2 (two) times daily as needed (for pain.).    Marland Kitchen Omega-3 Fatty Acids (SALMON OIL-1000 PO) Take 2,000 mg by mouth daily.    Marland Kitchen oxyCODONE (ROXICODONE) 5 MG immediate release tablet Take 1 tablet (5 mg total) by mouth every 4 (four) hours as needed for severe pain. 30 tablet 0  . pantoprazole (PROTONIX) 40 MG tablet Take 40 mg by mouth daily.     . Probiotic Product (PROBIOTIC PO) Take 1 capsule by mouth daily. ULTRA PROBIOTIC     No current facility-administered medications for this visit.     Allergies as of 09/07/2017  . (No Known Allergies)    Family History  Problem Relation Age of Onset  . Hypertension Mother   . Hypertension Father   . Hypertension Sister   . Hypertension Brother   . Hypertension Other     Social History   Socioeconomic History  . Marital status: Married    Spouse name: Not on file  . Number of children: Not on file  . Years of education: Not on file  . Highest education level: Not on file  Occupational History  . Not on file  Social Needs  . Financial resource strain: Not on file  . Food insecurity:    Worry: Not on file    Inability: Not on file  . Transportation needs:    Medical: Not on file    Non-medical: Not on file  Tobacco Use  . Smoking status: Never Smoker  . Smokeless tobacco: Never Used  Substance and Sexual Activity  . Alcohol use: No  . Drug use: No  . Sexual activity: Yes  Lifestyle  . Physical activity:    Days per week: Not on file    Minutes per session: Not on file  . Stress: Not on file  Relationships  . Social connections:    Talks on phone: Not on file    Gets together: Not on file    Attends religious service: Not on file    Active member of club or organization: Not on file    Attends meetings of clubs or organizations: Not on file    Relationship status: Not on file  . Intimate partner violence:    Fear of current or ex partner: Not on  file    Emotionally abused: Not on file    Physically abused: Not on file    Forced sexual activity: Not on file  Other Topics Concern  . Not on file  Social History Narrative  . Not on file     Physical Exam: BP 120/84   Pulse 72   Ht 5\' 7"  (1.702 m)   Wt 267 lb 12.8 oz (121.5 kg)   BMI 41.94 kg/m  Constitutional: generally well-appearing Psychiatric: alert and oriented x3 Eyes: extraocular movements intact  Mouth: oral pharynx moist, no lesions Neck: supple no lymphadenopathy Cardiovascular: heart regular rate and rhythm Lungs: clear to auscultation bilaterally Abdomen: soft, nontender, nondistended, no obvious ascites, no peritoneal signs, normal bowel sounds Extremities: no lower extremity edema bilaterally Skin: no lesions on visible extremities   Assessment and plan: 72 y.o. female with irregular bowel habits, history of colon polyps  First, we will [facility] to [facility] gastro[facility] to get records of her previous colonoscopy with Dr. Michail Sermon that was about 2-1/2 years ago.  She tells me she had 3 polyps removed.  I reviewed the results, pathology reports and let her know about timing of next surveillance examination.  Second she has alternating bowel habits, constipation predominant.  She has tried fiber supplements in the past but I am not sure if her trial was long enough.  I recommended she restart fiber supplements with Citrucel powder fiber supplement.  She will continue drinking a lot of water daily to stay hydrated as well.  She will call the office in 4 to 5 weeks to report on her response.    Please see the "Patient Instructions" section for addition details about the plan.   Owens Loffler, MD [facility] 09/07/2017, 11:23 AM  Cc: Leeroy Cha,*

## 2017-09-27 ENCOUNTER — Telehealth: Payer: Self-pay | Admitting: Gastroenterology

## 2017-09-27 NOTE — Telephone Encounter (Signed)
Colonoscopy Dr. Michail Sermon 03/2015 for routine risk screening. Findings: melanosis, 3 subCM polyps (one as an adenoma on path), hemorrhoids, diverticulosis.  He recommended repeat colonoscopy at 5-year interval and I agree with that.  Please call her, I reviewed the records from Dr. Michail Sermon.  She needs a recall colonoscopy November 2021.

## 2017-09-27 NOTE — Telephone Encounter (Signed)
The patient has been notified of this information and all questions answered.

## 2017-09-27 NOTE — Telephone Encounter (Signed)
Pt is returning your call

## 2017-09-27 NOTE — Telephone Encounter (Signed)
Recall in Epic Left message on machine to call back

## 2017-10-11 ENCOUNTER — Telehealth: Payer: Self-pay | Admitting: Gastroenterology

## 2017-10-11 NOTE — Telephone Encounter (Signed)
Dr Jacobs FYI 

## 2017-10-11 NOTE — Telephone Encounter (Signed)
Patient calling to give an update on symptoms. Patient states since starting medication she is doing great and not having any problems. FYI

## 2018-01-31 DIAGNOSIS — I10 Essential (primary) hypertension: Secondary | ICD-10-CM | POA: Diagnosis not present

## 2018-01-31 DIAGNOSIS — K219 Gastro-esophageal reflux disease without esophagitis: Secondary | ICD-10-CM | POA: Diagnosis not present

## 2018-01-31 DIAGNOSIS — E785 Hyperlipidemia, unspecified: Secondary | ICD-10-CM | POA: Diagnosis not present

## 2018-01-31 DIAGNOSIS — R739 Hyperglycemia, unspecified: Secondary | ICD-10-CM | POA: Diagnosis not present

## 2018-01-31 DIAGNOSIS — Z713 Dietary counseling and surveillance: Secondary | ICD-10-CM | POA: Diagnosis not present

## 2018-07-13 ENCOUNTER — Other Ambulatory Visit: Payer: Self-pay | Admitting: Internal Medicine

## 2018-07-13 DIAGNOSIS — Z1231 Encounter for screening mammogram for malignant neoplasm of breast: Secondary | ICD-10-CM

## 2018-08-14 ENCOUNTER — Ambulatory Visit: Payer: PPO

## 2018-10-30 ENCOUNTER — Other Ambulatory Visit: Payer: Self-pay | Admitting: *Deleted

## 2018-10-30 DIAGNOSIS — Z20822 Contact with and (suspected) exposure to covid-19: Secondary | ICD-10-CM

## 2018-11-05 LAB — NOVEL CORONAVIRUS, NAA: SARS-CoV-2, NAA: NOT DETECTED

## 2019-06-26 ENCOUNTER — Other Ambulatory Visit: Payer: Self-pay

## 2019-06-26 ENCOUNTER — Ambulatory Visit: Payer: Medicare Other | Admitting: Podiatry

## 2019-06-26 ENCOUNTER — Encounter: Payer: Self-pay | Admitting: Podiatry

## 2019-06-26 DIAGNOSIS — B351 Tinea unguium: Secondary | ICD-10-CM | POA: Insufficient documentation

## 2019-06-26 DIAGNOSIS — M79675 Pain in left toe(s): Secondary | ICD-10-CM | POA: Insufficient documentation

## 2019-06-26 DIAGNOSIS — M79674 Pain in right toe(s): Secondary | ICD-10-CM | POA: Diagnosis not present

## 2019-06-26 NOTE — Progress Notes (Signed)
This patient presents to the office stating that she has long ingrowing toenails especially on her big toes on both feet.  She says that the nails are painful walking and wearing her shoes.  She is unable to self treat.  She presents the office today for an evaluation and treatment of her long nails.  General Appearance  Alert, conversant and in no acute stress.  Vascular  Dorsalis pedis  are palpable  bilaterally. Posterior tibial pulses are absent  B/L.  Capillary return is within normal limits  bilaterally. Temperature is within normal limits  bilaterally.  Neurologic  Senn-Weinstein monofilament wire test within normal limits  bilaterally. Muscle power within normal limits bilaterally.  Nails Thick disfigured discolored nails with subungual debris  Hallux nails  B/L.Marland Kitchen No evidence of bacterial infection or drainage bilaterally.  Orthopedic  No limitations of motion  feet .  No crepitus or effusions noted.  No bony pathology or digital deformities noted.  Skin  normotropic skin with no porokeratosis noted bilaterally.  No signs of infections or ulcers noted.    Onychomycosis  Hallux  B/L  IE.  Diabetic foot exam performed with no nerve pathology and absent PT pulses  B/L.  RTC  Prn.   Gardiner Barefoot DPM

## 2019-08-17 ENCOUNTER — Ambulatory Visit
Admission: RE | Admit: 2019-08-17 | Discharge: 2019-08-17 | Disposition: A | Discharge status: Home / Self Care | Payer: Medicare Other | Source: Ambulatory Visit | Attending: Internal Medicine | Admitting: Internal Medicine

## 2019-08-17 ENCOUNTER — Other Ambulatory Visit: Payer: Self-pay | Admitting: Internal Medicine

## 2019-08-17 ENCOUNTER — Ambulatory Visit
Admission: RE | Admit: 2019-08-17 | Discharge: 2019-08-17 | Disposition: A | Discharge status: Home / Self Care | Payer: PPO | Source: Ambulatory Visit | Attending: Internal Medicine | Admitting: Internal Medicine

## 2019-08-17 ENCOUNTER — Other Ambulatory Visit: Payer: Self-pay

## 2019-08-17 DIAGNOSIS — Z1231 Encounter for screening mammogram for malignant neoplasm of breast: Secondary | ICD-10-CM

## 2019-08-17 DIAGNOSIS — M8580 Other specified disorders of bone density and structure, unspecified site: Secondary | ICD-10-CM

## 2019-10-05 ENCOUNTER — Other Ambulatory Visit: Payer: Self-pay | Admitting: Internal Medicine

## 2019-10-05 DIAGNOSIS — R6889 Other general symptoms and signs: Secondary | ICD-10-CM

## 2019-10-17 ENCOUNTER — Ambulatory Visit
Admission: RE | Admit: 2019-10-17 | Discharge: 2019-10-17 | Disposition: A | Discharge status: Home / Self Care | Payer: Medicare Other | Source: Ambulatory Visit | Attending: Internal Medicine | Admitting: Internal Medicine

## 2019-10-17 DIAGNOSIS — R6889 Other general symptoms and signs: Secondary | ICD-10-CM

## 2020-06-19 ENCOUNTER — Encounter: Payer: Self-pay | Admitting: Gastroenterology

## 2020-08-22 ENCOUNTER — Other Ambulatory Visit: Payer: Self-pay | Admitting: Internal Medicine

## 2020-08-22 DIAGNOSIS — Z1231 Encounter for screening mammogram for malignant neoplasm of breast: Secondary | ICD-10-CM

## 2020-09-08 DIAGNOSIS — H2513 Age-related nuclear cataract, bilateral: Secondary | ICD-10-CM | POA: Diagnosis not present

## 2020-09-08 DIAGNOSIS — H16223 Keratoconjunctivitis sicca, not specified as Sjogren's, bilateral: Secondary | ICD-10-CM | POA: Diagnosis not present

## 2020-09-08 DIAGNOSIS — H52203 Unspecified astigmatism, bilateral: Secondary | ICD-10-CM | POA: Diagnosis not present

## 2020-10-23 ENCOUNTER — Ambulatory Visit: Payer: Medicare Other

## 2020-10-27 ENCOUNTER — Other Ambulatory Visit: Payer: Self-pay

## 2020-10-27 ENCOUNTER — Other Ambulatory Visit: Payer: Self-pay | Admitting: Internal Medicine

## 2020-10-27 ENCOUNTER — Ambulatory Visit
Admission: RE | Admit: 2020-10-27 | Discharge: 2020-10-27 | Disposition: A | Discharge status: Home / Self Care | Payer: Medicare Other | Source: Ambulatory Visit | Attending: Internal Medicine | Admitting: Internal Medicine

## 2020-10-27 DIAGNOSIS — I10 Essential (primary) hypertension: Secondary | ICD-10-CM | POA: Diagnosis not present

## 2020-10-27 DIAGNOSIS — E896 Postprocedural adrenocortical (-medullary) hypofunction: Secondary | ICD-10-CM | POA: Diagnosis not present

## 2020-10-27 DIAGNOSIS — R7303 Prediabetes: Secondary | ICD-10-CM | POA: Diagnosis not present

## 2020-10-27 DIAGNOSIS — Z1231 Encounter for screening mammogram for malignant neoplasm of breast: Secondary | ICD-10-CM | POA: Diagnosis not present

## 2020-10-27 DIAGNOSIS — Z1389 Encounter for screening for other disorder: Secondary | ICD-10-CM | POA: Diagnosis not present

## 2020-10-27 DIAGNOSIS — Z0000 Encounter for general adult medical examination without abnormal findings: Secondary | ICD-10-CM | POA: Diagnosis not present

## 2020-10-27 DIAGNOSIS — L209 Atopic dermatitis, unspecified: Secondary | ICD-10-CM | POA: Diagnosis not present

## 2020-10-27 DIAGNOSIS — K219 Gastro-esophageal reflux disease without esophagitis: Secondary | ICD-10-CM | POA: Diagnosis not present

## 2020-10-27 DIAGNOSIS — R7309 Other abnormal glucose: Secondary | ICD-10-CM | POA: Diagnosis not present

## 2020-10-27 DIAGNOSIS — Z7189 Other specified counseling: Secondary | ICD-10-CM | POA: Diagnosis not present

## 2020-10-27 DIAGNOSIS — K5900 Constipation, unspecified: Secondary | ICD-10-CM | POA: Diagnosis not present

## 2020-10-27 DIAGNOSIS — E785 Hyperlipidemia, unspecified: Secondary | ICD-10-CM | POA: Diagnosis not present

## 2020-10-27 DIAGNOSIS — Z1211 Encounter for screening for malignant neoplasm of colon: Secondary | ICD-10-CM | POA: Diagnosis not present

## 2020-10-28 ENCOUNTER — Other Ambulatory Visit: Payer: Self-pay | Admitting: Internal Medicine

## 2020-10-28 DIAGNOSIS — Z1231 Encounter for screening mammogram for malignant neoplasm of breast: Secondary | ICD-10-CM

## 2021-05-19 DIAGNOSIS — E785 Hyperlipidemia, unspecified: Secondary | ICD-10-CM | POA: Diagnosis not present

## 2021-05-19 DIAGNOSIS — I10 Essential (primary) hypertension: Secondary | ICD-10-CM | POA: Diagnosis not present

## 2021-05-19 DIAGNOSIS — Z8616 Personal history of COVID-19: Secondary | ICD-10-CM | POA: Diagnosis not present

## 2021-07-24 ENCOUNTER — Other Ambulatory Visit: Payer: Self-pay

## 2021-07-24 ENCOUNTER — Ambulatory Visit: Payer: Medicare Other | Admitting: Plastic Surgery

## 2021-07-24 VITALS — BP 135/75 | HR 78 | Ht 67.0 in | Wt 245.6 lb

## 2021-07-24 DIAGNOSIS — L910 Hypertrophic scar: Secondary | ICD-10-CM

## 2021-07-26 ENCOUNTER — Encounter: Payer: Self-pay | Admitting: Plastic Surgery

## 2021-07-26 NOTE — Progress Notes (Signed)
? ?Referring Provider ?Leeroy Cha, MD ?[address] ?[address] ?[place],  [place] [address]  ? ?CC:  ?Keloids ? ?Tara Moreno is an 76 y.o. female.  ?HPI: The patient is a 76 year old keloids.  She has 1 on her back on a shoulder and 1 on her left flank.  She has had good results with injection with Dr. [name]. ? ?No Known Allergies ? ?Outpatient Encounter Medications as of 07/24/2021  ?Medication Sig  ? albuterol (PROVENTIL HFA;VENTOLIN HFA) 108 (90 Base) MCG/ACT inhaler Inhale 1-2 puffs into the lungs every 6 (six) hours as needed for wheezing or shortness of breath.  ? amLODipine-olmesartan (AZOR) 10-40 MG tablet Take 1 tablet by mouth daily.  ? atorvastatin (LIPITOR) 10 MG tablet Take 10 mg by mouth daily.   ? Green Tea, Camillia sinensis, (GREEN TEA EXTRACT PO) Take 500 mg by mouth daily.  ? Lecithin 1200 MG CAPS Take 1,200 mg by mouth daily.  ? MILK THISTLE EXTRACT PO Take 1 capsule by mouth daily.  ? Multiple Vitamin (MULTIVITAMIN WITH MINERALS) TABS tablet Take 1 tablet by mouth daily. ULTRA MEGA MULTIVITAMIN TIME RELEASE  ? naproxen sodium (ALEVE) 220 MG tablet Take 440 mg by mouth 2 (two) times daily as needed (for pain.).  ? Omega-3 Fatty Acids (SALMON OIL-1000 PO) Take 2,000 mg by mouth daily.  ? pantoprazole (PROTONIX) 40 MG tablet Take 40 mg by mouth daily.   ? Probiotic Product (PROBIOTIC PO) Take 1 capsule by mouth daily. ULTRA PROBIOTIC  ? [DISCONTINUED] cycloSPORINE (RESTASIS) 0.05 % ophthalmic emulsion Place 1 drop into both eyes 2 (two) times daily as needed (dry eyes).  ? [DISCONTINUED] Digestive Enzymes (DIGESTIVE ENZYME PO) Take 1 capsule by mouth daily.  ? ?No facility-administered encounter medications on file as of 07/24/2021.  ?  ? ?Past Medical History:  ?Diagnosis Date  ? Arthritis   ? "thumb joints, was in my knees, in my back" (07/01/2017)  ? Frequent urination at night   ? GERD (gastroesophageal reflux disease)   ? Hyperlipemia   ? Hypertension   ? PONV (postoperative  nausea and vomiting)   ? ? ?Past Surgical History:  ?Procedure Laterality Date  ? ABDOMINAL HYSTERECTOMY    ? "partial"  ? ADRENAL GLAND SURGERY Left 1990s  ? "tumor on my adrenal gland removed"  ? AUGMENTATION MAMMAPLASTY  2000  ? BUNIONECTOMY Left   ? CARDIAC CATHETERIZATION    ? no PCI; had before renal tumor was removed  ? COLONOSCOPY    ? EXCISION MASS UPPER EXTREMETIES Right 07/01/2017  ? Procedure: RIGHT SCAPULAR MASS EXCISION;  Surgeon: Netta Cedars, MD;  Location: [facility];  Service: Orthopedics;  Laterality: Right;  ? JOINT REPLACEMENT    ? REVERSE SHOULDER ARTHROPLASTY Right 03/08/2014  ? Procedure: RIGHT TOTAL SHOULDER ARTHROPLASTY VERSES A REVERSE ARTHROPLASTY;  Surgeon: Augustin Schooling, MD;  Location: [facility];  Service: Orthopedics;  Laterality: Right;  ? REVERSE SHOULDER ARTHROPLASTY Left 07/05/2014  ? Procedure: LEFT REVERSE SHOULDER ARTHROPLASTY;  Surgeon: Augustin Schooling, MD;  Location: [facility];  Service: Orthopedics;  Laterality: Left;  ? REVISION TOTAL SHOULDER ARTHROPLASTY Right 03/08/2014  ? dr Veverly Fells  ? SCAPULAR MASS EXCISION Right 07/01/2017  ? TONSILLECTOMY    ? TOTAL KNEE ARTHROPLASTY Bilateral 10/09/2014  ? Procedure: BILATERAL TOTAL KNEE ARTHROPLASTY;  Surgeon: Gaynelle Arabian, MD;  Location: [facility];  Service: Orthopedics;  Laterality: Bilateral;  with general anesthesia  ? TUBAL LIGATION    ? ? ?Family History  ?Problem Relation  Age of Onset  ? Hypertension Mother   ? Hypertension Father   ? Hypertension Sister   ? Hypertension Brother   ? Hypertension Other   ? ? ?Social History  ? ?Social History Narrative  ? Not on file  ?  ? ?Review of Systems ?General: Denies fevers, chills, weight loss ?CV: Denies chest pain, shortness of breath, palpitations ? ? ?Physical Exam ?Vitals with BMI 07/24/2021 09/07/2017 07/03/2017  ?Height '5\' 7"'$  '5\' 7"'$  -  ?Weight 245 lbs 10 oz 267 lbs 13 oz -  ?BMI 38.46 41.93 -  ?Systolic 159 458 592  ?Diastolic 75 84 94  ?Pulse 78 72 87  ?  ?General:  No acute distress,  Alert and  oriented, Non-Toxic, Normal speech and affect ?Skin: Raised keloids on left back, anterior shoulder and left flank ? ?Assessment/Plan ?We discussed injection versus excision injection.  She has had good results with injection alone with Dr. [name] and she is interested in improving the itch of her keloids so injection alone is indicated.  2cc of kenalog 40 (50/50 with lidocaine with epi) injected to back keloids. ? ?Lennice Sites ?07/26/2021, 3:26 PM  ? ? ?  ?

## 2021-08-10 ENCOUNTER — Ambulatory Visit: Payer: Medicare Other | Admitting: Plastic Surgery

## 2021-08-10 ENCOUNTER — Encounter: Payer: Self-pay | Admitting: Plastic Surgery

## 2021-08-10 DIAGNOSIS — L910 Hypertrophic scar: Secondary | ICD-10-CM | POA: Diagnosis not present

## 2021-08-12 NOTE — Progress Notes (Signed)
Operative Note  ? ?DATE OF OPERATION: 08/12/2021 ? ?LOCATION:   Both arms anterior shoulder ? ?SURGICAL DEPARTMENT: Plastic Surgery ? ?PREOPERATIVE DIAGNOSES: Keloid ? ?POSTOPERATIVE DIAGNOSES:  same ? ?PROCEDURE:  ?Injection of right and left shoulder keloid ? ?SURGEON: Melene Plan. Oneal Biglow, MD ? ?ANESTHESIA:  Local ? ?COMPLICATIONS: None.  ? ?INDICATIONS FOR PROCEDURE:  ?The patient, Tara Moreno is a 76 y.o. female born on January 16, 1946, is here for treatment of itching and burning keloids ?MRN: [id number] ? ?CONSENT:  ?Informed consent was obtained directly from the patient. Risks, benefits and alternatives were fully discussed. Specific risks including but not limited to bleeding, infection, hematoma, seroma, scarring, pain, infection, wound healing problems, and need for further surgery were all discussed. The patient did have an ample opportunity to have questions answered to satisfaction.  ? ?DESCRIPTION OF PROCEDURE:  ?The area was prepped with alcohol.  2 cc of Kenalog 40 was mixed with 1% lidocaine with epinephrine.  27-gauge needle was used to inject the steroid with about equal amounts of the right arm and the left arm.  Almost the entire keloids were able to be injected. ? ?The patient tolerated the procedure well.  There were no complications. ?  ?

## 2021-09-25 ENCOUNTER — Institutional Professional Consult (permissible substitution): Payer: Medicare Other | Admitting: Plastic Surgery

## 2021-10-13 DIAGNOSIS — H2513 Age-related nuclear cataract, bilateral: Secondary | ICD-10-CM | POA: Diagnosis not present

## 2021-10-13 DIAGNOSIS — H5203 Hypermetropia, bilateral: Secondary | ICD-10-CM | POA: Diagnosis not present

## 2021-10-13 DIAGNOSIS — H04123 Dry eye syndrome of bilateral lacrimal glands: Secondary | ICD-10-CM | POA: Diagnosis not present

## 2021-10-13 DIAGNOSIS — H52203 Unspecified astigmatism, bilateral: Secondary | ICD-10-CM | POA: Diagnosis not present

## 2021-10-28 ENCOUNTER — Other Ambulatory Visit: Payer: Self-pay | Admitting: Internal Medicine

## 2021-10-28 ENCOUNTER — Ambulatory Visit
Admission: RE | Admit: 2021-10-28 | Discharge: 2021-10-28 | Disposition: A | Discharge status: Home / Self Care | Payer: Medicare Other | Source: Ambulatory Visit | Attending: Internal Medicine | Admitting: Internal Medicine

## 2021-10-28 DIAGNOSIS — M1990 Unspecified osteoarthritis, unspecified site: Secondary | ICD-10-CM | POA: Diagnosis not present

## 2021-10-28 DIAGNOSIS — J4520 Mild intermittent asthma, uncomplicated: Secondary | ICD-10-CM | POA: Diagnosis not present

## 2021-10-28 DIAGNOSIS — H9193 Unspecified hearing loss, bilateral: Secondary | ICD-10-CM | POA: Diagnosis not present

## 2021-10-28 DIAGNOSIS — Z1231 Encounter for screening mammogram for malignant neoplasm of breast: Secondary | ICD-10-CM | POA: Diagnosis not present

## 2021-10-28 DIAGNOSIS — R7303 Prediabetes: Secondary | ICD-10-CM | POA: Diagnosis not present

## 2021-10-28 DIAGNOSIS — Z1159 Encounter for screening for other viral diseases: Secondary | ICD-10-CM | POA: Diagnosis not present

## 2021-10-28 DIAGNOSIS — Z0000 Encounter for general adult medical examination without abnormal findings: Secondary | ICD-10-CM | POA: Diagnosis not present

## 2021-10-28 DIAGNOSIS — M8588 Other specified disorders of bone density and structure, other site: Secondary | ICD-10-CM

## 2021-10-28 DIAGNOSIS — R7309 Other abnormal glucose: Secondary | ICD-10-CM | POA: Diagnosis not present

## 2021-10-28 DIAGNOSIS — K589 Irritable bowel syndrome without diarrhea: Secondary | ICD-10-CM | POA: Diagnosis not present

## 2021-10-28 DIAGNOSIS — E896 Postprocedural adrenocortical (-medullary) hypofunction: Secondary | ICD-10-CM | POA: Diagnosis not present

## 2021-10-28 DIAGNOSIS — E785 Hyperlipidemia, unspecified: Secondary | ICD-10-CM | POA: Diagnosis not present

## 2021-10-28 DIAGNOSIS — I10 Essential (primary) hypertension: Secondary | ICD-10-CM | POA: Diagnosis not present

## 2021-11-12 DIAGNOSIS — H60392 Other infective otitis externa, left ear: Secondary | ICD-10-CM | POA: Diagnosis not present

## 2021-11-12 DIAGNOSIS — I10 Essential (primary) hypertension: Secondary | ICD-10-CM | POA: Diagnosis not present

## 2021-12-04 ENCOUNTER — Telehealth: Payer: Self-pay | Admitting: Gastroenterology

## 2021-12-04 NOTE — Telephone Encounter (Signed)
Tried to reach pt and line rings then someone answers but does not speak. Tried again to call pt and line rings busy.  Pt not due until 03/2023

## 2021-12-07 NOTE — Telephone Encounter (Signed)
Left message on machine to call back  

## 2021-12-08 NOTE — Telephone Encounter (Signed)
I have tried to reach pt on several occasions with no return call.  Will await further communication from pt.

## 2021-12-08 NOTE — Telephone Encounter (Signed)
The pt has returned call and has been advised of the recall colon being changed to 11/24.  The pt has been advised of the information and verbalized understanding.

## 2021-12-25 DIAGNOSIS — H905 Unspecified sensorineural hearing loss: Secondary | ICD-10-CM | POA: Diagnosis not present

## 2021-12-28 DIAGNOSIS — M25532 Pain in left wrist: Secondary | ICD-10-CM | POA: Diagnosis not present

## 2021-12-28 DIAGNOSIS — M25561 Pain in right knee: Secondary | ICD-10-CM | POA: Diagnosis not present

## 2021-12-28 DIAGNOSIS — M25512 Pain in left shoulder: Secondary | ICD-10-CM | POA: Diagnosis not present

## 2021-12-29 ENCOUNTER — Other Ambulatory Visit: Payer: Self-pay

## 2021-12-29 ENCOUNTER — Encounter (HOSPITAL_BASED_OUTPATIENT_CLINIC_OR_DEPARTMENT_OTHER): Payer: Self-pay | Admitting: Emergency Medicine

## 2021-12-29 ENCOUNTER — Emergency Department (HOSPITAL_BASED_OUTPATIENT_CLINIC_OR_DEPARTMENT_OTHER)
Admission: EM | Admit: 2021-12-29 | Discharge: 2021-12-29 | Disposition: A | Discharge status: Home / Self Care | Payer: Medicare Other | Attending: Emergency Medicine | Admitting: Emergency Medicine

## 2021-12-29 ENCOUNTER — Emergency Department (HOSPITAL_BASED_OUTPATIENT_CLINIC_OR_DEPARTMENT_OTHER): Payer: Medicare Other

## 2021-12-29 DIAGNOSIS — S0990XA Unspecified injury of head, initial encounter: Secondary | ICD-10-CM | POA: Insufficient documentation

## 2021-12-29 DIAGNOSIS — R42 Dizziness and giddiness: Secondary | ICD-10-CM | POA: Diagnosis not present

## 2021-12-29 DIAGNOSIS — I10 Essential (primary) hypertension: Secondary | ICD-10-CM | POA: Insufficient documentation

## 2021-12-29 DIAGNOSIS — W01198A Fall on same level from slipping, tripping and stumbling with subsequent striking against other object, initial encounter: Secondary | ICD-10-CM | POA: Diagnosis not present

## 2021-12-29 DIAGNOSIS — Z79899 Other long term (current) drug therapy: Secondary | ICD-10-CM | POA: Diagnosis not present

## 2021-12-29 NOTE — ED Notes (Signed)
Fell yesterday, went to [facility]. X-rays done yesterday. They recommended a CT of her head she refused yesterday but changed her mind today. Complaint of right knee, left shoulder and back left of head. No blood thinners.

## 2021-12-29 NOTE — ED Provider Notes (Signed)
[facility] EMERGENCY DEPT Provider Note   CSN: [id number] Arrival date & time: 12/29/21  2458     History  Chief Complaint  Patient presents with   [name]    Tara Moreno is a 76 y.o. female.   Fall  Patient presents after fall.  Fell yesterday when she slipped on some water and fell.  Reported left wrist fracture.  Seen at [facility].  However did hit her head.  Was feeling a little dizzy after which she had to the fall.  However states she was told she had to be seen for CT scan.  No real headache just feels little lightheaded.  No chest or abdominal pain.  Not on blood thinners.  No neck pain.    Past Medical History:  Diagnosis Date   Arthritis    "thumb joints, was in my knees, in my back" (07/01/2017)   Frequent urination at night    GERD (gastroesophageal reflux disease)    Hyperlipemia    Hypertension    PONV (postoperative nausea and vomiting)     Home Medications Prior to Admission medications   Medication Sig Start Date End Date Taking? Authorizing Provider  amLODipine-olmesartan (AZOR) 10-40 MG tablet Take 1 tablet by mouth daily.    [provider]  atorvastatin (LIPITOR) 10 MG tablet Take 10 mg by mouth daily.     [provider]  Eloise Levels, Camillia sinensis, (GREEN TEA EXTRACT PO) Take 500 mg by mouth daily.    [provider]  Lecithin 1200 MG CAPS Take 1,200 mg by mouth daily.    [provider]  MILK THISTLE EXTRACT PO Take 1 capsule by mouth daily.    [provider]  Multiple Vitamin (MULTIVITAMIN WITH MINERALS) TABS tablet Take 1 tablet by mouth daily. ULTRA MEGA MULTIVITAMIN TIME RELEASE    [provider]  Omega-3 Fatty Acids (SALMON OIL-1000 PO) Take 2,000 mg by mouth daily.    [provider]  pantoprazole (PROTONIX) 40 MG tablet Take 40 mg by mouth daily.     [provider]  Probiotic Product (PROBIOTIC PO) Take 1 capsule by mouth daily. ULTRA PROBIOTIC     [provider]      Allergies    Patient has no known allergies.    Review of Systems   Review of Systems  Physical Exam Updated Vital Signs BP (!) 151/81 (BP Location: Right Arm)   Pulse (!) 55   Temp (!) 97.2 F (36.2 C)   Resp 16   Ht '5\' 7"'$  (1.702 m)   Wt 115.7 kg   SpO2 100%   BMI 39.94 kg/m  Physical Exam Vitals and nursing note reviewed.  HENT:     Head: Normocephalic.  Cardiovascular:     Rate and Rhythm: Regular rhythm.  Abdominal:     Tenderness: There is no abdominal tenderness.  Musculoskeletal:     Cervical back: Neck supple. No tenderness.     Comments: Left wrist in splint.  Skin:    General: Skin is warm.     Capillary Refill: Capillary refill takes less than 2 seconds.  Neurological:     Mental Status: She is alert and oriented to person, place, and time.     ED Results / Procedures / Treatments   Labs (all labs ordered are listed, but only abnormal results are displayed) Labs Reviewed - No data to display  EKG None  Radiology CT Head Wo Contrast  Result Date: 12/29/2021 CLINICAL DATA:  Patient slipped and fell yesterday. Head injury without loss of consciousness. Dizziness. EXAM: CT HEAD WITHOUT CONTRAST TECHNIQUE: Contiguous axial images were obtained from the base of the skull through the vertex without intravenous contrast. RADIATION DOSE REDUCTION: This exam was performed according to the departmental dose-optimization program which includes automated exposure control, adjustment of the mA and/or kV according to patient size and/or use of iterative reconstruction technique. COMPARISON:  CT head 05/17/2011. FINDINGS: Brain: There is no evidence of acute intracranial hemorrhage, mass lesion, brain edema or extra-axial fluid collection. The ventricles and subarachnoid spaces are appropriately sized for age. There is no CT evidence of acute cortical infarction. Minimal low-density in the periventricular white matter, likely due to chronic  small vessel ischemic changes. Vascular: Mild intracranial vascular calcifications. No hyperdense vessel identified. Skull: Negative for fracture or focal lesion. Sinuses/Orbits: The visualized paranasal sinuses and mastoid air cells are clear. No orbital abnormalities are seen. Other: None. IMPRESSION: 1. No evidence of acute intracranial process or acute posttraumatic findings. 2. Probable minimal chronic periventricular white matter disease. Electronically Signed   By: Richardean Sale M.D.   On: 12/29/2021 12:14    Procedures Procedures    Medications Ordered in ED Medications - No data to display  ED Course/ Medical Decision Making/ A&P                           Medical Decision Making Amount and/or Complexity of Data Reviewed Radiology: ordered.   Patient with fall.  Hit head yesterday.  Some dizziness.  Potential head injury.  No other apparent injury besides the wrist which is already been treated.  Will get CT scan to evaluate for causes such as intracranial hemorrhage.  Head CT done and independently interpreted shows no bleed.  Head injury/concussion potentially as a cause of the dizziness.  Doubt anemia or other abnormality as cause of dizziness.  Will discharge home with outpatient follow-up as needed.       Final Clinical Impression(s) / ED Diagnoses Final diagnoses:  Minor head injury, initial encounter    Rx / DC Orders ED Discharge Orders     None         Davonna Belling, MD 12/29/21 1538

## 2021-12-29 NOTE — ED Triage Notes (Signed)
Pt slipped and fell yesterday. She was seen at [facility] yesterday and found to have a L wrist fx. Pt now c/o dizziness. Pt hit her head, denies LOC, no blood thinners.

## 2021-12-29 NOTE — ED Notes (Signed)
Discharge paperwork given and verbally understood. 

## 2022-01-05 DIAGNOSIS — M13842 Other specified arthritis, left hand: Secondary | ICD-10-CM | POA: Diagnosis not present

## 2022-01-05 DIAGNOSIS — G44209 Tension-type headache, unspecified, not intractable: Secondary | ICD-10-CM | POA: Diagnosis not present

## 2022-01-05 DIAGNOSIS — M62838 Other muscle spasm: Secondary | ICD-10-CM | POA: Diagnosis not present

## 2022-01-05 DIAGNOSIS — K5901 Slow transit constipation: Secondary | ICD-10-CM | POA: Diagnosis not present

## 2022-01-05 DIAGNOSIS — M25532 Pain in left wrist: Secondary | ICD-10-CM | POA: Diagnosis not present

## 2022-01-08 ENCOUNTER — Ambulatory Visit: Payer: Medicare Other | Admitting: Plastic Surgery

## 2022-01-08 DIAGNOSIS — L910 Hypertrophic scar: Secondary | ICD-10-CM

## 2022-01-09 NOTE — Progress Notes (Signed)
Operative Note   DATE OF OPERATION: 01/09/2022  LOCATION:    SURGICAL DEPARTMENT: Plastic Surgery  PREOPERATIVE DIAGNOSES:  right arm and right back keloid  POSTOPERATIVE DIAGNOSES:  same  PROCEDURE:  Steroid injection to the right arm and right back keloid  SURGEON: Melene Plan. Deontray Hunnicutt, MD  ANESTHESIA:  Local  COMPLICATIONS: None.   INDICATIONS FOR PROCEDURE:  The patient, Tara Moreno is a 76 y.o. female born on 1945/12/12, is here for treatment of right arm and right back keloid MRN: [id number]  CONSENT:  Informed consent was obtained directly from the patient. Risks, benefits and alternatives were fully discussed. Specific risks including but not limited to bleeding, infection, hematoma, seroma, scarring, pain, infection, wound healing problems, and need for further surgery were all discussed. The patient did have an ample opportunity to have questions answered to satisfaction.   DESCRIPTION OF PROCEDURE:  A total of 2 cc of Kenalog was injected in the right arm and right back keloid.  This was mixed one-to-one with 1% lidocaine with epinephrine.  The patient tolerated seizure well.  The patient tolerated the procedure well.  There were no complications.

## 2022-02-07 HISTORY — PX: FINGER DEBRIDEMENT: SHX1634

## 2022-02-11 DIAGNOSIS — L299 Pruritus, unspecified: Secondary | ICD-10-CM | POA: Diagnosis not present

## 2022-02-11 DIAGNOSIS — M26609 Unspecified temporomandibular joint disorder, unspecified side: Secondary | ICD-10-CM | POA: Diagnosis not present

## 2022-02-12 DIAGNOSIS — R61 Generalized hyperhidrosis: Secondary | ICD-10-CM | POA: Diagnosis not present

## 2022-02-12 DIAGNOSIS — R058 Other specified cough: Secondary | ICD-10-CM | POA: Diagnosis not present

## 2022-02-12 DIAGNOSIS — Z03818 Encounter for observation for suspected exposure to other biological agents ruled out: Secondary | ICD-10-CM | POA: Diagnosis not present

## 2022-02-12 DIAGNOSIS — I10 Essential (primary) hypertension: Secondary | ICD-10-CM | POA: Diagnosis not present

## 2022-02-12 DIAGNOSIS — E785 Hyperlipidemia, unspecified: Secondary | ICD-10-CM | POA: Diagnosis not present

## 2022-02-12 DIAGNOSIS — R079 Chest pain, unspecified: Secondary | ICD-10-CM | POA: Diagnosis not present

## 2022-02-13 ENCOUNTER — Encounter (HOSPITAL_BASED_OUTPATIENT_CLINIC_OR_DEPARTMENT_OTHER): Payer: Self-pay | Admitting: Emergency Medicine

## 2022-02-13 ENCOUNTER — Emergency Department (HOSPITAL_BASED_OUTPATIENT_CLINIC_OR_DEPARTMENT_OTHER)
Admission: EM | Admit: 2022-02-13 | Discharge: 2022-02-13 | Disposition: A | Discharge status: Home / Self Care | Payer: Medicare Other | Attending: Emergency Medicine | Admitting: Emergency Medicine

## 2022-02-13 ENCOUNTER — Emergency Department (HOSPITAL_BASED_OUTPATIENT_CLINIC_OR_DEPARTMENT_OTHER): Payer: Medicare Other

## 2022-02-13 ENCOUNTER — Other Ambulatory Visit: Payer: Self-pay

## 2022-02-13 DIAGNOSIS — Z79899 Other long term (current) drug therapy: Secondary | ICD-10-CM | POA: Insufficient documentation

## 2022-02-13 DIAGNOSIS — J9811 Atelectasis: Secondary | ICD-10-CM | POA: Diagnosis not present

## 2022-02-13 DIAGNOSIS — R0789 Other chest pain: Secondary | ICD-10-CM

## 2022-02-13 DIAGNOSIS — R079 Chest pain, unspecified: Secondary | ICD-10-CM | POA: Diagnosis not present

## 2022-02-13 DIAGNOSIS — U071 COVID-19: Secondary | ICD-10-CM | POA: Diagnosis not present

## 2022-02-13 LAB — CBC WITH DIFFERENTIAL/PLATELET
Abs Immature Granulocytes: 0.02 10*3/uL (ref 0.00–0.07)
Basophils Absolute: 0.1 10*3/uL (ref 0.0–0.1)
Basophils Relative: 1 %
Eosinophils Absolute: 0.1 10*3/uL (ref 0.0–0.5)
Eosinophils Relative: 1 %
HCT: 42.7 % (ref 36.0–46.0)
Hemoglobin: 14 g/dL (ref 12.0–15.0)
Immature Granulocytes: 0 %
Lymphocytes Relative: 23 %
Lymphs Abs: 2.1 10*3/uL (ref 0.7–4.0)
MCH: 30.3 pg (ref 26.0–34.0)
MCHC: 32.8 g/dL (ref 30.0–36.0)
MCV: 92.4 fL (ref 80.0–100.0)
Monocytes Absolute: 0.8 10*3/uL (ref 0.1–1.0)
Monocytes Relative: 9 %
Neutro Abs: 5.9 10*3/uL (ref 1.7–7.7)
Neutrophils Relative %: 66 %
Platelets: 261 10*3/uL (ref 150–400)
RBC: 4.62 MIL/uL (ref 3.87–5.11)
RDW: 13.8 % (ref 11.5–15.5)
WBC: 8.9 10*3/uL (ref 4.0–10.5)
nRBC: 0 % (ref 0.0–0.2)

## 2022-02-13 LAB — COMPREHENSIVE METABOLIC PANEL
ALT: 15 U/L (ref 0–44)
AST: 17 U/L (ref 15–41)
Albumin: 4.2 g/dL (ref 3.5–5.0)
Alkaline Phosphatase: 91 U/L (ref 38–126)
Anion gap: 11 (ref 5–15)
BUN: 18 mg/dL (ref 8–23)
CO2: 30 mmol/L (ref 22–32)
Calcium: 9.8 mg/dL (ref 8.9–10.3)
Chloride: 102 mmol/L (ref 98–111)
Creatinine, Ser: 1.06 mg/dL — ABNORMAL HIGH (ref 0.44–1.00)
GFR, Estimated: 55 mL/min — ABNORMAL LOW (ref >60)
Glucose, Bld: 104 mg/dL — ABNORMAL HIGH (ref 70–99)
Potassium: 4.6 mmol/L (ref 3.5–5.1)
Sodium: 143 mmol/L (ref 135–145)
Total Bilirubin: 0.6 mg/dL (ref 0.3–1.2)
Total Protein: 8 g/dL (ref 6.5–8.1)

## 2022-02-13 LAB — TROPONIN I (HIGH SENSITIVITY)
Troponin I (High Sensitivity): 4 ng/L (ref <18)
Troponin I (High Sensitivity): 3 ng/L (ref <18)

## 2022-02-13 MED ORDER — IOHEXOL 350 MG/ML SOLN
100.0000 mL | Freq: Once | INTRAVENOUS | Status: AC | PRN
  Administered 2022-02-13: 100 mL via INTRAVENOUS

## 2022-02-13 NOTE — ED Provider Notes (Signed)
[facility] EMERGENCY DEPT Provider Note   CSN: [id number] Arrival date & time: 02/13/22  2440     History  Chief Complaint  Patient presents with   Chest Pain    Tara Moreno is a 76 y.o. female.  The history is provided by the patient and medical records. No language interpreter was used.  Chest Pain Pain location:  Substernal area Pain quality: aching   Progression:  Resolved Chronicity:  New Relieved by:  Nothing Worsened by:  Nothing Ineffective treatments:  None tried Associated symptoms: shortness of breath (resolved)   Associated symptoms: no abdominal pain, no back pain, no cough (resolved), no diaphoresis, no fatigue, no fever, no headache, no lower extremity edema, no nausea, no near-syncope, no palpitations and no vomiting        Home Medications Prior to Admission medications   Medication Sig Start Date End Date Taking? Authorizing Provider  amLODipine-olmesartan (AZOR) 10-40 MG tablet Take 1 tablet by mouth daily.    [provider]  atorvastatin (LIPITOR) 10 MG tablet Take 10 mg by mouth daily.     [provider]  Eloise Levels, Camillia sinensis, (GREEN TEA EXTRACT PO) Take 500 mg by mouth daily.    [provider]  Lecithin 1200 MG CAPS Take 1,200 mg by mouth daily.    [provider]  MILK THISTLE EXTRACT PO Take 1 capsule by mouth daily.    [provider]  Multiple Vitamin (MULTIVITAMIN WITH MINERALS) TABS tablet Take 1 tablet by mouth daily. ULTRA MEGA MULTIVITAMIN TIME RELEASE    [provider]  Omega-3 Fatty Acids (SALMON OIL-1000 PO) Take 2,000 mg by mouth daily.    [provider]  pantoprazole (PROTONIX) 40 MG tablet Take 40 mg by mouth daily.     [provider]  Probiotic Product (PROBIOTIC PO) Take 1 capsule by mouth daily. ULTRA PROBIOTIC    [provider]      Allergies    Patient has no known allergies.    Review of Systems   Review of  Systems  Constitutional:  Negative for chills, diaphoresis, fatigue and fever.  HENT:  Negative for congestion.   Respiratory:  Positive for chest tightness and shortness of breath (resolved). Negative for cough (resolved) and wheezing.   Cardiovascular:  Positive for chest pain. Negative for palpitations and near-syncope.  Gastrointestinal:  Positive for diarrhea (resolving). Negative for abdominal pain, constipation, nausea and vomiting.  Genitourinary:  Negative for dysuria.  Musculoskeletal:  Negative for back pain, neck pain and neck stiffness.  Skin:  Negative for rash and wound.  Neurological:  Negative for light-headedness and headaches.  Psychiatric/Behavioral:  Negative for agitation.   All other systems reviewed and are negative.   Physical Exam Updated Vital Signs BP (!) 141/84   Pulse 67   Temp 97.9 F (36.6 C) (Oral)   Resp 20   SpO2 99%  Physical Exam Vitals and nursing note reviewed.  Constitutional:      General: She is not in acute distress.    Appearance: She is well-developed. She is not ill-appearing, toxic-appearing or diaphoretic.  HENT:     Head: Normocephalic and atraumatic.  Eyes:     Conjunctiva/sclera: Conjunctivae normal.     Pupils: Pupils are equal, round, and reactive to light.  Cardiovascular:     Rate and Rhythm: Normal rate and regular rhythm.     Heart sounds: Normal heart sounds. No murmur heard. Pulmonary:     Effort: Pulmonary effort  is normal. No respiratory distress.     Breath sounds: Normal breath sounds. No decreased breath sounds, wheezing, rhonchi or rales.  Chest:     Chest wall: No tenderness.  Abdominal:     Palpations: Abdomen is soft.     Tenderness: There is no abdominal tenderness.  Musculoskeletal:        General: No swelling.     Cervical back: Neck supple.     Right lower leg: No tenderness. No edema.     Left lower leg: No tenderness. No edema.  Skin:    General: Skin is warm and dry.     Capillary Refill:  Capillary refill takes less than 2 seconds.  Neurological:     General: No focal deficit present.     Mental Status: She is alert.  Psychiatric:        Mood and Affect: Mood normal.     ED Results / Procedures / Treatments   Labs (all labs ordered are listed, but only abnormal results are displayed) Labs Reviewed  COMPREHENSIVE METABOLIC PANEL - Abnormal; Notable for the following components:      Result Value   Glucose, Bld 104 (*)    Creatinine, Ser 1.06 (*)    GFR, Estimated 55 (*)    All other components within normal limits  CBC WITH DIFFERENTIAL/PLATELET  TROPONIN I (HIGH SENSITIVITY)  TROPONIN I (HIGH SENSITIVITY)    EKG EKG Interpretation  Date/Time:  Saturday February 13 2022 09:11:43 EDT Ventricular Rate:  71 PR Interval:  144 QRS Duration: 140 QT Interval:  424 QTC Calculation: 460 R Axis:   58 Text Interpretation: Normal sinus rhythm Left bundle branch block Abnormal ECG When compared with ECG of 28-Jun-2017 15:44, T wave inversion less evident in Lateral leads when compared to prior, similar appearance. No STEMI Confirmed by Antony Blackbird [phone]) on 02/13/2022 10:19:18 AM  Radiology CT Angio Chest PE W and/or Wo Contrast  Result Date: 02/13/2022 CLINICAL DATA:  76 year old female with chest pain and elevated D-dimer. Recent COVID. EXAM: CT ANGIOGRAPHY CHEST WITH CONTRAST TECHNIQUE: Multidetector CT imaging of the chest was performed using the standard protocol during bolus administration of intravenous contrast. Multiplanar CT image reconstructions and MIPs were obtained to evaluate the vascular anatomy. RADIATION DOSE REDUCTION: This exam was performed according to the departmental dose-optimization program which includes automated exposure control, adjustment of the mA and/or kV according to patient size and/or use of iterative reconstruction technique. CONTRAST:  133m OMNIPAQUE IOHEXOL 350 MG/ML SOLN COMPARISON:  01/01/2015 abdominal and pelvic CT. FINDINGS:  Cardiovascular: This is a technically adequate study. No pulmonary emboli are identified. Cardiomegaly again noted. A moderate pericardial effusion is slightly increased from 2016. Coronary artery and aortic atherosclerotic calcifications are again noted. There is no evidence of thoracic aortic aneurysm. Mediastinum/Nodes: No enlarged mediastinal, hilar, or axillary lymph nodes. Thyroid gland, trachea, and esophagus demonstrate no significant findings. Lungs/Pleura: Mild scattered ground-glass opacities bilaterally are nonspecific but likely related to recent COVID infection. Mild bibasilar atelectasis noted. There is no evidence of airspace disease, mass, definite nodule, pleural effusion or pneumothorax. Upper Abdomen: No acute abnormality. Musculoskeletal: No acute or suspicious bony abnormalities are noted. 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 Review of the MIP images confirms the above findings. IMPRESSION: 1. No evidence of pulmonary emboli or thoracic aortic aneurysm. 2. Mild scattered ground-glass opacities bilaterally, nonspecific but likely related to recent COVID infection. 3. Cardiomegaly and moderate  pericardial effusion, slightly increased from 2016. 4. Coronary artery disease and aortic Atherosclerosis (ICD10-I70.0). Electronically Signed   By: Margarette Canada M.D.   On: 02/13/2022 10:57    Procedures Procedures    Medications Ordered in ED Medications  iohexol (OMNIPAQUE) 350 MG/ML injection 100 mL (100 mLs Intravenous Contrast Given 02/13/22 1038)    ED Course/ Medical Decision Making/ A&P                           Medical Decision Making Amount and/or Complexity of Data Reviewed Labs: ordered. Radiology: ordered.  Risk Prescription drug management.    Tara Moreno is a 76 y.o. female with a past medical history significant for hypertension, hyperlipidemia, arthritis, GERD, and recent  diagnosis of COVID-19 2 weeks ago who presents with some chest discomfort yesterday prompting her to see her PCP who got a D-dimer that was positive and she was sent in for evaluation and PE rule out.  Patient reports that she had some mild chest discomfort the last few days that has essentially resolved but had a D-dimer that was positive yesterday so they sent her in to get a PE study to rule out pulmonary embolism.  She is denying current shortness of breath and current chest pain.  She is reporting no nausea, vomiting, constipation, or diarrhea.  She reports no symptoms of otherwise improved.  Denies any other complaints this time.  Is resting comfortably.  On my exam, lungs were clear and chest was nontender.  Abdomen nontender.  Patient moving all extremities.  Legs are nontender and nonedematous.  No murmur on my auscultation.  Patient has no acute injury seen.  Given the patient's reported positive D-dimer, will get CT PE study to rule out pulmonary embolism.  Her vital signs are reassuring on my evaluation with no hypoxia or significant respiratory distress.  She is not tachycardic or febrile here.  Patient had some screening labs in triage and her troponin was found to be negative x2.  Her CBC and metabolic panel overall reassuring as well.  CT PE study did not show pulmonary embolism or dissection and showed some changes like related to recent COVID infection.  Patient was monitored for several hours and without any recurrence of symptoms or other complaints, we feel she is safe for discharge home after her PE study was negative.  She will follow-up with PCP and understood return precautions.  She no other questions or concerns and was discharged in good condition.            Final Clinical Impression(s) / ED Diagnoses Final diagnoses:  COVID-19  Atypical chest pain    Rx / DC Orders ED Discharge Orders     None      Clinical Impression: 1. COVID-19   2. Atypical chest pain      Disposition: Discharge  Condition: Good  I have discussed the results, Dx and Tx plan with the pt(& family if present). He/she/they expressed understanding and agree(s) with the plan. Discharge instructions discussed at great length. Strict return precautions discussed and pt &/or family have verbalized understanding of the instructions. No further questions at time of discharge.    New Prescriptions   No medications on file    Follow Up: Leeroy Cha, MD [address] [place] [address] [facility] [place] [id number] [phone]  Ronalda Walpole, Gwenyth Allegra, MD 02/13/22 [phone]

## 2022-02-13 NOTE — ED Triage Notes (Signed)
Pt had recently recovered fromCOvid, went to primary MD yesterday seen yesterday for chest pain ,pt was called by office and told her to come be seen to evaluate if have blood clot because d dimer was positive.

## 2022-02-13 NOTE — Discharge Instructions (Addendum)
Your history, exam, work-up today did not show any evidence of acute blood clot on the imaging.  The lung changes seen on the CT are likely reflective of your recent COVID-19 infection but they did not see convincing evidence of acute pneumonia at this time.  Given your reassuring monitoring for over 6 hours and reassuring vital signs we do feel you are safe for discharge home.  Please follow-up with your primary doctor and rest and stay hydrated.  If any symptoms change or worsen acutely, please return to the nearest emergency department.

## 2022-02-18 ENCOUNTER — Telehealth: Payer: Self-pay

## 2022-02-18 NOTE — Telephone Encounter (Signed)
     Patient  visit on 10/7  at [facility] you been able to follow up with your primary care physician?  yes The patient was or was not able to obtain any needed medicine or equipment. yes  Are there diet recommendations that you are having difficulty following?  na Patient expresses understanding of discharge instructions and education provided has no other needs at this time. yes     [facility], Care Management  [phone] [address], [place], [place] [address] Phone: [phone] Email: [email or web address]

## 2022-02-23 DIAGNOSIS — R2232 Localized swelling, mass and lump, left upper limb: Secondary | ICD-10-CM | POA: Diagnosis not present

## 2022-02-23 DIAGNOSIS — M13842 Other specified arthritis, left hand: Secondary | ICD-10-CM | POA: Diagnosis not present

## 2022-02-26 DIAGNOSIS — R2232 Localized swelling, mass and lump, left upper limb: Secondary | ICD-10-CM | POA: Diagnosis not present

## 2022-03-08 NOTE — Progress Notes (Unsigned)
Cardiology Office Note:   Date:  03/09/2022  NAME:  Tara Moreno    MRN: [id number] DOB:  06/04/1945   PCP:  Leeroy Cha, MD  Cardiologist:  None  Electrophysiologist:  None   Referring MD: Leeroy Cha,*   Chief Complaint  Patient presents with   Chest Pain   History of Present Illness:   Tara Moreno is a 76 y.o. female with a hx of HTN, HLD, obesity who is being seen today for the evaluation of chest pain at the request of Leeroy Cha, MD. Seen in ER 02/13/2022 for CP. Chest CT with groundglass consistent with prior covid. Moderate pericardial effusion. EKG with LBBB. She reports she suffered a fall in late August.  Apparently also suffered COVID in September.  Symptoms were upper respiratory nature.  She reports having cough and fatigue.  She reports she developed chest discomfort.  Described as tingling in her chest.  Also pain in her right arm.  Symptoms would occur with laying down.  It would last 60 seconds and resolved.  She reports it mainly occur in the mornings.  She is still having this but no tingling in her arm.  She describes something as being stuck in her chest.  She describes it is achy.  She did have COVID-19 pneumonia.  She went to the emergency room on 02/13/2022.  Chest CT was consistent with this.  There was a moderate pericardial effusion.  Her EKG demonstrated left bundle branch block.  Troponins were negative.  She was discharged for follow-up with [place].  She is continue to experience chest discomfort.  It occurs when laying down.  She feels as if something is stuck in her chest.  Symptoms last 60 seconds and resolved.  Do not improve with leaning forward.  Not exertional.  She reports she is not that active but is not having any exertional chest pain.  She reports her shortness of breath is improving.  No fevers.  Her blood pressure is 114/86.  She is not diabetic.  She is never had a heart attack or stroke.  She has never been told she  had a left bundle branch block.  She does not smoke.  She is a retired Nutritional therapist.  CV exam normal today.  EKG with left bundle branch block.  Problem List HTN HLD Obesity  LBBB  Past Medical History: Past Medical History:  Diagnosis Date   Arthritis    "thumb joints, was in my knees, in my back" (07/01/2017)   Frequent urination at night    GERD (gastroesophageal reflux disease)    Hyperlipemia    Hypertension    PONV (postoperative nausea and vomiting)     Past Surgical History: Past Surgical History:  Procedure Laterality Date   ABDOMINAL HYSTERECTOMY     "partial"   ADRENAL GLAND SURGERY Left 1990s   "tumor on my adrenal gland removed"   AUGMENTATION MAMMAPLASTY  2000   BUNIONECTOMY Left    CARDIAC CATHETERIZATION     no PCI; had before renal tumor was removed   COLONOSCOPY     EXCISION MASS UPPER EXTREMETIES Right 07/01/2017   Procedure: RIGHT SCAPULAR MASS EXCISION;  Surgeon: Netta Cedars, MD;  Location: [facility];  Service: Orthopedics;  Laterality: Right;   JOINT REPLACEMENT     REVERSE SHOULDER ARTHROPLASTY Right 03/08/2014   Procedure: RIGHT TOTAL SHOULDER ARTHROPLASTY VERSES A REVERSE ARTHROPLASTY;  Surgeon: Augustin Schooling, MD;  Location: [facility];  Service: Orthopedics;  Laterality: Right;  REVERSE SHOULDER ARTHROPLASTY Left 07/05/2014   Procedure: LEFT REVERSE SHOULDER ARTHROPLASTY;  Surgeon: Augustin Schooling, MD;  Location: [facility];  Service: Orthopedics;  Laterality: Left;   REVISION TOTAL SHOULDER ARTHROPLASTY Right 03/08/2014   dr Stanford Breed MASS EXCISION Right 07/01/2017   TONSILLECTOMY     TOTAL KNEE ARTHROPLASTY Bilateral 10/09/2014   Procedure: BILATERAL TOTAL KNEE ARTHROPLASTY;  Surgeon: Gaynelle Arabian, MD;  Location: [facility];  Service: Orthopedics;  Laterality: Bilateral;  with general anesthesia   TUBAL LIGATION      Current Medications: Current Meds  Medication Sig   amLODipine-olmesartan (AZOR) 10-40 MG tablet Take 1 tablet by mouth  daily.   atorvastatin (LIPITOR) 10 MG tablet Take 10 mg by mouth daily.    Green Tea, Camillia sinensis, (GREEN TEA EXTRACT PO) Take 500 mg by mouth daily.   Lecithin 1200 MG CAPS Take 1,200 mg by mouth daily.   metoprolol tartrate (LOPRESSOR) 100 MG tablet Take 1 tablet by mouth once for procedure.   MILK THISTLE EXTRACT PO Take 1 capsule by mouth daily.   Multiple Vitamin (MULTIVITAMIN WITH MINERALS) TABS tablet Take 1 tablet by mouth daily. ULTRA MEGA MULTIVITAMIN TIME RELEASE   Omega-3 Fatty Acids (SALMON OIL-1000 PO) Take 2,000 mg by mouth daily.   pantoprazole (PROTONIX) 40 MG tablet Take 40 mg by mouth daily.    Probiotic Product (PROBIOTIC PO) Take 1 capsule by mouth daily. ULTRA PROBIOTIC     Allergies:    Patient has no known allergies.   Social History: Social History   Socioeconomic History   Marital status: Married    Spouse name: Not on file   Number of children: 2   Years of education: Not on file   Highest education level: Not on file  Occupational History   Occupation: Retired Nutritional therapist  Tobacco Use   Smoking status: Never   Smokeless tobacco: Never  Vaping Use   Vaping Use: Never used  Substance and Sexual Activity   Alcohol use: No   Drug use: No   Sexual activity: Yes  Other Topics Concern   Not on file  Social History Narrative   Not on file   Social Determinants of Health   Financial Resource Strain: Not on file  Food Insecurity: Not on file  Transportation Needs: Not on file  Physical Activity: Not on file  Stress: Not on file  Social Connections: Not on file     Family History: The patient's family history includes Hypertension in her brother, father, mother, sister, and another family member.  ROS:   All other ROS reviewed and negative. Pertinent positives noted in the HPI.     EKGs/Labs/Other Studies Reviewed:   The following studies were personally reviewed by me today:  EKG:  EKG is ordered today.  The ekg ordered  today demonstrates normal sinus rhythm heart rate left bundle branch block, and was personally reviewed by me.   Recent Labs: 02/13/2022: ALT 15; BUN 18; Creatinine, Ser 1.06; Hemoglobin 14.0; Platelets 261; Potassium 4.6; Sodium 143   Recent Lipid Panel No results found for: "CHOL", "TRIG", "HDL", "CHOLHDL", "VLDL", "LDLCALC", "LDLDIRECT"  Physical Exam:   VS:  BP 114/86   Pulse 78   Ht _0  (1.702 m)   Wt 261 lb 3.2 oz (118.5 kg)   SpO2 97%   BMI 40.91 kg/m    Wt Readings from Last 3 Encounters:  03/09/22 261 lb 3.2 oz (118.5 kg)  12/29/21 255 lb (115.7 kg)  07/24/21 245 lb 9.6 oz (111.4 kg)    General: Well nourished, well developed, in no acute distress Head: Atraumatic, normal size  Eyes: PEERLA, EOMI  Neck: Supple, no JVD Endocrine: No thryomegaly Cardiac: Normal S1, S2; RRR; no murmurs, rubs, or gallops Lungs: Clear to auscultation bilaterally, no wheezing, rhonchi or rales  Abd: Soft, nontender, no hepatomegaly  Ext: No edema, pulses 2+ Musculoskeletal: No deformities, BUE and BLE strength normal and equal Skin: Warm and dry, no rashes   Neuro: Alert and oriented to person, place, time, and situation, CNII-XII grossly intact, no focal deficits  Psych: Normal mood and affect   ASSESSMENT:   JALIZA SEIFRIED is a 76 y.o. female who presents for the following: 1. Chest pain of uncertain etiology   2. SOB (shortness of breath)   3. Pericardial effusion   4. LBBB (left bundle branch block)     PLAN:   1. Chest pain of uncertain etiology 2. SOB (shortness of breath) 3. Pericardial effusion 4. LBBB (left bundle branch block) -Symptoms of chest discomfort after COVID-19 infection.  Chest CT from the emergency room demonstrated a moderate pericardial effusion.  She also has coronary calcifications.  EKG shows left bundle branch block which is new.  Troponins were negative.  No evidence of myocarditis.  She could have symptoms related to pericarditis given the  pericardial effusion.  Her symptoms are not that typical for this.  She reports infrequent episodes of 60 seconds of chest discomfort and feels as if something is stuck in her chest.  We will check an ESR and CRP.  I would like to check an echocardiogram to determine if she still has a pericardial effusion.  She has no signs of congestive heart failure.  She did have evidence of coronary calcifications.  We will proceed with coronary CTA given left bundle branch block and symptoms.  She will give [place] a BMP today.  We we will proceed with 100 mg of metoprolol tartrate 2 hours before the scan.  She will see me back in 3 months.  She will see me sooner based on the results of her scans.  Disposition: Return in about 3 months (around 06/09/2022).  Medication Adjustments/Labs and Tests Ordered: Current medicines are reviewed at length with the patient today.  Concerns regarding medicines are outlined above.  Orders Placed This Encounter  Procedures   CT CORONARY MORPH W/CTA COR W/SCORE W/CA W/CM &/OR WO/CM   Sedimentation rate   C-reactive protein   Basic metabolic panel   EKG 00-TMAU   ECHOCARDIOGRAM COMPLETE   Meds ordered this encounter  Medications   metoprolol tartrate (LOPRESSOR) 100 MG tablet    Sig: Take 1 tablet by mouth once for procedure.    Dispense:  1 tablet    Refill:  0    Patient Instructions  Medication Instructions:  Take Metoprolol 100 mg two hours before CT when scheduled.   *If you need a refill on your cardiac medications before your next appointment, please call your pharmacy*   Lab Work: ESR, CRP, BMET today   If you have labs (blood work) drawn today and your tests are completely normal, you will receive your results only by: [place] (if you have MyChart) OR A paper copy in the mail If you have any lab test that is abnormal or we need to change your treatment, we will call you to review the results.   Testing/Procedures: Echocardiogram - Your  physician has requested that you have an  echocardiogram. Echocardiography is a painless test that uses sound waves to create images of your heart. It provides your doctor with information about the size and shape of your heart and how well your heart's chambers and valves are working. This procedure takes approximately one hour. There are no restrictions for this procedure.   Your physician has requested that you have cardiac CT. Cardiac computed tomography (CT) is a painless test that uses an x-ray machine to take clear, detailed pictures of your heart. For further information please visit [email or web address].[email or web address]. Please follow instruction sheet as given.     Follow-Up: At [facility], you and your health needs are our priority.  As part of our continuing mission to provide you with exceptional heart care, we have created designated Provider Care Teams.  These Care Teams include your primary Cardiologist (physician) and Advanced Practice Providers (APPs -  Physician Assistants and Nurse Practitioners) who all work together to provide you with the care you need, when you need it.  We recommend signing up for the patient portal called "MyChart".  Sign up information is provided on this After Visit Summary.  MyChart is used to connect with patients for Virtual Visits (Telemedicine).  Patients are able to view lab/test results, encounter notes, upcoming appointments, etc.  Non-urgent messages can be sent to your provider as well.   To learn more about what you can do with MyChart, go to [email or web address].    Your next appointment:   3 month(s)  The format for your next appointment:   In Person  Provider:   Eleonore Chiquito, MD    Your cardiac CT will be scheduled at one of the below locations:   [facility] [address] [place], [place] [address] [phone]   If scheduled at [facility], please arrive at the [facility] and Children's Entrance (Entrance C2)  of [facility] 30 minutes prior to test start time. You can use the FREE valet parking offered at entrance C (encouraged to control the heart rate for the test)  Proceed to the [facility] Radiology Department (first floor) to check-in and test prep.  All radiology patients and guests should use entrance C2 at [facility], accessed from [facility], even though the hospital's physical address listed is [address].      Please follow these instructions carefully (unless otherwise directed):   On the Night Before the Test: Be sure to Drink plenty of water. Do not consume any caffeinated/decaffeinated beverages or chocolate 12 hours prior to your test. Do not take any antihistamines 12 hours prior to your test.  On the Day of the Test: Drink plenty of water until 1 hour prior to the test. Do not eat any food 1 hour prior to test. You may take your regular medications prior to the test.  Take metoprolol (Lopressor) two hours prior to test. HOLD Furosemide/Hydrochlorothiazide morning of the test. FEMALES- please wear underwire-free bra if available, avoid dresses & tight clothing      After the Test: Drink plenty of water. After receiving IV contrast, you may experience a mild flushed feeling. This is normal. On occasion, you may experience a mild rash up to 24 hours after the test. This is not dangerous. If this occurs, you can take Benadryl 25 mg and increase your fluid intake. If you experience trouble breathing, this can be serious. If it is severe call 911 IMMEDIATELY. If it is mild, please call our office. If  you take any of these medications: Glipizide/Metformin, Avandament, Glucavance, please do not take 48 hours after completing test unless otherwise instructed.  We will call to schedule your test 2-4 weeks out understanding that some insurance companies will need an authorization prior to the service being performed.   For non-scheduling  related questions, please contact the cardiac imaging nurse navigator should you have any questions/concerns: Marchia Bond, Cardiac Imaging Nurse Navigator Gordy Clement, Cardiac Imaging Nurse Navigator [facility] Direct Office Dial: [phone]   For scheduling needs, including cancellations and rescheduling, please call [place], [phone].          Signed, Addison Naegeli. Audie Box, MD, [facility]  [address], [place],  [address] [phone]  03/09/2022 4:34 PM

## 2022-03-09 ENCOUNTER — Ambulatory Visit: Payer: Medicare Other | Attending: Cardiovascular Disease | Admitting: Cardiovascular Disease

## 2022-03-09 ENCOUNTER — Encounter: Payer: Self-pay | Admitting: Cardiovascular Disease

## 2022-03-09 VITALS — BP 114/86 | HR 78 | Ht 67.0 in | Wt 261.2 lb

## 2022-03-09 DIAGNOSIS — R079 Chest pain, unspecified: Secondary | ICD-10-CM | POA: Diagnosis not present

## 2022-03-09 DIAGNOSIS — I447 Left bundle-branch block, unspecified: Secondary | ICD-10-CM

## 2022-03-09 DIAGNOSIS — R0602 Shortness of breath: Secondary | ICD-10-CM

## 2022-03-09 DIAGNOSIS — I3139 Other pericardial effusion (noninflammatory): Secondary | ICD-10-CM

## 2022-03-09 MED ORDER — METOPROLOL TARTRATE 100 MG PO TABS: ORAL_TABLET | ORAL | 0 refills | Status: DC

## 2022-03-09 NOTE — Patient Instructions (Signed)
Medication Instructions:  Take Metoprolol 100 mg two hours before CT when scheduled.   *If you need a refill on your cardiac medications before your next appointment, please call your pharmacy*   Lab Work: ESR, CRP, BMET today   If you have labs (blood work) drawn today and your tests are completely normal, you will receive your results only by: [place] (if you have MyChart) OR A paper copy in the mail If you have any lab test that is abnormal or we need to change your treatment, we will call you to review the results.   Testing/Procedures: Echocardiogram - Your physician has requested that you have an echocardiogram. Echocardiography is a painless test that uses sound waves to create images of your heart. It provides your doctor with information about the size and shape of your heart and how well your heart's chambers and valves are working. This procedure takes approximately one hour. There are no restrictions for this procedure.   Your physician has requested that you have cardiac CT. Cardiac computed tomography (CT) is a painless test that uses an x-ray machine to take clear, detailed pictures of your heart. For further information please visit [email or web address]. Please follow instruction sheet as given.     Follow-Up: At [facility], you and your health needs are our priority.  As part of our continuing mission to provide you with exceptional heart care, we have created designated Provider Care Teams.  These Care Teams include your primary Cardiologist (physician) and Advanced Practice Providers (APPs -  Physician Assistants and Nurse Practitioners) who all work together to provide you with the care you need, when you need it.  We recommend signing up for the patient portal called "MyChart".  Sign up information is provided on this After Visit Summary.  MyChart is used to connect with patients for Virtual Visits (Telemedicine).  Patients are able to view lab/test  results, encounter notes, upcoming appointments, etc.  Non-urgent messages can be sent to your provider as well.   To learn more about what you can do with MyChart, go to [email or web address].    Your next appointment:   3 month(s)  The format for your next appointment:   In Person  Provider:   Eleonore Chiquito, MD    Your cardiac CT will be scheduled at one of the below locations:   [facility] [address] [place], [place] [address] [phone]   If scheduled at [facility], please arrive at the [facility] and Children's Entrance (Entrance C2) of [facility] 30 minutes prior to test start time. You can use the FREE valet parking offered at entrance C (encouraged to control the heart rate for the test)  Proceed to the [facility] Radiology Department (first floor) to check-in and test prep.  All radiology patients and guests should use entrance C2 at [facility], accessed from [facility], even though the hospital's physical address listed is [address]      Please follow these instructions carefully (unless otherwise directed):   On the Night Before the Test: Be sure to Drink plenty of water. Do not consume any caffeinated/decaffeinated beverages or chocolate 12 hours prior to your test. Do not take any antihistamines 12 hours prior to your test.  On the Day of the Test: Drink plenty of water until 1 hour prior to the test. Do not eat any food 1 hour prior to test. You may take your regular medications prior to  the test.  Take metoprolol (Lopressor) two hours prior to test. HOLD Furosemide/Hydrochlorothiazide morning of the test. FEMALES- please wear underwire-free bra if available, avoid dresses & tight clothing      After the Test: Drink plenty of water. After receiving IV contrast, you may experience a mild flushed feeling. This is normal. On occasion, you may experience a mild rash up to 24 hours  after the test. This is not dangerous. If this occurs, you can take Benadryl 25 mg and increase your fluid intake. If you experience trouble breathing, this can be serious. If it is severe call 911 IMMEDIATELY. If it is mild, please call our office. If you take any of these medications: Glipizide/Metformin, Avandament, Glucavance, please do not take 48 hours after completing test unless otherwise instructed.  We will call to schedule your test 2-4 weeks out understanding that some insurance companies will need an authorization prior to the service being performed.   For non-scheduling related questions, please contact the cardiac imaging nurse navigator should you have any questions/concerns: Marchia Bond, Cardiac Imaging Nurse Navigator Gordy Clement, Cardiac Imaging Nurse Navigator [facility] Direct Office Dial: [phone]   For scheduling needs, including cancellations and rescheduling, please call [place], [phone].

## 2022-03-10 LAB — C-REACTIVE PROTEIN: CRP: 6 mg/L (ref 0–10)

## 2022-03-10 LAB — BASIC METABOLIC PANEL
BUN/Creatinine Ratio: 16 (ref 12–28)
BUN: 16 mg/dL (ref 8–27)
CO2: 25 mmol/L (ref 20–29)
Calcium: 10 mg/dL (ref 8.7–10.3)
Chloride: 100 mmol/L (ref 96–106)
Creatinine, Ser: 0.97 mg/dL (ref 0.57–1.00)
Glucose: 84 mg/dL (ref 70–99)
Potassium: 4.7 mmol/L (ref 3.5–5.2)
Sodium: 138 mmol/L (ref 134–144)
eGFR: 61 mL/min/{1.73_m2} (ref >59)

## 2022-03-10 LAB — SEDIMENTATION RATE: Sed Rate: 94 mm/hr — ABNORMAL HIGH (ref 0–40)

## 2022-03-19 DIAGNOSIS — L729 Follicular cyst of the skin and subcutaneous tissue, unspecified: Secondary | ICD-10-CM | POA: Diagnosis not present

## 2022-03-19 DIAGNOSIS — Z8616 Personal history of COVID-19: Secondary | ICD-10-CM | POA: Diagnosis not present

## 2022-03-19 DIAGNOSIS — I10 Essential (primary) hypertension: Secondary | ICD-10-CM | POA: Diagnosis not present

## 2022-03-19 DIAGNOSIS — E785 Hyperlipidemia, unspecified: Secondary | ICD-10-CM | POA: Diagnosis not present

## 2022-03-19 DIAGNOSIS — R079 Chest pain, unspecified: Secondary | ICD-10-CM | POA: Diagnosis not present

## 2022-03-23 ENCOUNTER — Telehealth (HOSPITAL_COMMUNITY): Payer: Self-pay | Admitting: *Deleted

## 2022-03-23 NOTE — Telephone Encounter (Signed)
Attempted to call patient regarding upcoming cardiac CT appointment. °Left message on voicemail with name and callback number ° °Matisyn Cabeza RN Navigator Cardiac Imaging °[place] °[phone] Office °[phone] Cell ° °

## 2022-03-23 NOTE — Telephone Encounter (Signed)
Patient returning call regarding upcoming cardiac imaging study; pt verbalizes understanding of appt date/time, parking situation and where to check in, medications ordered, and verified current allergies; name and call back number provided for further questions should they arise  Gordy Clement RN Navigator Cardiac Imaging [facility] [phone] office [phone] cell  Patient to hold her daily BP medications but will take '100mg'$  metoprolol tartrate two hours prior to her cardiac CT scan.  She is aware to arrive at 3:30pm.

## 2022-03-24 ENCOUNTER — Ambulatory Visit (HOSPITAL_COMMUNITY)
Admission: RE | Admit: 2022-03-24 | Discharge: 2022-03-24 | Disposition: A | Discharge status: Home / Self Care | Payer: Medicare Other | Source: Ambulatory Visit | Attending: Cardiovascular Disease | Admitting: Cardiovascular Disease

## 2022-03-24 DIAGNOSIS — I3139 Other pericardial effusion (noninflammatory): Secondary | ICD-10-CM | POA: Diagnosis not present

## 2022-03-24 DIAGNOSIS — R079 Chest pain, unspecified: Secondary | ICD-10-CM | POA: Insufficient documentation

## 2022-03-24 DIAGNOSIS — I2510 Atherosclerotic heart disease of native coronary artery without angina pectoris: Secondary | ICD-10-CM | POA: Diagnosis not present

## 2022-03-24 DIAGNOSIS — I288 Other diseases of pulmonary vessels: Secondary | ICD-10-CM | POA: Diagnosis not present

## 2022-03-24 MED ORDER — NITROGLYCERIN 0.4 MG SL SUBL
0.8000 mg | SUBLINGUAL_TABLET | Freq: Once | SUBLINGUAL | Status: AC
  Administered 2022-03-24: 0.8 mg via SUBLINGUAL

## 2022-03-24 MED ORDER — NITROGLYCERIN 0.4 MG SL SUBL
SUBLINGUAL_TABLET | SUBLINGUAL | Status: AC
  Filled 2022-03-24: qty 2

## 2022-03-24 MED ORDER — IOHEXOL 350 MG/ML SOLN
95.0000 mL | Freq: Once | INTRAVENOUS | Status: AC | PRN
  Administered 2022-03-24: 95 mL via INTRAVENOUS

## 2022-03-29 ENCOUNTER — Ambulatory Visit (HOSPITAL_COMMUNITY): Payer: Medicare Other

## 2022-04-20 ENCOUNTER — Ambulatory Visit (HOSPITAL_COMMUNITY): Payer: Medicare Other | Attending: Cardiovascular Disease

## 2022-04-20 DIAGNOSIS — I3139 Other pericardial effusion (noninflammatory): Secondary | ICD-10-CM | POA: Insufficient documentation

## 2022-04-20 DIAGNOSIS — E785 Hyperlipidemia, unspecified: Secondary | ICD-10-CM | POA: Insufficient documentation

## 2022-04-20 DIAGNOSIS — I10 Essential (primary) hypertension: Secondary | ICD-10-CM | POA: Diagnosis not present

## 2022-04-20 DIAGNOSIS — R079 Chest pain, unspecified: Secondary | ICD-10-CM

## 2022-04-20 LAB — ECHOCARDIOGRAM COMPLETE
Area-P 1/2: 3.77 cm2
Calc EF: 46.8 %
S' Lateral: 3 cm
Single Plane A2C EF: 50.7 %
Single Plane A4C EF: 42.3 %

## 2022-04-21 ENCOUNTER — Encounter: Payer: Self-pay | Admitting: Internal Medicine

## 2022-04-21 ENCOUNTER — Ambulatory Visit
Admission: RE | Admit: 2022-04-21 | Discharge: 2022-04-21 | Disposition: A | Discharge status: Home / Self Care | Payer: Medicare Other | Source: Ambulatory Visit | Attending: Internal Medicine | Admitting: Internal Medicine

## 2022-04-21 ENCOUNTER — Ambulatory Visit: Payer: Medicare Other | Admitting: Internal Medicine

## 2022-04-21 DIAGNOSIS — Z86010 Personal history of colonic polyps: Secondary | ICD-10-CM

## 2022-04-21 DIAGNOSIS — M8589 Other specified disorders of bone density and structure, multiple sites: Secondary | ICD-10-CM | POA: Diagnosis not present

## 2022-04-21 DIAGNOSIS — Z780 Asymptomatic menopausal state: Secondary | ICD-10-CM | POA: Diagnosis not present

## 2022-04-21 DIAGNOSIS — K921 Melena: Secondary | ICD-10-CM

## 2022-04-21 DIAGNOSIS — M8588 Other specified disorders of bone density and structure, other site: Secondary | ICD-10-CM

## 2022-04-21 DIAGNOSIS — I10 Essential (primary) hypertension: Secondary | ICD-10-CM | POA: Diagnosis not present

## 2022-04-21 DIAGNOSIS — Z8616 Personal history of COVID-19: Secondary | ICD-10-CM | POA: Diagnosis not present

## 2022-04-21 DIAGNOSIS — E785 Hyperlipidemia, unspecified: Secondary | ICD-10-CM | POA: Diagnosis not present

## 2022-04-21 DIAGNOSIS — R9389 Abnormal findings on diagnostic imaging of other specified body structures: Secondary | ICD-10-CM | POA: Diagnosis not present

## 2022-04-21 MED ORDER — NA SULFATE-K SULFATE-MG SULF 17.5-3.13-1.6 GM/177ML PO SOLN: 1.0000 | ORAL | 0 refills | Status: DC

## 2022-04-21 NOTE — Progress Notes (Signed)
Chief Complaint: Black stools  HPI : 76 year old female with history of arthritis, GERD, colon polyps, and HTN presents with black stools  Her last colonoscopy was 03/2015 when she was found to have colon polyps, and she was due in 2021 for another colonoscopy. In 01/2022 she started developing changes in her stools. She noticed some black or chalky-appearing stools that can also sometimes be sticky. She is not taking any iron supplements or Pepto Bismol. She has at least 1 BM per day. Endorses ab pain in lower middle abdomen. Endorses nausea. Denies vomiting. Endorses acid reflux. Pantoprazole helps keep her GERD under good control. Denies family history of colon cancer. Weight has been up-and-down. She took ibuprofen and Aleve when she had surgery on her finger in 02/2022, and she stopped that subsequently. Denies use of blood thinners.  Wt Readings from Last 3 Encounters:  04/21/22 257 lb (116.6 kg)  03/09/22 261 lb 3.2 oz (118.5 kg)  12/29/21 255 lb (115.7 kg)   Past Medical History:  Diagnosis Date   Arthritis    "thumb joints, was in my knees, in my back" (07/01/2017)   Frequent urination at night    GERD (gastroesophageal reflux disease)    Hyperlipemia    Hypertension    PONV (postoperative nausea and vomiting)    Past Surgical History:  Procedure Laterality Date   ABDOMINAL HYSTERECTOMY     "partial"   ADRENAL GLAND SURGERY Left 1990s   "tumor on my adrenal gland removed"   AUGMENTATION MAMMAPLASTY  2000   BUNIONECTOMY Left    CARDIAC CATHETERIZATION     no PCI; had before renal tumor was removed   COLONOSCOPY     EXCISION MASS UPPER EXTREMETIES Right 07/01/2017   Procedure: RIGHT SCAPULAR MASS EXCISION;  Surgeon: Netta Cedars, MD;  Location: [facility];  Service: Orthopedics;  Laterality: Right;   FINGER DEBRIDEMENT  02/2022   JOINT REPLACEMENT     REVERSE SHOULDER ARTHROPLASTY Right 03/08/2014   Procedure: RIGHT TOTAL SHOULDER ARTHROPLASTY VERSES A REVERSE ARTHROPLASTY;   Surgeon: Augustin Schooling, MD;  Location: [facility];  Service: Orthopedics;  Laterality: Right;   REVERSE SHOULDER ARTHROPLASTY Left 07/05/2014   Procedure: LEFT REVERSE SHOULDER ARTHROPLASTY;  Surgeon: Augustin Schooling, MD;  Location: [facility];  Service: Orthopedics;  Laterality: Left;   REVISION TOTAL SHOULDER ARTHROPLASTY Right 03/08/2014   dr Stanford Breed MASS EXCISION Right 07/01/2017   TONSILLECTOMY     TOTAL KNEE ARTHROPLASTY Bilateral 10/09/2014   Procedure: BILATERAL TOTAL KNEE ARTHROPLASTY;  Surgeon: Gaynelle Arabian, MD;  Location: [facility];  Service: Orthopedics;  Laterality: Bilateral;  with general anesthesia   TUBAL LIGATION     Family History  Problem Relation Age of Onset   Hypertension Mother    Hypertension Father    Hypertension Sister    Hypertension Brother    Hypertension Other    Colon cancer Neg Hx    Liver disease Neg Hx    Esophageal cancer Neg Hx    Social History   Tobacco Use   Smoking status: Never   Smokeless tobacco: Never  Vaping Use   Vaping Use: Never used  Substance Use Topics   Alcohol use: No   Drug use: No   Current Outpatient Medications  Medication Sig Dispense Refill   amLODipine-olmesartan (AZOR) 10-40 MG tablet Take 1 tablet by mouth daily.     atorvastatin (LIPITOR) 10 MG tablet Take 10 mg by mouth daily.  Green Tea, Camillia sinensis, (GREEN TEA EXTRACT PO) Take 500 mg by mouth daily.     Lecithin 1200 MG CAPS Take 1,200 mg by mouth daily.     MILK THISTLE EXTRACT PO Take 1 capsule by mouth daily.     Multiple Vitamin (MULTIVITAMIN WITH MINERALS) TABS tablet Take 1 tablet by mouth daily. ULTRA MEGA MULTIVITAMIN TIME RELEASE     Omega-3 Fatty Acids (SALMON OIL-1000 PO) Take 2,000 mg by mouth daily.     pantoprazole (PROTONIX) 40 MG tablet Take 40 mg by mouth daily.      Probiotic Product (PROBIOTIC PO) Take 1 capsule by mouth daily. ULTRA PROBIOTIC     No current facility-administered medications for this visit.   No Known  Allergies  Review of Systems: All systems reviewed and negative except where noted in HPI.   Physical Exam: BP 120/78   Ht _0  (1.702 m)   Wt 257 lb (116.6 kg)   BMI 40.25 kg/m  Constitutional: Pleasant,well-developed, female in no acute distress. HEENT: Normocephalic and atraumatic. Conjunctivae are normal. No scleral icterus. Cardiovascular: Normal rate, regular rhythm.  Pulmonary/chest: Effort normal and breath sounds normal. No wheezing, rales or rhonchi. Abdominal: Soft, nondistended, non-tender Bowel sounds active throughout. There are no masses palpable. No hepatomegaly. Extremities: No edema Neurological: Alert and oriented to person place and time. Skin: Skin is warm and dry. No rashes noted. Psychiatric: Normal mood and affect. Behavior is normal.  Labs 02/2022: BMP and CRP unremarkable. ESR with elevated 94. CMP with elevated Cr of 1.06. CBC unremarkable.   CT A/P w/contrast 01/01/15: IMPRESSION: 1. No acute abdominal/pelvic findings, mass lesions or lymphadenopathy. No CT findings to suggest acute pancreatitis. 2. Cholelithiasis but no CT findings for acute cholecystitis. 3. Surgical changes from a previous left adrenalectomy. 4. Diverticulosis of the sigmoid colon without findings for acute diverticulitis. 5. 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 MRI thoracic spine without and with contrast suggested for further evaluation to exclude a malignant process.  Colonoscopy Dr. Michail Sermon 03/2015 for routine risk screening. Findings: melanosis, 3 subCM polyps (one as an adenoma on path), hemorrhoids, diverticulosis.  He recommended repeat colonoscopy at 5-year interval  ASSESSMENT AND PLAN: Melena History of colon polyps GERD Patient presents with melena that started 3 months ago. She is overdue for polyp surveillance for her colonoscopy and would like be to be scheduled for another colonoscopy  soon. If the colonoscopy is unrevealing for a source of blood loss, then an EGD could be considered afterwards. Her GERD is well controlled on daily PPI. - Colonoscopy [facility] - Patient declined the EGD for now and would prefer to start off with a colonoscopy  Christia Reading, MD

## 2022-04-21 NOTE — Patient Instructions (Signed)
_______________________________________________________  If you are age 74 or older, your body mass index should be between 23-30. Your Body mass index is 40.25 kg/m. If this is out of the aforementioned range listed, please consider follow up with your Primary Care Provider. ________________________________________________________  The [facility] providers would like to encourage you to use [facility] to communicate with providers for non-urgent requests or questions.  Due to long hold times on the telephone, sending your provider a message by [employer] may be a faster and more efficient way to get a response.  Please allow 48 business hours for a response.  Please remember that this is for non-urgent requests.  _______________________________________________________  Dennis Bast have been scheduled for a colonoscopy. Please follow written instructions given to you at your visit today.  Please pick up your prep supplies at the pharmacy within the next 1-3 days. If you use inhalers (even only as needed), please bring them with you on the day of your procedure.  Due to recent changes in healthcare laws, you may see the results of your imaging and laboratory studies on MyChart before your provider has had a chance to review them.  We understand that in some cases there may be results that are confusing or concerning to you. Not all laboratory results come back in the same time frame and the provider may be waiting for multiple results in order to interpret others.  Please give [place] 48 hours in order for your provider to thoroughly review all the results before contacting the office for clarification of your results.   Thank you for entrusting me with your care and choosing [facility].  Dr Lorenso Courier

## 2022-04-22 ENCOUNTER — Ambulatory Visit: Payer: Medicare Other | Admitting: Nurse Practitioner

## 2022-04-23 DIAGNOSIS — R2232 Localized swelling, mass and lump, left upper limb: Secondary | ICD-10-CM | POA: Diagnosis not present

## 2022-05-05 ENCOUNTER — Encounter: Payer: Self-pay | Admitting: Internal Medicine

## 2022-05-06 ENCOUNTER — Ambulatory Visit: Payer: Medicare Other | Admitting: Emergency Medicine

## 2022-05-13 ENCOUNTER — Ambulatory Visit (AMBULATORY_SURGERY_CENTER): Payer: Medicare Other | Admitting: Internal Medicine

## 2022-05-13 ENCOUNTER — Encounter: Payer: Self-pay | Admitting: Internal Medicine

## 2022-05-13 VITALS — BP 122/71 | HR 81 | Temp 96.8°F | Resp 12 | Ht 67.0 in | Wt 257.0 lb

## 2022-05-13 DIAGNOSIS — D123 Benign neoplasm of transverse colon: Secondary | ICD-10-CM

## 2022-05-13 DIAGNOSIS — K649 Unspecified hemorrhoids: Secondary | ICD-10-CM | POA: Diagnosis not present

## 2022-05-13 DIAGNOSIS — D122 Benign neoplasm of ascending colon: Secondary | ICD-10-CM | POA: Diagnosis not present

## 2022-05-13 DIAGNOSIS — Z86010 Personal history of colonic polyps: Secondary | ICD-10-CM

## 2022-05-13 DIAGNOSIS — I10 Essential (primary) hypertension: Secondary | ICD-10-CM | POA: Diagnosis not present

## 2022-05-13 DIAGNOSIS — D120 Benign neoplasm of cecum: Secondary | ICD-10-CM

## 2022-05-13 DIAGNOSIS — K921 Melena: Secondary | ICD-10-CM

## 2022-05-13 DIAGNOSIS — K5730 Diverticulosis of large intestine without perforation or abscess without bleeding: Secondary | ICD-10-CM | POA: Diagnosis not present

## 2022-05-13 MED ORDER — SODIUM CHLORIDE 0.9 % IV SOLN: 500.0000 mL | Freq: Once | INTRAVENOUS | Status: DC

## 2022-05-13 NOTE — Progress Notes (Signed)
Pt's states no medical or surgical changes since previsit or office visit. VS assessed by D.T 

## 2022-05-13 NOTE — Op Note (Signed)
[facility] Patient Name: Tara Moreno Procedure Date: 05/13/2022 8:51 AM MRN: [id number] Endoscopist: Adline Mango Haswell , , [id number] Age: 77 Referring MD:  Date of Birth: [birth year] Gender: Female Account #: [id number] Procedure:                Colonoscopy Indications:              Melena Medicines:                Monitored Anesthesia Care Procedure:                Pre-Anesthesia Assessment:                           - Prior to the procedure, a History and Physical                            was performed, and patient medications and                            allergies were reviewed. The patient's tolerance of                            previous anesthesia was also reviewed. The risks                            and benefits of the procedure and the sedation                            options and risks were discussed with the patient.                            All questions were answered, and informed consent                            was obtained. Prior Anticoagulants: The patient has                            taken no anticoagulant or antiplatelet agents. ASA                            Grade Assessment: II - A patient with mild systemic                            disease. After reviewing the risks and benefits,                            the patient was deemed in satisfactory condition to                            undergo the procedure.                           After obtaining informed consent, the colonoscope  was passed under direct vision. Throughout the                            procedure, the patient's blood pressure, pulse, and                            oxygen saturations were monitored continuously. The                            CF [id number] #[id number] was introduced through the anus                            and advanced to the the terminal ileum. The                            colonoscopy was performed without difficulty. The                             patient tolerated the procedure well. The quality                            of the bowel preparation was good. The terminal                            ileum, ileocecal valve, appendiceal orifice, and                            rectum were photographed. Scope In: 8:55:46 AM Scope Out: 9:18:10 AM Scope Withdrawal Time: 0 hours 18 minutes 22 seconds  Total Procedure Duration: 0 hours 22 minutes 24 seconds  Findings:                 The terminal ileum appeared normal.                           Five sessile polyps were found in the transverse                            colon, ascending colon and cecum. The polyps were 3                            to 6 mm in size. These polyps were removed with a                            cold snare. Resection and retrieval were complete.                           Multiple large-mouthed diverticula were found in                            the sigmoid colon and descending colon.                           Non-bleeding internal hemorrhoids were found during  retroflexion. Complications:            No immediate complications. Estimated Blood Loss:     Estimated blood loss was minimal. Impression:               - The examined portion of the ileum was normal.                           - Five 3 to 6 mm polyps in the transverse colon, in                            the ascending colon and in the cecum, removed with                            a cold snare. Resected and retrieved.                           - Diverticulosis in the sigmoid colon and in the                            descending colon.                           - Non-bleeding internal hemorrhoids. Recommendation:           - Discharge patient to home (with escort).                           - No source of melena was found.                           - I would recommend an upper endoscopy procedure                            for further evaluation of a  source of melena.                           - Await pathology results.                           - The findings and recommendations were discussed                            with the patient. Dr Georgian Co "Lyndee Leo" Lorenso Courier,  05/13/2022 9:21:39 AM

## 2022-05-13 NOTE — Progress Notes (Signed)
Called to room to assist during endoscopic procedure.  Patient ID and intended procedure confirmed with present staff. Received instructions for my participation in the procedure from the performing physician.  

## 2022-05-13 NOTE — Patient Instructions (Addendum)
-   Discharge patient to home (with escort). - No source of melena was found. - I would recommend an upper endoscopy procedure for further evaluation of a source of melena. - Await pathology results. - The findings and recommendations were discussed with the patient.  YOU HAD AN ENDOSCOPIC PROCEDURE TODAY AT [facility]:   Refer to the procedure report that was given to you for any specific questions about what was found during the examination.  If the procedure report does not answer your questions, please call your gastroenterologist to clarify.  If you requested that your care partner not be given the details of your procedure findings, then the procedure report has been included in a sealed envelope for you to review at your convenience later.  YOU SHOULD EXPECT: Some feelings of bloating in the abdomen. Passage of more gas than usual.  Walking can help get rid of the air that was put into your GI tract during the procedure and reduce the bloating. If you had a lower endoscopy (such as a colonoscopy or flexible sigmoidoscopy) you may notice spotting of blood in your stool or on the toilet paper. If you underwent a bowel prep for your procedure, you may not have a normal bowel movement for a few days.  Please Note:  You might notice some irritation and congestion in your nose or some drainage.  This is from the oxygen used during your procedure.  There is no need for concern and it should clear up in a day or so.  SYMPTOMS TO REPORT IMMEDIATELY:  Following lower endoscopy (colonoscopy or flexible sigmoidoscopy):  Excessive amounts of blood in the stool  Significant tenderness or worsening of abdominal pains  Swelling of the abdomen that is new, acute  Fever of 100F or higher  Handouts on diverticulosis, hemorrhoids  and polyps given.  For urgent or emergent issues, a gastroenterologist can be reached at any hour by calling [phone]. Do not use MyChart messaging for  urgent concerns.    DIET:  We do recommend a small meal at first, but then you may proceed to your regular diet.  Drink plenty of fluids but you should avoid alcoholic beverages for 24 hours.  ACTIVITY:  You should plan to take it easy for the rest of today and you should NOT DRIVE or use heavy machinery until tomorrow (because of the sedation medicines used during the test).    FOLLOW UP: Our staff will call the number listed on your records the next business day following your procedure.  We will call around 7:15- 8:00 am to check on you and address any questions or concerns that you may have regarding the information given to you following your procedure. If we do not reach you, we will leave a message.     If any biopsies were taken you will be contacted by phone or by letter within the next 1-3 weeks.  Please call us at [phone] if you have not heard about the biopsies in 3 weeks.    SIGNATURES/CONFIDENTIALITY: You and/or your care partner have signed paperwork which will be entered into your electronic medical record.  These signatures attest to the fact that that the information above on your After Visit Summary has been reviewed and is understood.  Full responsibility of the confidentiality of this discharge information lies with you and/or your care-partner.

## 2022-05-13 NOTE — Progress Notes (Signed)
GASTROENTEROLOGY PROCEDURE H&P NOTE   Primary Care Physician: Leeroy Cha, MD    Reason for Procedure:   Melena, history of colon polyps  Plan:    Colonoscopy  Patient is appropriate for endoscopic procedure(s) in the ambulatory ([facility]) setting.  The nature of the procedure, as well as the risks, benefits, and alternatives were carefully and thoroughly reviewed with the patient. Ample time for discussion and questions allowed. The patient understood, was satisfied, and agreed to proceed.     HPI: Tara Moreno is a 77 y.o. female who presents for colonoscopy for evaluation of melena and history of colon polyps .  Patient was most recently seen in the Gastroenterology Clinic on 04/21/22.  No interval change in medical history since that appointment. Please refer to that note for full details regarding GI history and clinical presentation.   Past Medical History:  Diagnosis Date   Arthritis    "thumb joints, was in my knees, in my back" (07/01/2017)   Frequent urination at night    GERD (gastroesophageal reflux disease)    Hyperlipemia    Hypertension    PONV (postoperative nausea and vomiting)     Past Surgical History:  Procedure Laterality Date   ABDOMINAL HYSTERECTOMY     "partial"   ADRENAL GLAND SURGERY Left 1990s   "tumor on my adrenal gland removed"   AUGMENTATION MAMMAPLASTY  2000   BUNIONECTOMY Left    CARDIAC CATHETERIZATION     no PCI; had before renal tumor was removed   COLONOSCOPY     EXCISION MASS UPPER EXTREMETIES Right 07/01/2017   Procedure: RIGHT SCAPULAR MASS EXCISION;  Surgeon: Netta Cedars, MD;  Location: [facility];  Service: Orthopedics;  Laterality: Right;   FINGER DEBRIDEMENT  02/2022   JOINT REPLACEMENT     REVERSE SHOULDER ARTHROPLASTY Right 03/08/2014   Procedure: RIGHT TOTAL SHOULDER ARTHROPLASTY VERSES A REVERSE ARTHROPLASTY;  Surgeon: Augustin Schooling, MD;  Location: [facility];  Service: Orthopedics;  Laterality: Right;   REVERSE  SHOULDER ARTHROPLASTY Left 07/05/2014   Procedure: LEFT REVERSE SHOULDER ARTHROPLASTY;  Surgeon: Augustin Schooling, MD;  Location: [facility];  Service: Orthopedics;  Laterality: Left;   REVISION TOTAL SHOULDER ARTHROPLASTY Right 03/08/2014   dr Stanford Breed MASS EXCISION Right 07/01/2017   TONSILLECTOMY     TOTAL KNEE ARTHROPLASTY Bilateral 10/09/2014   Procedure: BILATERAL TOTAL KNEE ARTHROPLASTY;  Surgeon: Gaynelle Arabian, MD;  Location[facility];  Service: Orthopedics;  Laterality: Bilateral;  with general anesthesia   TUBAL LIGATION      Prior to Admission medications   Medication Sig Start Date End Date Taking? Authorizing Provider  amLODipine-olmesartan (AZOR) 10-40 MG tablet Take 1 tablet by mouth daily.   Yes [provider]  atorvastatin (LIPITOR) 10 MG tablet Take 10 mg by mouth daily.    Yes [provider]  calcium carbonate (OSCAL) 1500 (600 Ca) MG TABS tablet Take 600 mg of elemental calcium by mouth 2 (two) times daily with a meal.   Yes [provider]  Nyoka Cowden Tea, Camillia sinensis, (GREEN TEA EXTRACT PO) Take 500 mg by mouth daily.   Yes [provider]  Lecithin 1200 MG CAPS Take 1,200 mg by mouth daily.   Yes [provider]  MILK THISTLE EXTRACT PO Take 1 capsule by mouth daily.   Yes [provider]  Multiple Vitamin (MULTIVITAMIN WITH MINERALS) TABS tablet Take 1 tablet by mouth daily. ULTRA MEGA MULTIVITAMIN TIME RELEASE   Yes [provider]  Omega-3 Fatty Acids (SALMON OIL-1000 PO) Take 2,000 mg by mouth daily.   Yes [provider]  pantoprazole (PROTONIX) 40 MG tablet Take 40 mg by mouth daily.    Yes [provider]  Probiotic Product (PROBIOTIC PO) Take 1 capsule by mouth daily. ULTRA PROBIOTIC   Yes [provider]  Vitamin D, Ergocalciferol, (DRISDOL) 1.25 MG (50000 UNIT) CAPS capsule Take 50,000 Units by mouth every 7 (seven) days.   Yes [provider]    Current  Outpatient Medications  Medication Sig Dispense Refill   amLODipine-olmesartan (AZOR) 10-40 MG tablet Take 1 tablet by mouth daily.     atorvastatin (LIPITOR) 10 MG tablet Take 10 mg by mouth daily.      calcium carbonate (OSCAL) 1500 (600 Ca) MG TABS tablet Take 600 mg of elemental calcium by mouth 2 (two) times daily with a meal.     Green Tea, Camillia sinensis, (GREEN TEA EXTRACT PO) Take 500 mg by mouth daily.     Lecithin 1200 MG CAPS Take 1,200 mg by mouth daily.     MILK THISTLE EXTRACT PO Take 1 capsule by mouth daily.     Multiple Vitamin (MULTIVITAMIN WITH MINERALS) TABS tablet Take 1 tablet by mouth daily. ULTRA MEGA MULTIVITAMIN TIME RELEASE     Omega-3 Fatty Acids (SALMON OIL-1000 PO) Take 2,000 mg by mouth daily.     pantoprazole (PROTONIX) 40 MG tablet Take 40 mg by mouth daily.      Probiotic Product (PROBIOTIC PO) Take 1 capsule by mouth daily. ULTRA PROBIOTIC     Vitamin D, Ergocalciferol, (DRISDOL) 1.25 MG (50000 UNIT) CAPS capsule Take 50,000 Units by mouth every 7 (seven) days.     Current Facility-Administered Medications  Medication Dose Route Frequency Provider Last Rate Last Admin   0.9 %  sodium chloride infusion  500 mL Intravenous Once Sharyn Creamer, MD        Allergies as of 05/13/2022   (No Known Allergies)    Family History  Problem Relation Age of Onset   Hypertension Mother    Hypertension Father    Hypertension Sister    Hypertension Brother    Hypertension Other    Colon cancer Neg Hx    Liver disease Neg Hx    Esophageal cancer Neg Hx     Social History   Socioeconomic History   Marital status: Married    Spouse name: Not on file   Number of children: 2   Years of education: Not on file   Highest education level: Not on file  Occupational History   Occupation: Retired Nutritional therapist  Tobacco Use   Smoking status: Never   Smokeless tobacco: Never  Vaping Use   Vaping Use: Never used  Substance and Sexual Activity    Alcohol use: No   Drug use: No   Sexual activity: Yes  Other Topics Concern   Not on file  Social History Narrative   Not on file   Social Determinants of Health   Financial Resource Strain: Not on file  Food Insecurity: Not on file  Transportation Needs: Not on file  Physical Activity: Not on file  Stress: Not on file  Social Connections: Not on file  Intimate Partner Violence: Not on file    Physical Exam: Vital signs in last 24 hours: BP (!) 140/82   Pulse 80   Temp (!) 96.8 F (36 C) (Skin)   Ht '5\' 7"'$  (1.702 m)  Wt 257 lb (116.6 kg)   SpO2 97%   BMI 40.25 kg/m  GEN: NAD EYE: Sclerae anicteric ENT: MMM CV: Non-tachycardic Pulm: No increased WOB GI: Soft NEURO:  Alert & Oriented   Christia Reading, MD [facility]   05/13/2022 8:44 AM

## 2022-05-13 NOTE — Progress Notes (Signed)
Report to pacu rn. Vss. Care resumed by rn. 

## 2022-05-14 ENCOUNTER — Telehealth: Payer: Self-pay

## 2022-05-14 NOTE — Telephone Encounter (Signed)
Left message on follow up call. 

## 2022-05-19 ENCOUNTER — Encounter: Payer: Self-pay | Admitting: Internal Medicine

## 2022-06-01 NOTE — Progress Notes (Unsigned)
Cardiology Office Note:   Date:  06/03/2022  NAME:  Tara Moreno    MRN: [id number] DOB:  1945/05/28   PCP:  Horald Pollen, MD  Cardiologist:  None  Electrophysiologist:  None   Referring MD: Leeroy Cha,*   Chief Complaint  Patient presents with   Follow-up        History of Present Illness:   Tara Moreno is a 77 y.o. female with a hx of pericardial effusion, LBBB, LV dysfunction who presents for follow-up.  She reports she is doing well.  Describes an occasional fluttering in her chest.  Occurs with laying down.  Last seconds and resolved.  I informed her this is likely positional.  She tells me it is better when she lays on her left side.  No chest pain or pressure.  No shortness of breath.  Coronary CTA with minimal CAD.  She is on a statin.  Her blood pressure is controlled today.  Ejection fraction was 50%.  No signs of heart failure.  Her ESR was 94.  This has not been worked up further.  We discussed rechecking this as well as a TSH.  All of her other lab work is within limits.  Her exam is normal.  She plans to go [facility] the [facility] next [facility].  She does need help getting into the morning session.  We will see if we can accommodate this.  Denies any other symptoms in office.  ESR 94 CRP 6   Problem List HTN HLD Obesity  LBBB Pericardial effusion  -Covid 19 infection 02/2022 6. Coronary calcium  -CAC 12.5 (49th percentile) -<25% stenosis  7. Systolic HF -EF 78-24%  Past Medical History: Past Medical History:  Diagnosis Date   Arthritis    "thumb joints, was in my knees, in my back" (07/01/2017)   Frequent urination at night    GERD (gastroesophageal reflux disease)    Hyperlipemia    Hypertension    PONV (postoperative nausea and vomiting)     Past Surgical History: Past Surgical History:  Procedure Laterality Date   ABDOMINAL HYSTERECTOMY     "partial"   ADRENAL GLAND SURGERY Left 1990s   "tumor on my adrenal gland  removed"   AUGMENTATION MAMMAPLASTY  2000   BUNIONECTOMY Left    CARDIAC CATHETERIZATION     no PCI; had before renal tumor was removed   COLONOSCOPY     EXCISION MASS UPPER EXTREMETIES Right 07/01/2017   Procedure: RIGHT SCAPULAR MASS EXCISION;  Surgeon: Netta Cedars, MD;  Location: [facility];  Service: Orthopedics;  Laterality: Right;   FINGER DEBRIDEMENT  02/2022   JOINT REPLACEMENT     REVERSE SHOULDER ARTHROPLASTY Right 03/08/2014   Procedure: RIGHT TOTAL SHOULDER ARTHROPLASTY VERSES A REVERSE ARTHROPLASTY;  Surgeon: Augustin Schooling, MD;  Location: [facility];  Service: Orthopedics;  Laterality: Right;   REVERSE SHOULDER ARTHROPLASTY Left 07/05/2014   Procedure: LEFT REVERSE SHOULDER ARTHROPLASTY;  Surgeon: Augustin Schooling, MD;  Location: [facility];  Service: Orthopedics;  Laterality: Left;   REVISION TOTAL SHOULDER ARTHROPLASTY Right 03/08/2014   dr Stanford Breed MASS EXCISION Right 07/01/2017   TONSILLECTOMY     TOTAL KNEE ARTHROPLASTY Bilateral 10/09/2014   Procedure: BILATERAL TOTAL KNEE ARTHROPLASTY;  Surgeon: Gaynelle Arabian, MD;  Location: [facility];  Service: Orthopedics;  Laterality: Bilateral;  with general anesthesia   TUBAL LIGATION      Current Medications: Current Meds  Medication Sig   amLODipine-olmesartan (AZOR) 10-40  MG tablet Take 1 tablet by mouth daily.   atorvastatin (LIPITOR) 10 MG tablet Take 10 mg by mouth daily.    calcium carbonate (OSCAL) 1500 (600 Ca) MG TABS tablet Take 600 mg of elemental calcium by mouth 2 (two) times daily with a meal.   Green Tea, Camillia sinensis, (GREEN TEA EXTRACT PO) Take 500 mg by mouth daily.   Lecithin 1200 MG CAPS Take 1,200 mg by mouth daily.   MILK THISTLE EXTRACT PO Take 1 capsule by mouth daily.   Multiple Vitamin (MULTIVITAMIN WITH MINERALS) TABS tablet Take 1 tablet by mouth daily. ULTRA MEGA MULTIVITAMIN TIME RELEASE   Omega-3 Fatty Acids (SALMON OIL-1000 PO) Take 2,000 mg by mouth daily.   pantoprazole (PROTONIX) 40 MG  tablet Take 40 mg by mouth daily.    Probiotic Product (PROBIOTIC PO) Take 1 capsule by mouth daily. ULTRA PROBIOTIC   Vitamin D, Ergocalciferol, (DRISDOL) 1.25 MG (50000 UNIT) CAPS capsule Take 50,000 Units by mouth every 7 (seven) days.     Allergies:    Patient has no known allergies.   Social History: Social History   Socioeconomic History   Marital status: Married    Spouse name: Not on file   Number of children: 2   Years of education: Not on file   Highest education level: Not on file  Occupational History   Occupation: Retired Nutritional therapist  Tobacco Use   Smoking status: Never   Smokeless tobacco: Never  Vaping Use   Vaping Use: Never used  Substance and Sexual Activity   Alcohol use: No   Drug use: No   Sexual activity: Yes  Other Topics Concern   Not on file  Social History Narrative   Not on file   Social Determinants of Health   Financial Resource Strain: Not on file  Food Insecurity: Not on file  Transportation Needs: Not on file  Physical Activity: Not on file  Stress: Not on file  Social Connections: Not on file     Family History: The patient's family history includes Hypertension in her brother, father, mother, sister, and another family member. There is no history of Colon cancer, Liver disease, or Esophageal cancer.  ROS:   All other ROS reviewed and negative. Pertinent positives noted in the HPI.     EKGs/Labs/Other Studies Reviewed:   The following studies were personally reviewed by me today:  TTE 04/20/2022  1. Left ventricular ejection fraction, by estimation, is 45 to 50%. The  left ventricle has mildly decreased function. The left ventricle  demonstrates global hypokinesis. There is mild concentric left ventricular  hypertrophy. Left ventricular diastolic  parameters are consistent with Grade I diastolic dysfunction (impaired  relaxation).   2. Right ventricular systolic function is normal. The right ventricular  size is  normal. There is normal pulmonary artery systolic pressure.   3. A small pericardial effusion is present. The pericardial effusion is  posterior to the left ventricle. There is no evidence of cardiac  tamponade.   4. The mitral valve is normal in structure. Trivial mitral valve  regurgitation. No evidence of mitral stenosis.   5. The aortic valve is tricuspid. Aortic valve regurgitation is not  visualized. No aortic stenosis is present.   6. The inferior vena cava is normal in size with greater than 50%  respiratory variability, suggesting right atrial pressure of 3 mmHg.   Recent Labs: 02/13/2022: ALT 15; Hemoglobin 14.0; Platelets 261 03/09/2022: BUN 16; Creatinine, Ser 0.97; Potassium 4.7; Sodium  138   Recent Lipid Panel No results found for: "CHOL", "TRIG", "HDL", "CHOLHDL", "VLDL", "LDLCALC", "LDLDIRECT"  Physical Exam:   VS:  BP 120/68   Pulse 81   Ht '5\' 7"'$  (1.702 m)   Wt 260 lb (117.9 kg)   SpO2 98%   BMI 40.72 kg/m    Wt Readings from Last 3 Encounters:  06/03/22 260 lb (117.9 kg)  06/02/22 260 lb 6 oz (118.1 kg)  05/13/22 257 lb (116.6 kg)    General: Well nourished, well developed, in no acute distress Head: Atraumatic, normal size  Eyes: PEERLA, EOMI  Neck: Supple, no JVD Endocrine: No thryomegaly Cardiac: Normal S1, S2; RRR; no murmurs, rubs, or gallops Lungs: Clear to auscultation bilaterally, no wheezing, rhonchi or rales  Abd: Soft, nontender, no hepatomegaly  Ext: No edema, pulses 2+ Musculoskeletal: No deformities, BUE and BLE strength normal and equal Skin: Warm and dry, no rashes   Neuro: Alert and oriented to person, place, time, and situation, CNII-XII grossly intact, no focal deficits  Psych: Normal mood and affect   ASSESSMENT:   Tara Moreno is a 77 y.o. female who presents for the following: 1. Pericardial effusion   2. LBBB (left bundle branch block)   3. Chronic systolic heart failure (HCC)   4. Palpitations     PLAN:   1.  Pericardial effusion -Moderate pericardial effusion.  Diagnosed in December in the setting of COVID-19 infection.  Her CRP was normal.  ESR was elevated.  She has not had a TSH.  We will recheck both today.  I suspect this is a secondary problem.  No signs of tamponade today.  We will recheck her labs today.  She may need workup for her elevated ESR if persistently elevated.  She describes no rash.  No arthritis symptoms.  She does not appear to have any autoimmune disorder that I can tell.  This could have just been related to COVID.  However, her CRP was normal. -We will recheck labs today.  I will recheck an echo in 6 months to make sure that not worsening.  No symptoms of heart failure.  No signs of tamponade.  2. LBBB (left bundle branch block) 3. Chronic systolic heart failure (HCC) -EF 50%.  Suspect this is left bundle branch block related.  No signs of heart failure.  No strong indication to treat this aggressively.  Will continue with blood pressure control including olmesartan and amlodipine.  4. Palpitations -Infrequent episodes.  Improved with laying on her left side.  Would recommend to watch this for now.  Disposition: Return in about 6 months (around 12/02/2022).  Medication Adjustments/Labs and Tests Ordered: Current medicines are reviewed at length with the patient today.  Concerns regarding medicines are outlined above.  Orders Placed This Encounter  Procedures   Sedimentation rate   C-reactive protein   ECHOCARDIOGRAM COMPLETE   No orders of the defined types were placed in this encounter.   Patient Instructions  Medication Instructions:  The current medical regimen is effective;  continue present plan and medications.  *If you need a refill on your cardiac medications before your next appointment, please call your pharmacy*   Lab Work: ESR, TSH today   If you have labs (blood work) drawn today and your tests are completely normal, you will receive your results  only by: [place] (if you have MyChart) OR A paper copy in the mail If you have any lab test that is abnormal or we need to  change your treatment, we will call you to review the results.   Testing/Procedures: Echocardiogram (6 months) - Your physician has requested that you have an echocardiogram. Echocardiography is a painless test that uses sound waves to create images of your heart. It provides your doctor with information about the size and shape of your heart and how well your heart's chambers and valves are working. This procedure takes approximately one hour. There are no restrictions for this procedure.     Follow-Up: At [facility], you and your health needs are our priority.  As part of our continuing mission to provide you with exceptional heart care, we have created designated Provider Care Teams.  These Care Teams include your primary Cardiologist (physician) and Advanced Practice Providers (APPs -  Physician Assistants and Nurse Practitioners) who all work together to provide you with the care you need, when you need it.  We recommend signing up for the patient portal called "MyChart".  Sign up information is provided on this After Visit Summary.  MyChart is used to connect with patients for Virtual Visits (Telemedicine).  Patients are able to view lab/test results, encounter notes, upcoming appointments, etc.  Non-urgent messages can be sent to your provider as well.   To learn more about what you can do with MyChart, go to [email or web address].    Your next appointment:   6 month(s)  Provider:   Eleonore Chiquito, MD      Time Spent with Patient: I have spent a total of 35 minutes with patient reviewing hospital notes, telemetry, EKGs, labs and examining the patient as well as establishing an assessment and plan that was discussed with the patient.  > 50% of time was spent in direct patient care.  Signed, Addison Naegeli. Audie Box, MD, [facility]  [address], [place], [place] [address] [phone]  06/03/2022 4:24 PM

## 2022-06-02 ENCOUNTER — Encounter: Payer: Self-pay | Admitting: Emergency Medicine

## 2022-06-02 ENCOUNTER — Ambulatory Visit (INDEPENDENT_AMBULATORY_CARE_PROVIDER_SITE_OTHER): Payer: Medicare Other | Admitting: Emergency Medicine

## 2022-06-02 VITALS — BP 128/76 | HR 79 | Temp 98.0°F | Ht 67.0 in | Wt 260.4 lb

## 2022-06-02 DIAGNOSIS — I10 Essential (primary) hypertension: Secondary | ICD-10-CM

## 2022-06-02 DIAGNOSIS — Z7689 Persons encountering health services in other specified circumstances: Secondary | ICD-10-CM | POA: Diagnosis not present

## 2022-06-02 DIAGNOSIS — E785 Hyperlipidemia, unspecified: Secondary | ICD-10-CM | POA: Diagnosis not present

## 2022-06-02 DIAGNOSIS — K219 Gastro-esophageal reflux disease without esophagitis: Secondary | ICD-10-CM

## 2022-06-02 DIAGNOSIS — I3139 Other pericardial effusion (noninflammatory): Secondary | ICD-10-CM | POA: Diagnosis not present

## 2022-06-02 NOTE — Patient Instructions (Signed)
Health Maintenance After Age 75 After age 75, you are at a higher risk for certain long-term diseases and infections as well as injuries from falls. Falls are a major cause of broken bones and head injuries in people who are older than age 75. Getting regular preventive care can help to keep you healthy and well. Preventive care includes getting regular testing and making lifestyle changes as recommended by your health care provider. Talk with your health care provider about: Which screenings and tests you should have. A screening is a test that checks for a disease when you have no symptoms. A diet and exercise plan that is right for you. What should I know about screenings and tests to prevent falls? Screening and testing are the best ways to find a health problem early. Early diagnosis and treatment give you the best chance of managing medical conditions that are common after age 75. Certain conditions and lifestyle choices may make you more likely to have a fall. Your health care provider may recommend: Regular vision checks. Poor vision and conditions such as cataracts can make you more likely to have a fall. If you wear glasses, make sure to get your prescription updated if your vision changes. Medicine review. Work with your health care provider to regularly review all of the medicines you are taking, including over-the-counter medicines. Ask your health care provider about any side effects that may make you more likely to have a fall. Tell your health care provider if any medicines that you take make you feel dizzy or sleepy. Strength and balance checks. Your health care provider may recommend certain tests to check your strength and balance while standing, walking, or changing positions. Foot health exam. Foot pain and numbness, as well as not wearing proper footwear, can make you more likely to have a fall. Screenings, including: Osteoporosis screening. Osteoporosis is a condition that causes  the bones to get weaker and break more easily. Blood pressure screening. Blood pressure changes and medicines to control blood pressure can make you feel dizzy. Depression screening. You may be more likely to have a fall if you have a fear of falling, feel depressed, or feel unable to do activities that you used to do. Alcohol use screening. Using too much alcohol can affect your balance and may make you more likely to have a fall. Follow these instructions at home: Lifestyle Do not drink alcohol if: Your health care provider tells you not to drink. If you drink alcohol: Limit how much you have to: 0-1 drink a day for women. 0-2 drinks a day for men. Know how much alcohol is in your drink. In the [place] one drink equals one 12 oz bottle of beer (355 mL), one 5 oz glass of wine (148 mL), or one 1 oz glass of hard liquor (44 mL). Do not use any products that contain nicotine or tobacco. These products include cigarettes, chewing tobacco, and vaping devices, such as e-cigarettes. If you need help quitting, ask your health care provider. Activity  Follow a regular exercise program to stay fit. This will help you maintain your balance. Ask your health care provider what types of exercise are appropriate for you. If you need a cane or walker, use it as recommended by your health care provider. Wear supportive shoes that have nonskid soles. Safety  Remove any tripping hazards, such as rugs, cords, and clutter. Install safety equipment such as grab bars in bathrooms and safety rails on stairs. Keep rooms and walkways   well-lit. General instructions Talk with your health care provider about your risks for falling. Tell your health care provider if: You fall. Be sure to tell your health care provider about all falls, even ones that seem minor. You feel dizzy, tiredness (fatigue), or off-balance. Take over-the-counter and prescription medicines only as told by your health care provider. These include  supplements. Eat a healthy diet and maintain a healthy weight. A healthy diet includes low-fat dairy products, low-fat (lean) meats, and fiber from whole grains, beans, and lots of fruits and vegetables. Stay current with your vaccines. Schedule regular health, dental, and eye exams. Summary Having a healthy lifestyle and getting preventive care can help to protect your health and wellness after age 75. Screening and testing are the best way to find a health problem early and help you avoid having a fall. Early diagnosis and treatment give you the best chance for managing medical conditions that are more common for people who are older than age 75. Falls are a major cause of broken bones and head injuries in people who are older than age 75. Take precautions to prevent a fall at home. Work with your health care provider to learn what changes you can make to improve your health and wellness and to prevent falls. This information is not intended to replace advice given to you by your health care provider. Make sure you discuss any questions you have with your health care provider. Document Revised: 09/15/2020 Document Reviewed: 09/15/2020 Elsevier Patient Education  2023 [employer]  

## 2022-06-02 NOTE — Assessment & Plan Note (Signed)
Well-controlled hypertension. Continue amlodipine-olmesartan 10-40 mg daily. Cardiovascular risks associated with hypertension discussed. BP Readings from Last 3 Encounters:  06/02/22 128/76  05/13/22 122/71  04/21/22 120/78

## 2022-06-02 NOTE — Assessment & Plan Note (Signed)
Stable.  Diet and nutrition discussed. Continue atorvastatin 10 mg daily.

## 2022-06-02 NOTE — Assessment & Plan Note (Signed)
Stable and asymptomatic at present time. Continues pantoprazole 40 mg daily.

## 2022-06-02 NOTE — Assessment & Plan Note (Signed)
Moderate pericardial effusion as seen on recent CT scans of chest.  Unknown etiology. Had recent echocardiogram done.Cardiology follow-up visit tomorrow to discuss results.

## 2022-06-02 NOTE — Progress Notes (Signed)
Tara Moreno 77 y.o.   Chief Complaint  Patient presents with   New Patient (Initial Visit)    No concerns     HISTORY OF PRESENT ILLNESS: This is a 77 y.o. female first visit to this office, here to establish care with me. History of hypertension and dyslipidemia. Recent CT T scan of chest showed moderate pericardial effusion Scheduled to see cardiologist for follow-up visit tomorrow.  Already had echocardiogram done. Cardiac CT scan did not show any significant coronary artery disease.  HPI   Prior to Admission medications   Medication Sig Start Date End Date Taking? Authorizing Provider  amLODipine-olmesartan (AZOR) 10-40 MG tablet Take 1 tablet by mouth daily.   Yes [provider]  atorvastatin (LIPITOR) 10 MG tablet Take 10 mg by mouth daily.    Yes [provider]  calcium carbonate (OSCAL) 1500 (600 Ca) MG TABS tablet Take 600 mg of elemental calcium by mouth 2 (two) times daily with a meal.   Yes [provider]  Nyoka Cowden Tea, Camillia sinensis, (GREEN TEA EXTRACT PO) Take 500 mg by mouth daily.   Yes [provider]  Lecithin 1200 MG CAPS Take 1,200 mg by mouth daily.   Yes [provider]  MILK THISTLE EXTRACT PO Take 1 capsule by mouth daily.   Yes [provider]  Multiple Vitamin (MULTIVITAMIN WITH MINERALS) TABS tablet Take 1 tablet by mouth daily. ULTRA MEGA MULTIVITAMIN TIME RELEASE   Yes [provider]  Omega-3 Fatty Acids (SALMON OIL-1000 PO) Take 2,000 mg by mouth daily.   Yes [provider]  pantoprazole (PROTONIX) 40 MG tablet Take 40 mg by mouth daily.    Yes [provider]  Probiotic Product (PROBIOTIC PO) Take 1 capsule by mouth daily. ULTRA PROBIOTIC   Yes [provider]  Vitamin D, Ergocalciferol, (DRISDOL) 1.25 MG (50000 UNIT) CAPS capsule Take 50,000 Units by mouth every 7 (seven) days.   Yes [provider]    No Known Allergies  Patient Active  Problem List   Diagnosis Date Noted   Essential hypertension 06/02/2022   Dyslipidemia 06/02/2022   History of excision of mass 07/01/2017   OA (osteoarthritis) of knee 10/09/2014   S/P shoulder replacement 07/05/2014   Arthritis of shoulder region, right, degenerative 03/08/2014    Past Medical History:  Diagnosis Date   Arthritis    "thumb joints, was in my knees, in my back" (07/01/2017)   Frequent urination at night    GERD (gastroesophageal reflux disease)    Hyperlipemia    Hypertension    PONV (postoperative nausea and vomiting)     Past Surgical History:  Procedure Laterality Date   ABDOMINAL HYSTERECTOMY     "partial"   ADRENAL GLAND SURGERY Left 1990s   "tumor on my adrenal gland removed"   AUGMENTATION MAMMAPLASTY  2000   BUNIONECTOMY Left    CARDIAC CATHETERIZATION     no PCI; had before renal tumor was removed   COLONOSCOPY     EXCISION MASS UPPER EXTREMETIES Right 07/01/2017   Procedure: RIGHT SCAPULAR MASS EXCISION;  Surgeon: Netta Cedars, MD;  Location: [facility];  Service: Orthopedics;  Laterality: Right;   FINGER DEBRIDEMENT  02/2022   JOINT REPLACEMENT     REVERSE SHOULDER ARTHROPLASTY Right 03/08/2014   Procedure: RIGHT TOTAL SHOULDER ARTHROPLASTY VERSES A REVERSE ARTHROPLASTY;  Surgeon: Augustin Schooling, MD;  Location: [facility];  Service: Orthopedics;  Laterality: Right;   REVERSE SHOULDER ARTHROPLASTY Left 07/05/2014  Procedure: LEFT REVERSE SHOULDER ARTHROPLASTY;  Surgeon: Augustin Schooling, MD;  Location: [facility];  Service: Orthopedics;  Laterality: Left;   REVISION TOTAL SHOULDER ARTHROPLASTY Right 03/08/2014   dr Stanford Breed MASS EXCISION Right 07/01/2017   TONSILLECTOMY     TOTAL KNEE ARTHROPLASTY Bilateral 10/09/2014   Procedure: BILATERAL TOTAL KNEE ARTHROPLASTY;  Surgeon: Gaynelle Arabian, MD;  Location: [facility];  Service: Orthopedics;  Laterality: Bilateral;  with general anesthesia   TUBAL LIGATION      Social History   Socioeconomic History    Marital status: Married    Spouse name: Not on file   Number of children: 2   Years of education: Not on file   Highest education level: Not on file  Occupational History   Occupation: Retired Nutritional therapist  Tobacco Use   Smoking status: Never   Smokeless tobacco: Never  Vaping Use   Vaping Use: Never used  Substance and Sexual Activity   Alcohol use: No   Drug use: No   Sexual activity: Yes  Other Topics Concern   Not on file  Social History Narrative   Not on file   Social Determinants of Health   Financial Resource Strain: Not on file  Food Insecurity: Not on file  Transportation Needs: Not on file  Physical Activity: Not on file  Stress: Not on file  Social Connections: Not on file  Intimate Partner Violence: Not on file    Family History  Problem Relation Age of Onset   Hypertension Mother    Hypertension Father    Hypertension Sister    Hypertension Brother    Hypertension Other    Colon cancer Neg Hx    Liver disease Neg Hx    Esophageal cancer Neg Hx      Review of Systems  Constitutional: Negative.  Negative for chills and fever.  HENT: Negative.  Negative for congestion and sore throat.   Respiratory: Negative.  Negative for cough and shortness of breath.   Cardiovascular: Negative.  Negative for chest pain and palpitations.  Gastrointestinal:  Negative for abdominal pain, diarrhea, nausea and vomiting.  Genitourinary: Negative.  Negative for dysuria and hematuria.  Skin: Negative.  Negative for rash.  Neurological: Negative.  Negative for dizziness and headaches.  All other systems reviewed and are negative.  Today's Vitals   06/02/22 1509  BP: 128/76  Pulse: 79  Temp: 98 F (36.7 C)  TempSrc: Oral  SpO2: 98%  Weight: 260 lb 6 oz (118.1 kg)  Height: '5\' 7"'$  (1.702 m)   Body mass index is 40.78 kg/m.   Physical Exam Vitals reviewed.  Constitutional:      Appearance: Normal appearance.  HENT:     Head: Normocephalic.   Eyes:     Extraocular Movements: Extraocular movements intact.     Pupils: Pupils are equal, round, and reactive to light.  Cardiovascular:     Rate and Rhythm: Normal rate and regular rhythm.     Pulses: Normal pulses.     Heart sounds: Normal heart sounds.  Pulmonary:     Effort: Pulmonary effort is normal.     Breath sounds: Normal breath sounds.  Musculoskeletal:        General: Normal range of motion.     Cervical back: No tenderness.  Lymphadenopathy:     Cervical: No cervical adenopathy.  Skin:    General: Skin is warm and dry.  Neurological:     General: No focal deficit present.  Mental Status: She is alert and oriented to person, place, and time.  Psychiatric:        Mood and Affect: Mood normal.        Behavior: Behavior normal.      ASSESSMENT & PLAN: A total of 47 minutes was spent with the patient and counseling/coordination of care regarding preparing for this visit, review of available medical records, review of most recent imaging reports, review of most recent echocardiogram report, establishing care with me, review of multiple chronic medical conditions under management, review of all medications, cardiovascular risks associated with hypertension and dyslipidemia, review of most recent CT scan of chest reports, prognosis, documentation, and need for follow-up in 3 months.  Problem List Items Addressed This Visit       Cardiovascular and Mediastinum   Essential hypertension - Primary    Well-controlled hypertension. Continue amlodipine-olmesartan 10-40 mg daily. Cardiovascular risks associated with hypertension discussed. BP Readings from Last 3 Encounters:  06/02/22 128/76  05/13/22 122/71  04/21/22 120/78        Pericardial effusion    Moderate pericardial effusion as seen on recent CT scans of chest.  Unknown etiology. Had recent echocardiogram done.Cardiology follow-up visit tomorrow to discuss results.        Digestive   Gastroesophageal  reflux disease without esophagitis    Stable and asymptomatic at present time. Continues pantoprazole 40 mg daily.        Other   Dyslipidemia    Stable.  Diet and nutrition discussed. Continue atorvastatin 10 mg daily.       Other Visit Diagnoses     Encounter to establish care          Patient Instructions  Health Maintenance After Age 42 After age 58, you are at a higher risk for certain long-term diseases and infections as well as injuries from falls. Falls are a major cause of broken bones and head injuries in people who are older than age 7. Getting regular preventive care can help to keep you healthy and well. Preventive care includes getting regular testing and making lifestyle changes as recommended by your health care provider. Talk with your health care provider about: Which screenings and tests you should have. A screening is a test that checks for a disease when you have no symptoms. A diet and exercise plan that is right for you. What should I know about screenings and tests to prevent falls? Screening and testing are the best ways to find a health problem early. Early diagnosis and treatment give you the best chance of managing medical conditions that are common after age 68. Certain conditions and lifestyle choices may make you more likely to have a fall. Your health care provider may recommend: Regular vision checks. Poor vision and conditions such as cataracts can make you more likely to have a fall. If you wear glasses, make sure to get your prescription updated if your vision changes. Medicine review. Work with your health care provider to regularly review all of the medicines you are taking, including over-the-counter medicines. Ask your health care provider about any side effects that may make you more likely to have a fall. Tell your health care provider if any medicines that you take make you feel dizzy or sleepy. Strength and balance checks. Your health care  provider may recommend certain tests to check your strength and balance while standing, walking, or changing positions. Foot health exam. Foot pain and numbness, as well as not wearing proper footwear,  can make you more likely to have a fall. Screenings, including: Osteoporosis screening. Osteoporosis is a condition that causes the bones to get weaker and break more easily. Blood pressure screening. Blood pressure changes and medicines to control blood pressure can make you feel dizzy. Depression screening. You may be more likely to have a fall if you have a fear of falling, feel depressed, or feel unable to do activities that you used to do. Alcohol use screening. Using too much alcohol can affect your balance and may make you more likely to have a fall. Follow these instructions at home: Lifestyle Do not drink alcohol if: Your health care provider tells you not to drink. If you drink alcohol: Limit how much you have to: 0-1 drink a day for women. 0-2 drinks a day for men. Know how much alcohol is in your drink. In the [place] one drink equals one 12 oz bottle of beer (355 mL), one 5 oz glass of wine (148 mL), or one 1 oz glass of hard liquor (44 mL). Do not use any products that contain nicotine or tobacco. These products include cigarettes, chewing tobacco, and vaping devices, such as e-cigarettes. If you need help quitting, ask your health care provider. Activity  Follow a regular exercise program to stay fit. This will help you maintain your balance. Ask your health care provider what types of exercise are appropriate for you. If you need a cane or walker, use it as recommended by your health care provider. Wear supportive shoes that have nonskid soles. Safety  Remove any tripping hazards, such as rugs, cords, and clutter. Install safety equipment such as grab bars in bathrooms and safety rails on stairs. Keep rooms and walkways well-lit. General instructions Talk with your health  care provider about your risks for falling. Tell your health care provider if: You fall. Be sure to tell your health care provider about all falls, even ones that seem minor. You feel dizzy, tiredness (fatigue), or off-balance. Take over-the-counter and prescription medicines only as told by your health care provider. These include supplements. Eat a healthy diet and maintain a healthy weight. A healthy diet includes low-fat dairy products, low-fat (lean) meats, and fiber from whole grains, beans, and lots of fruits and vegetables. Stay current with your vaccines. Schedule regular health, dental, and eye exams. Summary Having a healthy lifestyle and getting preventive care can help to protect your health and wellness after age 1. Screening and testing are the best way to find a health problem early and help you avoid having a fall. Early diagnosis and treatment give you the best chance for managing medical conditions that are more common for people who are older than age 71. Falls are a major cause of broken bones and head injuries in people who are older than age 83. Take precautions to prevent a fall at home. Work with your health care provider to learn what changes you can make to improve your health and wellness and to prevent falls. This information is not intended to replace advice given to you by your health care provider. Make sure you discuss any questions you have with your health care provider. Document Revised: 09/15/2020 Document Reviewed: 09/15/2020 Elsevier Patient Education  [place], MD [facility]

## 2022-06-03 ENCOUNTER — Encounter: Payer: Self-pay | Admitting: Cardiovascular Disease

## 2022-06-03 ENCOUNTER — Ambulatory Visit: Payer: Medicare Other | Attending: Cardiovascular Disease | Admitting: Cardiovascular Disease

## 2022-06-03 VITALS — BP 120/68 | HR 81 | Ht 67.0 in | Wt 260.0 lb

## 2022-06-03 DIAGNOSIS — I3139 Other pericardial effusion (noninflammatory): Secondary | ICD-10-CM | POA: Diagnosis not present

## 2022-06-03 DIAGNOSIS — R002 Palpitations: Secondary | ICD-10-CM

## 2022-06-03 DIAGNOSIS — I5022 Chronic systolic (congestive) heart failure: Secondary | ICD-10-CM

## 2022-06-03 DIAGNOSIS — I447 Left bundle-branch block, unspecified: Secondary | ICD-10-CM

## 2022-06-03 NOTE — Patient Instructions (Addendum)
Medication Instructions:  The current medical regimen is effective;  continue present plan and medications.  *If you need a refill on your cardiac medications before your next appointment, please call your pharmacy*   Lab Work: ESR, TSH today   If you have labs (blood work) drawn today and your tests are completely normal, you will receive your results only by: [place] (if you have MyChart) OR A paper copy in the mail If you have any lab test that is abnormal or we need to change your treatment, we will call you to review the results.   Testing/Procedures: Echocardiogram (6 months) - Your physician has requested that you have an echocardiogram. Echocardiography is a painless test that uses sound waves to create images of your heart. It provides your doctor with information about the size and shape of your heart and how well your heart's chambers and valves are working. This procedure takes approximately one hour. There are no restrictions for this procedure.     Follow-Up: At [facility], you and your health needs are our priority.  As part of our continuing mission to provide you with exceptional heart care, we have created designated Provider Care Teams.  These Care Teams include your primary Cardiologist (physician) and Advanced Practice Providers (APPs -  Physician Assistants and Nurse Practitioners) who all work together to provide you with the care you need, when you need it.  We recommend signing up for the patient portal called "MyChart".  Sign up information is provided on this After Visit Summary.  MyChart is used to connect with patients for Virtual Visits (Telemedicine).  Patients are able to view lab/test results, encounter notes, upcoming appointments, etc.  Non-urgent messages can be sent to your provider as well.   To learn more about what you can do with MyChart, go to [email or web address].    Your next appointment:   6 month(s)  Provider:   Eleonore Chiquito, MD

## 2022-06-04 ENCOUNTER — Telehealth: Payer: Self-pay

## 2022-06-04 LAB — C-REACTIVE PROTEIN: CRP: 5 mg/L (ref 0–10)

## 2022-06-04 LAB — SEDIMENTATION RATE: Sed Rate: 43 mm/hr — ABNORMAL HIGH (ref 0–40)

## 2022-06-04 NOTE — Telephone Encounter (Signed)
-----  Message from Berniece Salines, DO sent at 06/04/2022  9:27 AM EST ----- Regarding: RE: [facility] Attendee Tara Moreno,  Could you please get her email address - we can get her in but we need her email address.  KT ----- Message ----- From: Geralynn Rile, MD Sent: 06/03/2022   7:15 PM EST To: Caprice Beaver, LPN; Berniece Salines, DO Subject: [facility] Event Attendee                   Kardie:  Mrs. Austad was not able to register for the morning session but really wanted to come. Anyway, we could squeeze her in?  If so, we can have Tara Moreno call her and let her know.   Thanks!  -[employer]

## 2022-06-04 NOTE — Telephone Encounter (Signed)
Pt states that she was to call back and leave updated email address. Email: [email or web address]

## 2022-06-04 NOTE — Telephone Encounter (Signed)
I called patient, Tara Moreno to call back to give email address.   Left call back number.

## 2022-06-22 ENCOUNTER — Ambulatory Visit: Payer: Medicare Other | Admitting: Internal Medicine

## 2022-07-29 DIAGNOSIS — R2232 Localized swelling, mass and lump, left upper limb: Secondary | ICD-10-CM | POA: Diagnosis not present

## 2022-08-04 ENCOUNTER — Other Ambulatory Visit: Payer: Self-pay

## 2022-08-04 DIAGNOSIS — R2232 Localized swelling, mass and lump, left upper limb: Secondary | ICD-10-CM | POA: Diagnosis not present

## 2022-08-04 DIAGNOSIS — D2111 Benign neoplasm of connective and other soft tissue of right upper limb, including shoulder: Secondary | ICD-10-CM | POA: Diagnosis not present

## 2022-08-04 DIAGNOSIS — L905 Scar conditions and fibrosis of skin: Secondary | ICD-10-CM | POA: Diagnosis not present

## 2022-09-01 ENCOUNTER — Encounter: Payer: Self-pay | Admitting: Emergency Medicine

## 2022-09-01 ENCOUNTER — Ambulatory Visit (INDEPENDENT_AMBULATORY_CARE_PROVIDER_SITE_OTHER): Payer: Medicare Other | Admitting: Emergency Medicine

## 2022-09-01 VITALS — BP 128/82 | HR 76 | Temp 98.0°F | Ht 67.0 in | Wt 260.2 lb

## 2022-09-01 DIAGNOSIS — E785 Hyperlipidemia, unspecified: Secondary | ICD-10-CM

## 2022-09-01 DIAGNOSIS — Z1159 Encounter for screening for other viral diseases: Secondary | ICD-10-CM | POA: Diagnosis not present

## 2022-09-01 DIAGNOSIS — Z0000 Encounter for general adult medical examination without abnormal findings: Secondary | ICD-10-CM | POA: Diagnosis not present

## 2022-09-01 DIAGNOSIS — K219 Gastro-esophageal reflux disease without esophagitis: Secondary | ICD-10-CM | POA: Diagnosis not present

## 2022-09-01 DIAGNOSIS — I10 Essential (primary) hypertension: Secondary | ICD-10-CM

## 2022-09-01 DIAGNOSIS — Z13228 Encounter for screening for other metabolic disorders: Secondary | ICD-10-CM

## 2022-09-01 DIAGNOSIS — Z130 Encounter for screening for diseases of the blood and blood-forming organs and certain disorders involving the immune mechanism: Secondary | ICD-10-CM | POA: Diagnosis not present

## 2022-09-01 DIAGNOSIS — Z13220 Encounter for screening for lipoid disorders: Secondary | ICD-10-CM

## 2022-09-01 DIAGNOSIS — Z1329 Encounter for screening for other suspected endocrine disorder: Secondary | ICD-10-CM

## 2022-09-01 LAB — CBC WITH DIFFERENTIAL/PLATELET
Basophils Absolute: 0.1 10*3/uL (ref 0.0–0.1)
Basophils Relative: 0.9 % (ref 0.0–3.0)
Eosinophils Absolute: 0.1 10*3/uL (ref 0.0–0.7)
Eosinophils Relative: 1.3 % (ref 0.0–5.0)
HCT: 41.9 % (ref 36.0–46.0)
Hemoglobin: 13.8 g/dL (ref 12.0–15.0)
Lymphocytes Relative: 22 % (ref 12.0–46.0)
Lymphs Abs: 1.7 10*3/uL (ref 0.7–4.0)
MCHC: 32.9 g/dL (ref 30.0–36.0)
MCV: 89.7 fl (ref 78.0–100.0)
Monocytes Absolute: 0.7 10*3/uL (ref 0.1–1.0)
Monocytes Relative: 9.7 % (ref 3.0–12.0)
Neutro Abs: 5.1 10*3/uL (ref 1.4–7.7)
Neutrophils Relative %: 66.1 % (ref 43.0–77.0)
Platelets: 248 10*3/uL (ref 150.0–400.0)
RBC: 4.67 Mil/uL (ref 3.87–5.11)
RDW: 13.8 % (ref 11.5–15.5)
WBC: 7.7 10*3/uL (ref 4.0–10.5)

## 2022-09-01 LAB — COMPREHENSIVE METABOLIC PANEL
ALT: 14 U/L (ref 0–35)
AST: 20 U/L (ref 0–37)
Albumin: 4.2 g/dL (ref 3.5–5.2)
Alkaline Phosphatase: 96 U/L (ref 39–117)
BUN: 19 mg/dL (ref 6–23)
CO2: 30 mEq/L (ref 19–32)
Calcium: 10.1 mg/dL (ref 8.4–10.5)
Chloride: 100 mEq/L (ref 96–112)
Creatinine, Ser: 1.01 mg/dL (ref 0.40–1.20)
GFR: 54.74 mL/min — ABNORMAL LOW (ref >60.00)
Glucose, Bld: 87 mg/dL (ref 70–99)
Potassium: 4.4 mEq/L (ref 3.5–5.1)
Sodium: 138 mEq/L (ref 135–145)
Total Bilirubin: 0.8 mg/dL (ref 0.2–1.2)
Total Protein: 7.8 g/dL (ref 6.0–8.3)

## 2022-09-01 LAB — LIPID PANEL
Cholesterol: 169 mg/dL (ref 0–200)
HDL: 51.9 mg/dL (ref >39.00)
LDL Cholesterol: 93 mg/dL (ref 0–99)
NonHDL: 117.07
Total CHOL/HDL Ratio: 3
Triglycerides: 122 mg/dL (ref 0.0–149.0)
VLDL: 24.4 mg/dL (ref 0.0–40.0)

## 2022-09-01 LAB — HEMOGLOBIN A1C: Hgb A1c MFr Bld: 5 % (ref 4.6–6.5)

## 2022-09-01 NOTE — Patient Instructions (Signed)

## 2022-09-01 NOTE — Assessment & Plan Note (Signed)
Well-controlled hypertension Continue amlodipine-olmesartan 10-40 mg daily

## 2022-09-01 NOTE — Progress Notes (Signed)
Tara Moreno 77 y.o.   Chief Complaint  Patient presents with   Annual Exam    Patient is having issues with her left ear     HISTORY OF PRESENT ILLNESS: This is a 77 y.o. female here for annual exam. Uses hearing aids.  Sometimes gets a little discomfort on the left ear No other complaints or medical concerns today.  HPI   Prior to Admission medications   Medication Sig Start Date End Date Taking? Authorizing Provider  amLODipine-olmesartan (AZOR) 10-40 MG tablet Take 1 tablet by mouth daily.   Yes [provider]  Ascorbic Acid (VITAMIN C PO) Take by mouth.   Yes [provider]  atorvastatin (LIPITOR) 10 MG tablet Take 10 mg by mouth daily.    Yes [provider]  calcium carbonate (OSCAL) 1500 (600 Ca) MG TABS tablet Take 600 mg of elemental calcium by mouth 2 (two) times daily with a meal.   Yes [provider]  Lecithin 1200 MG CAPS Take 1,200 mg by mouth daily.   Yes [provider]  MILK THISTLE EXTRACT PO Take 1 capsule by mouth daily.   Yes [provider]  Multiple Vitamin (MULTIVITAMIN WITH MINERALS) TABS tablet Take 1 tablet by mouth daily. ULTRA MEGA MULTIVITAMIN TIME RELEASE   Yes [provider]  Omega-3 Fatty Acids (SALMON OIL-1000 PO) Take 2,000 mg by mouth daily.   Yes [provider]  pantoprazole (PROTONIX) 40 MG tablet Take 40 mg by mouth daily.    Yes [provider]  Probiotic Product (PROBIOTIC PO) Take 1 capsule by mouth daily. ULTRA PROBIOTIC   Yes [provider]  Pyridoxine HCl (VITAMIN B-6 PO) Take by mouth.   Yes [provider]  Vitamin D, Ergocalciferol, (DRISDOL) 1.25 MG (50000 UNIT) CAPS capsule Take 50,000 Units by mouth every 7 (seven) days.   Yes [provider]  VITAMIN E PO Take by mouth.   Yes [provider]  Janett Billow, Camillia sinensis, (GREEN TEA EXTRACT PO) Take 500 mg by mouth daily. Patient not taking: Reported on  09/01/2022    [provider]    No Known Allergies  Patient Active Problem List   Diagnosis Date Noted   Essential hypertension 06/02/2022   Dyslipidemia 06/02/2022   Pericardial effusion 06/02/2022   Gastroesophageal reflux disease without esophagitis 06/02/2022   History of excision of mass 07/01/2017   OA (osteoarthritis) of knee 10/09/2014   S/P shoulder replacement 07/05/2014   Arthritis of shoulder region, right, degenerative 03/08/2014    Past Medical History:  Diagnosis Date   Arthritis    "thumb joints, was in my knees, in my back" (07/01/2017)   Frequent urination at night    GERD (gastroesophageal reflux disease)    Hyperlipemia    Hypertension    PONV (postoperative nausea and vomiting)     Past Surgical History:  Procedure Laterality Date   ABDOMINAL HYSTERECTOMY     "partial"   ADRENAL GLAND SURGERY Left 1990s   "tumor on my adrenal gland removed"   AUGMENTATION MAMMAPLASTY  2000   BUNIONECTOMY Left    CARDIAC CATHETERIZATION     no PCI; had before renal tumor was removed   COLONOSCOPY     EXCISION MASS UPPER EXTREMETIES Right 07/01/2017   Procedure: RIGHT SCAPULAR MASS EXCISION;  Surgeon: Beverely Low, MD;  Location: [facility] OR;  Service: Orthopedics;  Laterality: Right;   FINGER DEBRIDEMENT  02/2022   JOINT REPLACEMENT     REVERSE  SHOULDER ARTHROPLASTY Right 03/08/2014   Procedure: RIGHT TOTAL SHOULDER ARTHROPLASTY VERSES A REVERSE ARTHROPLASTY;  Surgeon: Verlee Rossetti, MD;  Location: [facility] OR;  Service: Orthopedics;  Laterality: Right;   REVERSE SHOULDER ARTHROPLASTY Left 07/05/2014   Procedure: LEFT REVERSE SHOULDER ARTHROPLASTY;  Surgeon: Verlee Rossetti, MD;  Location: [facility] OR;  Service: Orthopedics;  Laterality: Left;   REVISION TOTAL SHOULDER ARTHROPLASTY Right 03/08/2014   dr Matilde Sprang MASS EXCISION Right 07/01/2017   TONSILLECTOMY     TOTAL KNEE ARTHROPLASTY Bilateral 10/09/2014   Procedure: BILATERAL TOTAL KNEE ARTHROPLASTY;   Surgeon: Ollen Gross, MD;  Location: [facility] ORS;  Service: Orthopedics;  Laterality: Bilateral;  with general anesthesia   TUBAL LIGATION      Social History   Socioeconomic History   Marital status: Married    Spouse name: Not on file   Number of children: 2   Years of education: Not on file   Highest education level: Not on file  Occupational History   Occupation: Retired Insurance underwriter  Tobacco Use   Smoking status: Never   Smokeless tobacco: Never  Vaping Use   Vaping Use: Never used  Substance and Sexual Activity   Alcohol use: No   Drug use: No   Sexual activity: Yes  Other Topics Concern   Not on file  Social History Narrative   Not on file   Social Determinants of Health   Financial Resource Strain: Not on file  Food Insecurity: Not on file  Transportation Needs: Not on file  Physical Activity: Not on file  Stress: Not on file  Social Connections: Not on file  Intimate Partner Violence: Not on file    Family History  Problem Relation Age of Onset   Hypertension Mother    Hypertension Father    Hypertension Sister    Hypertension Brother    Hypertension Other    Colon cancer Neg Hx    Liver disease Neg Hx    Esophageal cancer Neg Hx      Review of Systems  Constitutional: Negative.  Negative for chills and fever.  HENT: Negative.  Negative for congestion and sore throat.   Eyes: Negative.   Respiratory: Negative.  Negative for cough and shortness of breath.   Cardiovascular: Negative.  Negative for chest pain and palpitations.  Gastrointestinal:  Negative for abdominal pain, nausea and vomiting.  Genitourinary: Negative.  Negative for dysuria and hematuria.  Skin: Negative.  Negative for rash.  Neurological: Negative.  Negative for dizziness and headaches.  All other systems reviewed and are negative.   Vitals:   09/01/22 1525  BP: 128/82  Pulse: 76  Temp: 98 F (36.7 C)  SpO2: 94%    Physical Exam Vitals reviewed.   Constitutional:      Appearance: Normal appearance.  HENT:     Head: Normocephalic.     Left Ear: Tympanic membrane, ear canal and external ear normal.     Mouth/Throat:     Mouth: Mucous membranes are moist.     Pharynx: Oropharynx is clear.  Eyes:     Extraocular Movements: Extraocular movements intact.     Conjunctiva/sclera: Conjunctivae normal.     Pupils: Pupils are equal, round, and reactive to light.  Cardiovascular:     Rate and Rhythm: Normal rate and regular rhythm.     Pulses: Normal pulses.     Heart sounds: Normal heart sounds.  Pulmonary:     Effort: Pulmonary effort is normal.  Breath sounds: Normal breath sounds.  Abdominal:     Palpations: Abdomen is soft.     Tenderness: There is no abdominal tenderness.  Musculoskeletal:     Cervical back: No tenderness.  Lymphadenopathy:     Cervical: No cervical adenopathy.  Skin:    General: Skin is warm and dry.     Capillary Refill: Capillary refill takes less than 2 seconds.  Neurological:     General: No focal deficit present.     Mental Status: She is alert and oriented to person, place, and time.  Psychiatric:        Mood and Affect: Mood normal.        Behavior: Behavior normal.      ASSESSMENT & PLAN: Problem List Items Addressed This Visit       Cardiovascular and Mediastinum   Essential hypertension    Well-controlled hypertension Continue amlodipine-olmesartan 10-40 mg daily        Digestive   Gastroesophageal reflux disease without esophagitis    Stable and asymptomatic. Continues pantoprazole 40 daily      Other Visit Diagnoses     Routine general medical examination at a health care facility    -  Primary   Relevant Orders   CBC with Differential   Comprehensive metabolic panel   Hemoglobin A1c   Lipid panel   Hepatitis C antibody screen   Need for hepatitis C screening test       Relevant Orders   Hepatitis C antibody screen   Screening for deficiency anemia        Relevant Orders   CBC with Differential   Screening for lipoid disorders       Relevant Orders   Lipid panel   Screening for endocrine, metabolic and immunity disorder       Relevant Orders   Comprehensive metabolic panel   Hemoglobin A1c      Modifiable risk factors discussed with patient. Anticipatory guidance according to age provided. The following topics were also discussed: Social Determinants of Health Smoking.  Non-smoker Diet and nutrition and need to decrease amount of daily carbohydrate intake and daily calories and increase amount of plant based protein in her diet Benefits of exercise Cancer screening and review of most recent mammogram and colonoscopy reports Vaccinations reviewed and recommendations Cardiovascular risk assessment Mental health including depression and anxiety Fall and accident prevention  Patient Instructions  Health Maintenance, Female Adopting a healthy lifestyle and getting preventive care are important in promoting health and wellness. Ask your health care provider about: The right schedule for you to have regular tests and exams. Things you can do on your own to prevent diseases and keep yourself healthy. What should I know about diet, weight, and exercise? Eat a healthy diet  Eat a diet that includes plenty of vegetables, fruits, low-fat dairy products, and lean protein. Do not eat a lot of foods that are high in solid fats, added sugars, or sodium. Maintain a healthy weight Body mass index (BMI) is used to identify weight problems. It estimates body fat based on height and weight. Your health care provider can help determine your BMI and help you achieve or maintain a healthy weight. Get regular exercise Get regular exercise. This is one of the most important things you can do for your health. Most adults should: Exercise for at least 150 minutes each week. The exercise should increase your heart rate and make you sweat (moderate-intensity  exercise). Do strengthening exercises at least twice  a week. This is in addition to the moderate-intensity exercise. Spend less time sitting. Even light physical activity can be beneficial. Watch cholesterol and blood lipids Have your blood tested for lipids and cholesterol at 75 years of age, then have this test every 5 years. Have your cholesterol levels checked more often if: Your lipid or cholesterol levels are high. You are older than 75 years of age. You are at high risk for heart disease. What should I know about cancer screening? Depending on your health history and family history, you may need to have cancer screening at various ages. This may include screening for: Breast cancer. Cervical cancer. Colorectal cancer. Skin cancer. Lung cancer. What should I know about heart disease, diabetes, and high blood pressure? Blood pressure and heart disease High blood pressure causes heart disease and increases the risk of stroke. This is more likely to develop in people who have high blood pressure readings or are overweight. Have your blood pressure checked: Every 3-5 years if you are 19-37 years of age. Every year if you are 14 years old or older. Diabetes Have regular diabetes screenings. This checks your fasting blood sugar level. Have the screening done: Once every three years after age 28 if you are at a normal weight and have a low risk for diabetes. More often and at a younger age if you are overweight or have a high risk for diabetes. What should I know about preventing infection? Hepatitis B If you have a higher risk for hepatitis B, you should be screened for this virus. Talk with your health care provider to find out if you are at risk for hepatitis B infection. Hepatitis C Testing is recommended for: Everyone born from [birth year] through 1963. Anyone with known risk factors for hepatitis C. Sexually transmitted infections (STIs) Get screened for STIs, including gonorrhea and  chlamydia, if: You are sexually active and are younger than 75 years of age. You are older than 75 years of age and your health care provider tells you that you are at risk for this type of infection. Your sexual activity has changed since you were last screened, and you are at increased risk for chlamydia or gonorrhea. Ask your health care provider if you are at risk. Ask your health care provider about whether you are at high risk for HIV. Your health care provider may recommend a prescription medicine to help prevent HIV infection. If you choose to take medicine to prevent HIV, you should first get tested for HIV. You should then be tested every 3 months for as long as you are taking the medicine. Pregnancy If you are about to stop having your period (premenopausal) and you may become pregnant, seek counseling before you get pregnant. Take 400 to 800 micrograms (mcg) of folic acid every day if you become pregnant. Ask for birth control (contraception) if you want to prevent pregnancy. Osteoporosis and menopause Osteoporosis is a disease in which the bones lose minerals and strength with aging. This can result in bone fractures. If you are 98 years old or older, or if you are at risk for osteoporosis and fractures, ask your health care provider if you should: Be screened for bone loss. Take a calcium or vitamin D supplement to lower your risk of fractures. Be given hormone replacement therapy (HRT) to treat symptoms of menopause. Follow these instructions at home: Alcohol use Do not drink alcohol if: Your health care provider tells you not to drink. You are pregnant, may be  pregnant, or are planning to become pregnant. If you drink alcohol: Limit how much you have to: 0-1 drink a day. Know how much alcohol is in your drink. In the [place] one drink equals one 12 oz bottle of beer (355 mL), one 5 oz glass of wine (148 mL), or one 1 oz glass of hard liquor (44 mL). Lifestyle Do not use any  products that contain nicotine or tobacco. These products include cigarettes, chewing tobacco, and vaping devices, such as e-cigarettes. If you need help quitting, ask your health care provider. Do not use street drugs. Do not share needles. Ask your health care provider for help if you need support or information about quitting drugs. General instructions Schedule regular health, dental, and eye exams. Stay current with your vaccines. Tell your health care provider if: You often feel depressed. You have ever been abused or do not feel safe at home. Summary Adopting a healthy lifestyle and getting preventive care are important in promoting health and wellness. Follow your health care provider's instructions about healthy diet, exercising, and getting tested or screened for diseases. Follow your health care provider's instructions on monitoring your cholesterol and blood pressure. This information is not intended to replace advice given to you by your health care provider. Make sure you discuss any questions you have with your health care provider. Document Revised: 09/15/2020 Document Reviewed: 09/15/2020 Elsevier Patient Education  2023 Elsevier Inc.     Edwina Barth, MD [facility]

## 2022-09-01 NOTE — Assessment & Plan Note (Signed)
Stable and asymptomatic. Continues pantoprazole 40 daily

## 2022-09-01 NOTE — Assessment & Plan Note (Signed)
Chronic stable condition Continues atorvastatin 10 mg Higher doses gives her muscle cramps and spasms Lipid profile done today

## 2022-09-03 ENCOUNTER — Telehealth: Payer: Self-pay

## 2022-09-03 LAB — HEPATITIS C ANTIBODY: Hepatitis C Ab: NONREACTIVE

## 2022-09-03 NOTE — Telephone Encounter (Signed)
Pt was to know what was your decision for the increase of her Cholesterol meds?  ** Pt also wants to know what PCP wants to do about CT scan of her lungs as she was told in her last apptmnt he wanted her to do a repeat CT in 6 mnths.

## 2022-09-06 ENCOUNTER — Other Ambulatory Visit: Payer: Self-pay | Admitting: Emergency Medicine

## 2022-09-06 NOTE — Telephone Encounter (Signed)
Just reviewed reports from CT scan from November and October last year.  No mention of nodule or masses or suspicious "spots".  Just pericardial effusion.  No need for repeat CT scans.  Thanks.

## 2022-09-06 NOTE — Telephone Encounter (Signed)
Lipid profile was normal.  Continue atorvastatin.  Recommend follow-up with cardiologist and echocardiogram for follow-up of pericardial effusion.

## 2022-09-07 NOTE — Telephone Encounter (Signed)
Called patient and left message for patient to call office in regards to provider response

## 2022-09-08 ENCOUNTER — Telehealth: Payer: Self-pay | Admitting: Emergency Medicine

## 2022-09-08 NOTE — Telephone Encounter (Signed)
Called patient and left message again for patient to call office

## 2022-09-08 NOTE — Telephone Encounter (Signed)
Pt called returning phone call please reach back out to pt.

## 2022-09-10 ENCOUNTER — Other Ambulatory Visit: Payer: Self-pay | Admitting: *Deleted

## 2022-09-10 MED ORDER — ATORVASTATIN CALCIUM 10 MG PO TABS: 10.0000 mg | ORAL_TABLET | Freq: Every day | ORAL | 3 refills | Status: DC

## 2022-10-27 ENCOUNTER — Other Ambulatory Visit: Payer: Self-pay | Admitting: Emergency Medicine

## 2022-10-27 DIAGNOSIS — Z1231 Encounter for screening mammogram for malignant neoplasm of breast: Secondary | ICD-10-CM

## 2022-11-15 ENCOUNTER — Ambulatory Visit: Payer: Medicare Other

## 2022-11-16 DIAGNOSIS — M25511 Pain in right shoulder: Secondary | ICD-10-CM | POA: Diagnosis not present

## 2022-11-18 DIAGNOSIS — H5203 Hypermetropia, bilateral: Secondary | ICD-10-CM | POA: Diagnosis not present

## 2022-11-18 DIAGNOSIS — H2513 Age-related nuclear cataract, bilateral: Secondary | ICD-10-CM | POA: Diagnosis not present

## 2022-11-18 DIAGNOSIS — H52203 Unspecified astigmatism, bilateral: Secondary | ICD-10-CM | POA: Diagnosis not present

## 2022-12-02 ENCOUNTER — Ambulatory Visit (HOSPITAL_COMMUNITY): Payer: Medicare Other | Attending: Cardiovascular Disease

## 2022-12-02 ENCOUNTER — Ambulatory Visit: Payer: Medicare Other

## 2022-12-02 DIAGNOSIS — I3139 Other pericardial effusion (noninflammatory): Secondary | ICD-10-CM | POA: Insufficient documentation

## 2022-12-02 LAB — ECHOCARDIOGRAM COMPLETE
Area-P 1/2: 3.4 cm2
S' Lateral: 4 cm

## 2022-12-09 NOTE — Progress Notes (Signed)
Cardiology Office Note:   Date:  12/10/2022  NAME:  Tara Moreno    MRN: [id number] DOB:  1945-08-28   PCP:  Georgina Quint, MD  Cardiologist:  None  Electrophysiologist:  None   Referring MD: Georgina Quint, *   Chief Complaint  Patient presents with   Follow-up         History of Present Illness:   Tara Moreno is a 77 y.o. female with a hx of HTN, LBBB, CHF, pericardial effusion who presents for follow-up.  She reports she is doing well.  Denies any chest pains or trouble breathing.  Blood pressure is well-controlled.  No symptoms of congestive heart failure.  No fevers or chills.  No chest pain.  Lipids are at goal.  Denies any symptoms in office today.  Problem List Coronary calcium  -CAC 12.5 (49th percentile) -<25% stenosis  Systolic HF -EF 45-50% 04/20/2022 -EF 45-50% 12/02/2022 LBBB HTN HLD -T chol 169, HDL 52, LDL 93, TG 122 Obesity  LBBB Pericardial effusion  -Covid 19 infection 02/2022    Past Medical History: Past Medical History:  Diagnosis Date   Arthritis    "thumb joints, was in my knees, in my back" (07/01/2017)   CHF (congestive heart failure) (HCC)    Frequent urination at night    GERD (gastroesophageal reflux disease)    Hyperlipemia    Hypertension    PONV (postoperative nausea and vomiting)     Past Surgical History: Past Surgical History:  Procedure Laterality Date   ABDOMINAL HYSTERECTOMY     "partial"   ADRENAL GLAND SURGERY Left 1990s   "tumor on my adrenal gland removed"   AUGMENTATION MAMMAPLASTY  2000   BUNIONECTOMY Left    CARDIAC CATHETERIZATION     no PCI; had before renal tumor was removed   COLONOSCOPY     EXCISION MASS UPPER EXTREMETIES Right 07/01/2017   Procedure: RIGHT SCAPULAR MASS EXCISION;  Surgeon: Beverely Low, MD;  Location: [facility] OR;  Service: Orthopedics;  Laterality: Right;   FINGER DEBRIDEMENT  02/2022   JOINT REPLACEMENT     REVERSE SHOULDER ARTHROPLASTY Right 03/08/2014    Procedure: RIGHT TOTAL SHOULDER ARTHROPLASTY VERSES A REVERSE ARTHROPLASTY;  Surgeon: Verlee Rossetti, MD;  Location: [facility] OR;  Service: Orthopedics;  Laterality: Right;   REVERSE SHOULDER ARTHROPLASTY Left 07/05/2014   Procedure: LEFT REVERSE SHOULDER ARTHROPLASTY;  Surgeon: Verlee Rossetti, MD;  Location: [facility] OR;  Service: Orthopedics;  Laterality: Left;   REVISION TOTAL SHOULDER ARTHROPLASTY Right 03/08/2014   dr Matilde Sprang MASS EXCISION Right 07/01/2017   TONSILLECTOMY     TOTAL KNEE ARTHROPLASTY Bilateral 10/09/2014   Procedure: BILATERAL TOTAL KNEE ARTHROPLASTY;  Surgeon: Ollen Gross, MD;  Location: [facility] ORS;  Service: Orthopedics;  Laterality: Bilateral;  with general anesthesia   TUBAL LIGATION      Current Medications: Current Meds  Medication Sig   amLODipine-olmesartan (AZOR) 10-40 MG tablet Take 1 tablet by mouth daily.   atorvastatin (LIPITOR) 10 MG tablet Take 1 tablet (10 mg total) by mouth daily.   BIOTIN PO Take by mouth daily.   calcium carbonate (OSCAL) 1500 (600 Ca) MG TABS tablet Take 600 mg of elemental calcium by mouth 2 (two) times daily with a meal.   FLAXSEED, LINSEED, PO Take by mouth daily.   MILK THISTLE EXTRACT PO Take 1 capsule by mouth daily.   Multiple Vitamin (MULTIVITAMIN WITH MINERALS) TABS tablet Take 1 tablet by mouth daily. ULTRA MEGA MULTIVITAMIN  TIME RELEASE   Omega-3 Fatty Acids (SALMON OIL-1000 PO) Take 2,000 mg by mouth daily.   pantoprazole (PROTONIX) 40 MG tablet Take 40 mg by mouth daily.    Probiotic Product (PROBIOTIC PO) Take 1 capsule by mouth daily. ULTRA PROBIOTIC   Vitamin D, Ergocalciferol, (DRISDOL) 1.25 MG (50000 UNIT) CAPS capsule Take 50,000 Units by mouth every 7 (seven) days.     Allergies:    Patient has no known allergies.   Social History: Social History   Socioeconomic History   Marital status: Married    Spouse name: Not on file   Number of children: 2   Years of education: Not on file   Highest education  level: Not on file  Occupational History   Occupation: Retired Insurance underwriter  Tobacco Use   Smoking status: Never   Smokeless tobacco: Never  Vaping Use   Vaping status: Never Used  Substance and Sexual Activity   Alcohol use: No   Drug use: No   Sexual activity: Yes  Other Topics Concern   Not on file  Social History Narrative   Not on file   Social Determinants of Health   Financial Resource Strain: Not on file  Food Insecurity: Not on file  Transportation Needs: Not on file  Physical Activity: Not on file  Stress: Not on file  Social Connections: Not on file     Family History: The patient's family history includes Hypertension in her brother, father, mother, sister, and another family member. There is no history of Colon cancer, Liver disease, or Esophageal cancer.  ROS:   All other ROS reviewed and negative. Pertinent positives noted in the HPI.     EKGs/Labs/Other Studies Reviewed:   The following studies were personally reviewed by me today:  EKG:  EKG is ordered today.    EKG Interpretation Date/Time:  Friday December 10 2022 14:58:56 EDT Ventricular Rate:  80 PR Interval:  142 QRS Duration:  136 QT Interval:  410 QTC Calculation: 472 R Axis:   2  Text Interpretation: Normal sinus rhythm Left bundle branch block Confirmed by Lennie Odor ([id number]) on 12/10/2022 3:11:39 PM   Recent Labs: 09/01/2022: ALT 14; BUN 19; Creatinine, Ser 1.01; Hemoglobin 13.8; Platelets 248.0; Potassium 4.4; Sodium 138   Recent Lipid Panel    Component Value Date/Time   CHOL 169 09/01/2022 1602   TRIG 122.0 09/01/2022 1602   HDL 51.90 09/01/2022 1602   CHOLHDL 3 09/01/2022 1602   VLDL 24.4 09/01/2022 1602   LDLCALC 93 09/01/2022 1602    Physical Exam:   VS:  BP 115/69 (BP Location: Right Arm, Patient Position: Sitting, Cuff Size: Large)   Pulse 80   Ht 5\' 7"  (1.702 m)   Wt 261 lb 12.8 oz (118.8 kg)   SpO2 97%   BMI 41.00 kg/m    Wt Readings from Last 3  Encounters:  12/10/22 261 lb 12.8 oz (118.8 kg)  09/01/22 260 lb 4 oz (118 kg)  06/03/22 260 lb (117.9 kg)    General: Well nourished, well developed, in no acute distress Head: Atraumatic, normal size  Eyes: PEERLA, EOMI  Neck: Supple, no JVD Endocrine: No thryomegaly Cardiac: Normal S1, S2; RRR; no murmurs, rubs, or gallops Lungs: Clear to auscultation bilaterally, no wheezing, rhonchi or rales  Abd: Soft, nontender, no hepatomegaly  Ext: No edema, pulses 2+ Musculoskeletal: No deformities, BUE and BLE strength normal and equal Skin: Warm and dry, no rashes   Neuro: Alert and oriented to  person, place, time, and situation, CNII-XII grossly intact, no focal deficits  Psych: Normal mood and affect   ASSESSMENT:   Tara Moreno is a 77 y.o. female who presents for the following: 1. LBBB (left bundle branch block)   2. Chronic systolic heart failure (HCC)   3. Primary hypertension   4. Pericardial effusion     PLAN:   1. LBBB (left bundle branch block) 2. Chronic systolic heart failure (HCC) 3. Primary hypertension 4. Pericardial effusion -Long history of a moderate pericardial effusion.  Unchanged on my review.  No symptoms of tamponade.  Ejection fraction is 45 to 50% and this is related to her left bundle branch block.  She has no symptoms of congestive heart failure.  I really see no need to treat this aggressively given her lack of symptoms.  This is basically subclinical LV dysfunction.  She has nonobstructive CAD.  Lipids are at goal.  She will see [place] yearly.  Left bundle branch block is not wide enough to merit pacing.      Disposition: Return in about 1 year (around 12/10/2023).  Medication Adjustments/Labs and Tests Ordered: Current medicines are reviewed at length with the patient today.  Concerns regarding medicines are outlined above.  Orders Placed This Encounter  Procedures   EKG 12-Lead   No orders of the defined types were placed in this  encounter.  Patient Instructions  Medication Instructions:  NO CHANGES *If you need a refill on your cardiac medications before your next appointment, please call your pharmacy*   Lab Work: NONE If you have labs (blood work) drawn today and your tests are completely normal, you will receive your results only by: MyChart Message (if you have MyChart) OR A paper copy in the mail If you have any lab test that is abnormal or we need to change your treatment, we will call you to review the results.   Testing/Procedures: NONE   Follow-Up: At [facility], you and your health needs are our priority.  As part of our continuing mission to provide you with exceptional heart care, we have created designated Provider Care Teams.  These Care Teams include your primary Cardiologist (physician) and Advanced Practice Providers (APPs -  Physician Assistants and Nurse Practitioners) who all work together to provide you with the care you need, when you need it.  We recommend signing up for the patient portal called "MyChart".  Sign up information is provided on this After Visit Summary.  MyChart is used to connect with patients for Virtual Visits (Telemedicine).  Patients are able to view lab/test results, encounter notes, upcoming appointments, etc.  Non-urgent messages can be sent to your provider as well.   To learn more about what you can do with MyChart, go to [email or web address].    Your next appointment:   1 year(s)  Provider:   DR Flora Lipps    Other Instructions NONE    Time Spent with Patient: I have spent a total of 25 minutes with patient reviewing hospital notes, telemetry, EKGs, labs and examining the patient as well as establishing an assessment and plan that was discussed with the patient.  > 50% of time was spent in direct patient care.  Signed, Lenna Gilford. Flora Lipps, MD, [facility]  [facility]  [address] [place], [place] [address] [phone]   12/10/2022 3:21 PM

## 2022-12-10 ENCOUNTER — Ambulatory Visit
Admission: RE | Admit: 2022-12-10 | Discharge: 2022-12-10 | Disposition: A | Discharge status: Home / Self Care | Payer: Medicare Other | Source: Ambulatory Visit | Attending: Emergency Medicine | Admitting: Emergency Medicine

## 2022-12-10 ENCOUNTER — Encounter: Payer: Self-pay | Admitting: Cardiovascular Disease

## 2022-12-10 ENCOUNTER — Ambulatory Visit: Payer: Medicare Other | Attending: Cardiovascular Disease | Admitting: Cardiovascular Disease

## 2022-12-10 ENCOUNTER — Other Ambulatory Visit: Payer: Self-pay | Admitting: Emergency Medicine

## 2022-12-10 VITALS — BP 115/69 | HR 80 | Ht 67.0 in | Wt 261.8 lb

## 2022-12-10 DIAGNOSIS — I3139 Other pericardial effusion (noninflammatory): Secondary | ICD-10-CM

## 2022-12-10 DIAGNOSIS — I5022 Chronic systolic (congestive) heart failure: Secondary | ICD-10-CM | POA: Diagnosis not present

## 2022-12-10 DIAGNOSIS — Z1231 Encounter for screening mammogram for malignant neoplasm of breast: Secondary | ICD-10-CM | POA: Diagnosis not present

## 2022-12-10 DIAGNOSIS — I447 Left bundle-branch block, unspecified: Secondary | ICD-10-CM

## 2022-12-10 DIAGNOSIS — I10 Essential (primary) hypertension: Secondary | ICD-10-CM

## 2022-12-10 NOTE — Patient Instructions (Addendum)
Medication Instructions:  NO CHANGES *If you need a refill on your cardiac medications before your next appointment, please call your pharmacy*   Lab Work: NONE If you have labs (blood work) drawn today and your tests are completely normal, you will receive your results only by: MyChart Message (if you have MyChart) OR A paper copy in the mail If you have any lab test that is abnormal or we need to change your treatment, we will call you to review the results.   Testing/Procedures: NONE   Follow-Up: At [facility], you and your health needs are our priority.  As part of our continuing mission to provide you with exceptional heart care, we have created designated Provider Care Teams.  These Care Teams include your primary Cardiologist (physician) and Advanced Practice Providers (APPs -  Physician Assistants and Nurse Practitioners) who all work together to provide you with the care you need, when you need it.  We recommend signing up for the patient portal called "MyChart".  Sign up information is provided on this After Visit Summary.  MyChart is used to connect with patients for Virtual Visits (Telemedicine).  Patients are able to view lab/test results, encounter notes, upcoming appointments, etc.  Non-urgent messages can be sent to your provider as well.   To learn more about what you can do with MyChart, go to [email or web address].    Your next appointment:   1 year(s)  Provider:   DR Flora Lipps    Other Instructions NONE

## 2023-03-02 ENCOUNTER — Ambulatory Visit: Payer: Medicare Other

## 2023-03-02 VITALS — Ht 67.0 in | Wt 261.0 lb

## 2023-03-02 DIAGNOSIS — Z0000 Encounter for general adult medical examination without abnormal findings: Secondary | ICD-10-CM | POA: Diagnosis not present

## 2023-03-02 NOTE — Patient Instructions (Signed)
Ms. Olp , Thank you for taking time to come for your Medicare Wellness Visit. I appreciate your ongoing commitment to your health goals. Please review the following plan we discussed and let me know if I can assist you in the future.   Referrals/Orders/Follow-Ups/Clinician Recommendations: No  This is a list of the screening recommended for you and due dates:  Health Maintenance  Topic Date Due   COVID-19 Vaccine (8 - 2023-24 season) 01/09/2023   Zoster (Shingles) Vaccine (1 of 2) 06/02/2023*   Medicare Annual Wellness Visit  03/01/2024   Colon Cancer Screening  05/13/2029   Pneumonia Vaccine  Completed   Flu Shot  Completed   DEXA scan (bone density measurement)  Completed   Hepatitis C Screening  Completed   HPV Vaccine  Aged Out   DTaP/Tdap/Td vaccine  Discontinued  *Topic was postponed. The date shown is not the original due date.    Advanced directives: (Declined) Advance directive discussed with you today. Even though you declined this today, please call our office should you change your mind, and we can give you the proper paperwork for you to fill out. Patient has a MOST form on file.  Next Medicare Annual Wellness Visit scheduled for next year: No

## 2023-03-02 NOTE — Progress Notes (Signed)
Subjective:   Tara Moreno is a 77 y.o. female who presents for an Initial Medicare Annual Wellness Visit.  Visit Complete: Virtual I connected with  Tara Moreno on 03/02/23 by a audio enabled telemedicine application and verified that I am speaking with the correct person using two identifiers.  Patient Location: Home  Provider Location: Office/Clinic  I discussed the limitations of evaluation and management by telemedicine. The patient expressed understanding and agreed to proceed.  Vital Signs: Because this visit was a virtual/telehealth visit, some criteria may be missing or patient reported. Any vitals not documented were not able to be obtained and vitals that have been documented are patient reported.  Patient Medicare AWV questionnaire was completed by the patient on 03/01/2023; I have confirmed that all information answered by patient is correct and no changes since this date.  Cardiac Risk Factors include: advanced age (>68men, >41 women)     Objective:    Today's Vitals   03/02/23 1533  Weight: 261 lb (118.4 kg)  Height: 5\' 7"  (1.702 m)  PainSc: 0-No pain   Body mass index is 40.88 kg/m.     03/02/2023    3:35 PM 02/13/2022    8:43 AM 12/29/2021   10:00 AM 07/01/2017    7:52 PM 07/01/2017    1:56 PM 06/28/2017    3:32 PM 12/30/2015    6:59 PM  Advanced Directives  Does Patient Have a Medical Advance Directive? No No No No No No No  Would patient like information on creating a medical advance directive? No - Patient declined No - Patient declined  No - Patient declined No - Patient declined No - Patient declined No - patient declined information    Current Medications (verified) Outpatient Encounter Medications as of 03/02/2023  Medication Sig   amLODipine-olmesartan (AZOR) 10-40 MG tablet Take 1 tablet by mouth daily.   atorvastatin (LIPITOR) 10 MG tablet Take 1 tablet (10 mg total) by mouth daily.   BIOTIN PO Take by mouth daily.   calcium carbonate  (OSCAL) 1500 (600 Ca) MG TABS tablet Take 600 mg of elemental calcium by mouth 2 (two) times daily with a meal.   FLAXSEED, LINSEED, PO Take by mouth daily.   MILK THISTLE EXTRACT PO Take 1 capsule by mouth daily.   Multiple Vitamin (MULTIVITAMIN WITH MINERALS) TABS tablet Take 1 tablet by mouth daily. ULTRA MEGA MULTIVITAMIN TIME RELEASE   Omega-3 Fatty Acids (SALMON OIL-1000 PO) Take 2,000 mg by mouth daily.   pantoprazole (PROTONIX) 40 MG tablet Take 40 mg by mouth daily.    Probiotic Product (PROBIOTIC PO) Take 1 capsule by mouth daily. ULTRA PROBIOTIC   Vitamin D, Ergocalciferol, (DRISDOL) 1.25 MG (50000 UNIT) CAPS capsule Take 50,000 Units by mouth every 7 (seven) days.   VITAMIN E PO Take by mouth.   Ascorbic Acid (VITAMIN C PO) Take by mouth.   Green Tea, Camillia sinensis, (GREEN TEA EXTRACT PO) Take 500 mg by mouth daily. (Patient not taking: Reported on 09/01/2022)   Lecithin 1200 MG CAPS Take 1,200 mg by mouth daily.   Pyridoxine HCl (VITAMIN B-6 PO) Take by mouth.   No facility-administered encounter medications on file as of 03/02/2023.    Allergies (verified) Patient has no known allergies.   History: Past Medical History:  Diagnosis Date   Arthritis    "thumb joints, was in my knees, in my back" (07/01/2017)   CHF (congestive heart failure) (HCC)    Frequent urination at night  GERD (gastroesophageal reflux disease)    Hyperlipemia    Hypertension    PONV (postoperative nausea and vomiting)    Past Surgical History:  Procedure Laterality Date   ABDOMINAL HYSTERECTOMY     "partial"   ADRENAL GLAND SURGERY Left 1990s   "tumor on my adrenal gland removed"   AUGMENTATION MAMMAPLASTY  2000   BUNIONECTOMY Left    CARDIAC CATHETERIZATION     no PCI; had before renal tumor was removed   COLONOSCOPY     EXCISION MASS UPPER EXTREMETIES Right 07/01/2017   Procedure: RIGHT SCAPULAR MASS EXCISION;  Surgeon: Beverely Low, MD;  Location: [facility] OR;  Service: Orthopedics;   Laterality: Right;   FINGER DEBRIDEMENT  02/2022   JOINT REPLACEMENT     REVERSE SHOULDER ARTHROPLASTY Right 03/08/2014   Procedure: RIGHT TOTAL SHOULDER ARTHROPLASTY VERSES A REVERSE ARTHROPLASTY;  Surgeon: Verlee Rossetti, MD;  Location: [facility] OR;  Service: Orthopedics;  Laterality: Right;   REVERSE SHOULDER ARTHROPLASTY Left 07/05/2014   Procedure: LEFT REVERSE SHOULDER ARTHROPLASTY;  Surgeon: Verlee Rossetti, MD;  Location: [facility] OR;  Service: Orthopedics;  Laterality: Left;   REVISION TOTAL SHOULDER ARTHROPLASTY Right 03/08/2014   dr Matilde Sprang MASS EXCISION Right 07/01/2017   TONSILLECTOMY     TOTAL KNEE ARTHROPLASTY Bilateral 10/09/2014   Procedure: BILATERAL TOTAL KNEE ARTHROPLASTY;  Surgeon: Ollen Gross, MD;  Location: [facility] ORS;  Service: Orthopedics;  Laterality: Bilateral;  with general anesthesia   TUBAL LIGATION     Family History  Problem Relation Age of Onset   Hypertension Mother    Hypertension Father    Hypertension Sister    Hypertension Brother    Hypertension Other    Colon cancer Neg Hx    Liver disease Neg Hx    Esophageal cancer Neg Hx    Social History   Socioeconomic History   Marital status: Married    Spouse name: Not on file   Number of children: 2   Years of education: Not on file   Highest education level: Associate degree: occupational, Scientist, product/process development, or vocational program  Occupational History   Occupation: Retired Insurance underwriter  Tobacco Use   Smoking status: Never   Smokeless tobacco: Never  Vaping Use   Vaping status: Never Used  Substance and Sexual Activity   Alcohol use: No   Drug use: No   Sexual activity: Yes  Other Topics Concern   Not on file  Social History Narrative   Not on file   Social Determinants of Health   Financial Resource Strain: Patient Declined (03/02/2023)   Overall Financial Resource Strain (CARDIA)    Difficulty of Paying Living Expenses: Patient declined  Food Insecurity: Patient Declined  (03/02/2023)   Hunger Vital Sign    Worried About Running Out of Food in the Last Year: Patient declined    Ran Out of Food in the Last Year: Patient declined  Transportation Needs: No Transportation Needs (03/02/2023)   PRAPARE - Administrator, Civil Service (Medical): No    Lack of Transportation (Non-Medical): No  Physical Activity: Inactive (03/02/2023)   Exercise Vital Sign    Days of Exercise per Week: 0 days    Minutes of Exercise per Session: 0 min  Stress: Stress Concern Present (03/02/2023)   [employer] of Occupational Health - Occupational Stress Questionnaire    Feeling of Stress : To some extent  Social Connections: Unknown (03/02/2023)   Social Connection and Isolation Panel [NHANES]  Frequency of Communication with Friends and Family: More than three times a week    Frequency of Social Gatherings with Friends and Family: Once a week    Attends Religious Services: Patient unable to answer    Active Member of Clubs or Organizations: Yes    Attends Engineer, structural: More than 4 times per year    Marital Status: Married    Tobacco Counseling Counseling given: Not Answered   Clinical Intake:  Pre-visit preparation completed: Yes  Pain : No/denies pain Pain Score: 0-No pain     BMI - recorded: 40.88 Nutritional Status: BMI > 30  Obese Nutritional Risks: None Diabetes: No  How often do you need to have someone help you when you read instructions, pamphlets, or other written materials from your doctor or pharmacy?: 1 - Never What is the last grade level you completed in school?: COLLEGE GRADUATE; RETIRED FROM POLICE DEPT.  Interpreter Needed?: No  Information entered by :: Avalina Benko N. Gilmer Kaminsky, LPN.   Activities of Daily Living    03/02/2023    3:39 PM 03/01/2023    4:46 PM  In your present state of health, do you have any difficulty performing the following activities:  Hearing? 0 0  Vision? 0 0  Difficulty  concentrating or making decisions? 0 0  Walking or climbing stairs? 0 0  Dressing or bathing? 0 0  Doing errands, shopping? 0 0  Preparing Food and eating ? N N  Using the Toilet? N N  In the past six months, have you accidently leaked urine? N N  Do you have problems with loss of bowel control? N N  Managing your Medications? N N  Managing your Finances? N N  Housekeeping or managing your Housekeeping? N N    Patient Care Team: Georgina Quint, MD as PCP - General (Internal Medicine) Manning Charity, OD as Referring Physician (Optometry)  Indicate any recent Medical Services you may have received from other than [facility] providers in the past year (date may be approximate).     Assessment:   This is a routine wellness examination for [place].  Hearing/Vision screen Hearing Screening - Comments:: Patient has hearing difficulty and wears hearing aids. Vision Screening - Comments:: Patient does wear corrective lenses/contacts.  Annual eye exam done by: Manning Charity, OD.     Goals Addressed             This Visit's Progress    My goal for 2025 is to start an exercise regimen.        Depression Screen    03/02/2023    3:37 PM 09/01/2022    3:29 PM 06/02/2022    3:11 PM  PHQ 2/9 Scores  PHQ - 2 Score 0 0 0  PHQ- 9 Score 0      Fall Risk    03/02/2023    3:37 PM 03/01/2023    4:46 PM 09/01/2022    3:28 PM 06/02/2022    3:11 PM  Fall Risk   Falls in the past year? 1 1 1 1   Number falls in past yr: 0 0 0 0  Injury with Fall? 1 1 0 0  Risk for fall due to :   No Fall Risks No Fall Risks  Follow up Falls evaluation completed;Education provided  Falls evaluation completed Falls evaluation completed    MEDICARE RISK AT HOME: Medicare Risk at Home Any stairs in or around the home?: No If so, are there any without handrails?: No  Home free of loose throw rugs in walkways, pet beds, electrical cords, etc?: Yes Adequate lighting in your home to reduce risk of falls?:  Yes Life alert?: No Use of a cane, walker or w/c?: No Grab bars in the bathroom?: Yes Shower chair or bench in shower?: No Elevated toilet seat or a handicapped toilet?: Yes  TIMED UP AND GO:  Was the test performed? No    Cognitive Function: Normal cognitive status assessed by direct observation via telephone conversation by this Nurse Health Advisor. No abnormalities found.    03/02/2023    3:46 PM  MMSE - Mini Mental State Exam  Not completed: Unable to complete        03/02/2023    3:47 PM  6CIT Screen  What Year? 0 points  What month? 0 points  What time? 0 points  Count back from 20 0 points  Months in reverse 0 points  Repeat phrase 0 points  Total Score 0 points    Immunizations Immunization History  Administered Date(s) Administered   Fluad Quad(high Dose 65+) 02/13/2022   Influenza-Unspecified 01/25/2023   PFIZER(Purple Top)SARS-COV-2 Vaccination 06/01/2019, 06/22/2019, 02/02/2020, 08/24/2020, 01/14/2021   PNEUMOCOCCAL CONJUGATE-20 02/13/2022   Pfizer Covid-19 Vaccine Bivalent Booster 44yrs & up 01/13/2021   Pfizer(Comirnaty)Fall Seasonal Vaccine 12 years and older 02/15/2022   Respiratory Syncytial Virus Vaccine,Recomb Aduvanted(Arexvy) 02/23/2022   Td 02/24/2023   Tdap 02/24/2023    TDAP status: Up to date  Flu Vaccine status: Up to date  Pneumococcal vaccine status: Up to date  Covid-19 vaccine status: Completed vaccines  Qualifies for Shingles Vaccine? Yes   Zostavax completed No   Shingrix Completed?: No.    Education has been provided regarding the importance of this vaccine. Patient has been advised to call insurance company to determine out of pocket expense if they have not yet received this vaccine. Advised may also receive vaccine at local pharmacy or Health Dept. Verbalized acceptance and understanding.  Screening Tests Health Maintenance  Topic Date Due   COVID-19 Vaccine (8 - 2023-24 season) 01/09/2023   Zoster Vaccines- Shingrix  (1 of 2) 06/02/2023 (Originally 11/[birth year]/1997)   Medicare Annual Wellness (AWV)  03/01/2024   Colonoscopy  05/13/2029   Pneumonia Vaccine 33+ Years old  Completed   INFLUENZA VACCINE  Completed   DEXA SCAN  Completed   Hepatitis C Screening  Completed   HPV VACCINES  Aged Out   DTaP/Tdap/Td  Discontinued    Health Maintenance  Health Maintenance Due  Topic Date Due   COVID-19 Vaccine (8 - 2023-24 season) 01/09/2023    Colorectal cancer screening: Type of screening: Colonoscopy. Completed 05/13/2022. Repeat every 7 years  Mammogram status: Completed 12/10/2022. Repeat every year  Bone Density status: Completed 04/21/2022. Results reflect: Bone density results: OSTEOPENIA. Repeat every 2-3 years.  Lung Cancer Screening: (Low Dose CT Chest recommended if Age 8-80 years, 20 pack-year currently smoking OR have quit w/in 15years.) does not qualify.   Lung Cancer Screening Referral: NO  Additional Screening:  Hepatitis C Screening: does qualify; Completed 09/01/2022  Vision Screening: Recommended annual ophthalmology exams for early detection of glaucoma and other disorders of the eye. Is the patient up to date with their annual eye exam?  Yes  Who is the provider or what is the name of the office in which the patient attends annual eye exams? Manning Charity, OD. If pt is not established with a provider, would they like to be referred to a provider to establish care? No .  Dental Screening: Recommended annual dental exams for proper oral hygiene  Diabetic Foot Exam: N/A  Community Resource Referral / Chronic Care Management: CRR required this visit?  No   CCM required this visit?  No     Plan:     I have personally reviewed and noted the following in the patient's chart:   Medical and social history Use of alcohol, tobacco or illicit drugs  Current medications and supplements including opioid prescriptions. Patient is not currently taking opioid prescriptions. Functional  ability and status Nutritional status Physical activity Advanced directives List of other physicians Hospitalizations, surgeries, and ER visits in previous 12 months Vitals Screenings to include cognitive, depression, and falls Referrals and appointments  In addition, I have reviewed and discussed with patient certain preventive protocols, quality metrics, and best practice recommendations. A written personalized care plan for preventive services as well as general preventive health recommendations were provided to patient.     Mickeal Needy, LPN   [id number]/29/[id number]   After Visit Summary: (MyChart) Due to this being a telephonic visit, the after visit summary with patients personalized plan was offered to patient via MyChart   Nurse Notes: N/A

## 2023-03-03 ENCOUNTER — Encounter: Payer: Self-pay | Admitting: Emergency Medicine

## 2023-03-03 ENCOUNTER — Ambulatory Visit: Payer: Medicare Other | Admitting: Emergency Medicine

## 2023-03-03 VITALS — BP 118/78 | HR 75 | Temp 97.8°F | Ht 67.0 in | Wt 262.4 lb

## 2023-03-03 DIAGNOSIS — E785 Hyperlipidemia, unspecified: Secondary | ICD-10-CM | POA: Diagnosis not present

## 2023-03-03 DIAGNOSIS — K219 Gastro-esophageal reflux disease without esophagitis: Secondary | ICD-10-CM | POA: Diagnosis not present

## 2023-03-03 DIAGNOSIS — I10 Essential (primary) hypertension: Secondary | ICD-10-CM

## 2023-03-03 DIAGNOSIS — J398 Other specified diseases of upper respiratory tract: Secondary | ICD-10-CM

## 2023-03-03 MED ORDER — PANTOPRAZOLE SODIUM 40 MG PO TBEC: 40.0000 mg | DELAYED_RELEASE_TABLET | Freq: Every day | ORAL | 3 refills | Status: DC

## 2023-03-03 MED ORDER — AMLODIPINE-OLMESARTAN 10-40 MG PO TABS: 1.0000 | ORAL_TABLET | Freq: Every day | ORAL | 3 refills | Status: DC

## 2023-03-03 NOTE — Assessment & Plan Note (Signed)
BP Readings from Last 3 Encounters:  03/03/23 118/78  12/10/22 115/69  09/01/22 128/82  Well-controlled hypertension Continue amlodipine-olmesartan 10-40 mg daily Cardiovascular risks associated with hypertension discussed Dietary approaches to stop hypertension discussed

## 2023-03-03 NOTE — Patient Instructions (Signed)
Health Maintenance After Age 75 After age 75, you are at a higher risk for certain long-term diseases and infections as well as injuries from falls. Falls are a major cause of broken bones and head injuries in people who are older than age 75. Getting regular preventive care can help to keep you healthy and well. Preventive care includes getting regular testing and making lifestyle changes as recommended by your health care provider. Talk with your health care provider about: Which screenings and tests you should have. A screening is a test that checks for a disease when you have no symptoms. A diet and exercise plan that is right for you. What should I know about screenings and tests to prevent falls? Screening and testing are the best ways to find a health problem early. Early diagnosis and treatment give you the best chance of managing medical conditions that are common after age 75. Certain conditions and lifestyle choices may make you more likely to have a fall. Your health care provider may recommend: Regular vision checks. Poor vision and conditions such as cataracts can make you more likely to have a fall. If you wear glasses, make sure to get your prescription updated if your vision changes. Medicine review. Work with your health care provider to regularly review all of the medicines you are taking, including over-the-counter medicines. Ask your health care provider about any side effects that may make you more likely to have a fall. Tell your health care provider if any medicines that you take make you feel dizzy or sleepy. Strength and balance checks. Your health care provider may recommend certain tests to check your strength and balance while standing, walking, or changing positions. Foot health exam. Foot pain and numbness, as well as not wearing proper footwear, can make you more likely to have a fall. Screenings, including: Osteoporosis screening. Osteoporosis is a condition that causes  the bones to get weaker and break more easily. Blood pressure screening. Blood pressure changes and medicines to control blood pressure can make you feel dizzy. Depression screening. You may be more likely to have a fall if you have a fear of falling, feel depressed, or feel unable to do activities that you used to do. Alcohol use screening. Using too much alcohol can affect your balance and may make you more likely to have a fall. Follow these instructions at home: Lifestyle Do not drink alcohol if: Your health care provider tells you not to drink. If you drink alcohol: Limit how much you have to: 0-1 drink a day for women. 0-2 drinks a day for men. Know how much alcohol is in your drink. In the [place] one drink equals one 12 oz bottle of beer (355 mL), one 5 oz glass of wine (148 mL), or one 1 oz glass of hard liquor (44 mL). Do not use any products that contain nicotine or tobacco. These products include cigarettes, chewing tobacco, and vaping devices, such as e-cigarettes. If you need help quitting, ask your health care provider. Activity  Follow a regular exercise program to stay fit. This will help you maintain your balance. Ask your health care provider what types of exercise are appropriate for you. If you need a cane or walker, use it as recommended by your health care provider. Wear supportive shoes that have nonskid soles. Safety  Remove any tripping hazards, such as rugs, cords, and clutter. Install safety equipment such as grab bars in bathrooms and safety rails on stairs. Keep rooms and walkways   well-lit. General instructions Talk with your health care provider about your risks for falling. Tell your health care provider if: You fall. Be sure to tell your health care provider about all falls, even ones that seem minor. You feel dizzy, tiredness (fatigue), or off-balance. Take over-the-counter and prescription medicines only as told by your health care provider. These include  supplements. Eat a healthy diet and maintain a healthy weight. A healthy diet includes low-fat dairy products, low-fat (lean) meats, and fiber from whole grains, beans, and lots of fruits and vegetables. Stay current with your vaccines. Schedule regular health, dental, and eye exams. Summary Having a healthy lifestyle and getting preventive care can help to protect your health and wellness after age 75. Screening and testing are the best way to find a health problem early and help you avoid having a fall. Early diagnosis and treatment give you the best chance for managing medical conditions that are more common for people who are older than age 75. Falls are a major cause of broken bones and head injuries in people who are older than age 75. Take precautions to prevent a fall at home. Work with your health care provider to learn what changes you can make to improve your health and wellness and to prevent falls. This information is not intended to replace advice given to you by your health care provider. Make sure you discuss any questions you have with your health care provider. Document Revised: 09/15/2020 Document Reviewed: 09/15/2020 Elsevier Patient Education  2024 [employer]  

## 2023-03-03 NOTE — Assessment & Plan Note (Signed)
Chronic stable condition Diet and nutrition discussed Recommend to decrease amount of daily carbohydrate intake and daily calories and increase amount of plant-based protein in her diet Continue atorvastatin 10 mg daily

## 2023-03-03 NOTE — Assessment & Plan Note (Signed)
Contributing to nighttime symptoms Continue pantoprazole 40 mg daily

## 2023-03-03 NOTE — Assessment & Plan Note (Signed)
Chronic stable condition May be triggering nighttime symptoms Recommend pulmonary evaluation Referral placed today

## 2023-03-03 NOTE — Progress Notes (Signed)
Tara Moreno 77 y.o.   Chief Complaint  Patient presents with   Medical Management of Chronic Issues    f/u appt, no concerns     HISTORY OF PRESENT ILLNESS: This is a 77 y.o. female here for 43-month follow-up of chronic medical conditions including hypertension and dyslipidemia. Also concerned about CT scanning of chest finding from 1 year ago. CT scan report reviewed.  It showed pericardial effusion and also tracheobronchomalacia. Has intermittent wheezing and cough mostly at nighttime No other complaint or medical concerns today.  HPI   Prior to Admission medications   Medication Sig Start Date End Date Taking? Authorizing Provider  amLODipine-olmesartan (AZOR) 10-40 MG tablet Take 1 tablet by mouth daily.   Yes [provider]  atorvastatin (LIPITOR) 10 MG tablet Take 1 tablet (10 mg total) by mouth daily. 09/10/22  Yes Alizandra Loh, Eilleen Kempf, MD  BIOTIN PO Take by mouth daily.   Yes [provider]  calcium carbonate (OSCAL) 1500 (600 Ca) MG TABS tablet Take 600 mg of elemental calcium by mouth 2 (two) times daily with a meal.   Yes [provider]  FLAXSEED, LINSEED, PO Take by mouth daily.   Yes [provider]  MILK THISTLE EXTRACT PO Take 1 capsule by mouth daily.   Yes [provider]  Multiple Vitamin (MULTIVITAMIN WITH MINERALS) TABS tablet Take 1 tablet by mouth daily. ULTRA MEGA MULTIVITAMIN TIME RELEASE   Yes [provider]  Omega-3 Fatty Acids (SALMON OIL-1000 PO) Take 2,000 mg by mouth daily.   Yes [provider]  pantoprazole (PROTONIX) 40 MG tablet Take 40 mg by mouth daily.    Yes [provider]  Probiotic Product (PROBIOTIC PO) Take 1 capsule by mouth daily. ULTRA PROBIOTIC   Yes [provider]  Vitamin D, Ergocalciferol, (DRISDOL) 1.25 MG (50000 UNIT) CAPS capsule Take 50,000 Units by mouth every 7 (seven) days.   Yes [provider]  VITAMIN E PO Take by mouth.    Yes [provider]    No Known Allergies  Patient Active Problem List   Diagnosis Date Noted   Essential hypertension 06/02/2022   Dyslipidemia 06/02/2022   Gastroesophageal reflux disease without esophagitis 06/02/2022   History of excision of mass 07/01/2017   OA (osteoarthritis) of knee 10/09/2014   S/P shoulder replacement 07/05/2014   Arthritis of shoulder region, right, degenerative 03/08/2014    Past Medical History:  Diagnosis Date   Arthritis    "thumb joints, was in my knees, in my back" (07/01/2017)   CHF (congestive heart failure) (HCC)    Frequent urination at night    GERD (gastroesophageal reflux disease)    Hyperlipemia    Hypertension    PONV (postoperative nausea and vomiting)     Past Surgical History:  Procedure Laterality Date   ABDOMINAL HYSTERECTOMY     "partial"   ADRENAL GLAND SURGERY Left 1990s   "tumor on my adrenal gland removed"   AUGMENTATION MAMMAPLASTY  2000   BUNIONECTOMY Left    CARDIAC CATHETERIZATION     no PCI; had before renal tumor was removed   COLONOSCOPY     EXCISION MASS UPPER EXTREMETIES Right 07/01/2017   Procedure: RIGHT SCAPULAR MASS EXCISION;  Surgeon: Beverely Low, MD;  Location: [facility] OR;  Service: Orthopedics;  Laterality: Right;   FINGER DEBRIDEMENT  02/2022   JOINT REPLACEMENT     REVERSE SHOULDER ARTHROPLASTY Right 03/08/2014   Procedure: RIGHT TOTAL SHOULDER ARTHROPLASTY VERSES A REVERSE  ARTHROPLASTY;  Surgeon: Verlee Rossetti, MD;  Location: [facility] OR;  Service: Orthopedics;  Laterality: Right;   REVERSE SHOULDER ARTHROPLASTY Left 07/05/2014   Procedure: LEFT REVERSE SHOULDER ARTHROPLASTY;  Surgeon: Verlee Rossetti, MD;  Location: [facility] OR;  Service: Orthopedics;  Laterality: Left;   REVISION TOTAL SHOULDER ARTHROPLASTY Right 03/08/2014   dr Matilde Sprang MASS EXCISION Right 07/01/2017   TONSILLECTOMY     TOTAL KNEE ARTHROPLASTY Bilateral 10/09/2014   Procedure: BILATERAL TOTAL KNEE ARTHROPLASTY;   Surgeon: Ollen Gross, MD;  Location: [facility] ORS;  Service: Orthopedics;  Laterality: Bilateral;  with general anesthesia   TUBAL LIGATION      Social History   Socioeconomic History   Marital status: Married    Spouse name: Not on file   Number of children: 2   Years of education: Not on file   Highest education level: Associate degree: occupational, Scientist, product/process development, or vocational program  Occupational History   Occupation: Retired Insurance underwriter  Tobacco Use   Smoking status: Never   Smokeless tobacco: Never  Vaping Use   Vaping status: Never Used  Substance and Sexual Activity   Alcohol use: No   Drug use: No   Sexual activity: Yes  Other Topics Concern   Not on file  Social History Narrative   Not on file   Social Determinants of Health   Financial Resource Strain: Patient Declined (03/02/2023)   Overall Financial Resource Strain (CARDIA)    Difficulty of Paying Living Expenses: Patient declined  Food Insecurity: Patient Declined (03/02/2023)   Hunger Vital Sign    Worried About Running Out of Food in the Last Year: Patient declined    Ran Out of Food in the Last Year: Patient declined  Transportation Needs: No Transportation Needs (03/02/2023)   PRAPARE - Administrator, Civil Service (Medical): No    Lack of Transportation (Non-Medical): No  Physical Activity: Inactive (03/02/2023)   Exercise Vital Sign    Days of Exercise per Week: 0 days    Minutes of Exercise per Session: 0 min  Stress: Stress Concern Present (03/02/2023)   [employer] of Occupational Health - Occupational Stress Questionnaire    Feeling of Stress : To some extent  Social Connections: Unknown (03/02/2023)   Social Connection and Isolation Panel [NHANES]    Frequency of Communication with Friends and Family: More than three times a week    Frequency of Social Gatherings with Friends and Family: Once a week    Attends Religious Services: Patient unable to answer    Active  Member of Clubs or Organizations: Yes    Attends Banker Meetings: More than 4 times per year    Marital Status: Married  Catering manager Violence: Patient Unable To Answer (03/02/2023)   Humiliation, Afraid, Rape, and Kick questionnaire    Fear of Current or Ex-Partner: Patient unable to answer    Emotionally Abused: Patient unable to answer    Physically Abused: Patient unable to answer    Sexually Abused: Patient unable to answer    Family History  Problem Relation Age of Onset   Hypertension Mother    Hypertension Father    Hypertension Sister    Hypertension Brother    Hypertension Other    Colon cancer Neg Hx    Liver disease Neg Hx    Esophageal cancer Neg Hx      Review of Systems  Constitutional: Negative.  Negative for chills and fever.  HENT: Negative.  Negative for congestion and sore throat.   Respiratory: Negative.  Negative for cough and shortness of breath.   Cardiovascular: Negative.  Negative for chest pain and palpitations.  Gastrointestinal:  Negative for abdominal pain, diarrhea, nausea and vomiting.  Genitourinary: Negative.  Negative for dysuria and hematuria.  Skin: Negative.  Negative for rash.  Neurological: Negative.  Negative for dizziness and headaches.  All other systems reviewed and are negative.   Today's Vitals   03/03/23 1528  BP: 118/78  Pulse: 75  Temp: 97.8 F (36.6 C)  TempSrc: Oral  SpO2: 96%  Weight: 262 lb 6 oz (119 kg)  Height: 5\' 7"  (1.702 m)   Body mass index is 41.09 kg/m.   Physical Exam Vitals reviewed.  Constitutional:      Appearance: Normal appearance. She is obese.  HENT:     Head: Normocephalic.  Eyes:     Extraocular Movements: Extraocular movements intact.  Cardiovascular:     Rate and Rhythm: Normal rate and regular rhythm.     Pulses: Normal pulses.     Heart sounds: Normal heart sounds.  Pulmonary:     Effort: Pulmonary effort is normal.     Breath sounds: Normal breath sounds.   Skin:    General: Skin is warm and dry.  Neurological:     Mental Status: She is alert and oriented to person, place, and time.  Psychiatric:        Mood and Affect: Mood normal.        Behavior: Behavior normal.      ASSESSMENT & PLAN: A total of 43 minutes was spent with the patient and counseling/coordination of care regarding preparing for the visit, review of most recent office visit notes, review of multiple chronic medical conditions under management, review of most recent chest CT scan and echocardiogram reports, review of all medications, cardiovascular risks associated with hypertension and dyslipidemia, education on nutrition, prognosis, documentation, and need for follow-up.  Problem List Items Addressed This Visit       Cardiovascular and Mediastinum   Essential hypertension - Primary    BP Readings from Last 3 Encounters:  03/03/23 118/78  12/10/22 115/69  09/01/22 128/82  Well-controlled hypertension Continue amlodipine-olmesartan 10-40 mg daily Cardiovascular risks associated with hypertension discussed Dietary approaches to stop hypertension discussed       Relevant Medications   amLODipine-olmesartan (AZOR) 10-40 MG tablet     Respiratory   Tracheobronchomalacia    Chronic stable condition May be triggering nighttime symptoms Recommend pulmonary evaluation Referral placed today      Relevant Orders   Ambulatory referral to Pulmonology     Digestive   Gastroesophageal reflux disease without esophagitis    Contributing to nighttime symptoms Continue pantoprazole 40 mg daily      Relevant Medications   pantoprazole (PROTONIX) 40 MG tablet     Other   Dyslipidemia    Chronic stable condition Diet and nutrition discussed Recommend to decrease amount of daily carbohydrate intake and daily calories and increase amount of plant-based protein in her diet Continue atorvastatin 10 mg daily      Patient Instructions  Health Maintenance After Age  34 After age 75, you are at a higher risk for certain long-term diseases and infections as well as injuries from falls. Falls are a major cause of broken bones and head injuries in people who are older than age 28. Getting regular preventive care can help to keep you healthy and well. Preventive care includes getting regular  testing and making lifestyle changes as recommended by your health care provider. Talk with your health care provider about: Which screenings and tests you should have. A screening is a test that checks for a disease when you have no symptoms. A diet and exercise plan that is right for you. What should I know about screenings and tests to prevent falls? Screening and testing are the best ways to find a health problem early. Early diagnosis and treatment give you the best chance of managing medical conditions that are common after age 63. Certain conditions and lifestyle choices may make you more likely to have a fall. Your health care provider may recommend: Regular vision checks. Poor vision and conditions such as cataracts can make you more likely to have a fall. If you wear glasses, make sure to get your prescription updated if your vision changes. Medicine review. Work with your health care provider to regularly review all of the medicines you are taking, including over-the-counter medicines. Ask your health care provider about any side effects that may make you more likely to have a fall. Tell your health care provider if any medicines that you take make you feel dizzy or sleepy. Strength and balance checks. Your health care provider may recommend certain tests to check your strength and balance while standing, walking, or changing positions. Foot health exam. Foot pain and numbness, as well as not wearing proper footwear, can make you more likely to have a fall. Screenings, including: Osteoporosis screening. Osteoporosis is a condition that causes the bones to get weaker and  break more easily. Blood pressure screening. Blood pressure changes and medicines to control blood pressure can make you feel dizzy. Depression screening. You may be more likely to have a fall if you have a fear of falling, feel depressed, or feel unable to do activities that you used to do. Alcohol use screening. Using too much alcohol can affect your balance and may make you more likely to have a fall. Follow these instructions at home: Lifestyle Do not drink alcohol if: Your health care provider tells you not to drink. If you drink alcohol: Limit how much you have to: 0-1 drink a day for women. 0-2 drinks a day for men. Know how much alcohol is in your drink. In the [place] one drink equals one 12 oz bottle of beer (355 mL), one 5 oz glass of wine (148 mL), or one 1 oz glass of hard liquor (44 mL). Do not use any products that contain nicotine or tobacco. These products include cigarettes, chewing tobacco, and vaping devices, such as e-cigarettes. If you need help quitting, ask your health care provider. Activity  Follow a regular exercise program to stay fit. This will help you maintain your balance. Ask your health care provider what types of exercise are appropriate for you. If you need a cane or walker, use it as recommended by your health care provider. Wear supportive shoes that have nonskid soles. Safety  Remove any tripping hazards, such as rugs, cords, and clutter. Install safety equipment such as grab bars in bathrooms and safety rails on stairs. Keep rooms and walkways well-lit. General instructions Talk with your health care provider about your risks for falling. Tell your health care provider if: You fall. Be sure to tell your health care provider about all falls, even ones that seem minor. You feel dizzy, tiredness (fatigue), or off-balance. Take over-the-counter and prescription medicines only as told by your health care provider. These include supplements. Eat  a healthy  diet and maintain a healthy weight. A healthy diet includes low-fat dairy products, low-fat (lean) meats, and fiber from whole grains, beans, and lots of fruits and vegetables. Stay current with your vaccines. Schedule regular health, dental, and eye exams. Summary Having a healthy lifestyle and getting preventive care can help to protect your health and wellness after age 56. Screening and testing are the best way to find a health problem early and help you avoid having a fall. Early diagnosis and treatment give you the best chance for managing medical conditions that are more common for people who are older than age 50. Falls are a major cause of broken bones and head injuries in people who are older than age 43. Take precautions to prevent a fall at home. Work with your health care provider to learn what changes you can make to improve your health and wellness and to prevent falls. This information is not intended to replace advice given to you by your health care provider. Make sure you discuss any questions you have with your health care provider. Document Revised: 09/15/2020 Document Reviewed: 09/15/2020 Elsevier Patient Education  2024 Elsevier Inc.        Edwina Barth, MD  Primary Care at [facility]

## 2023-03-09 ENCOUNTER — Telehealth: Payer: Self-pay | Admitting: Emergency Medicine

## 2023-03-09 NOTE — Telephone Encounter (Signed)
As per patient wishes I am okay with this.

## 2023-03-09 NOTE — Telephone Encounter (Signed)
Pt called wanting to switch Doctors pt stated Dr. Alvy Bimler is a great Dr and the only reason she want to change because she is getting older and she would like to have a women Doctor. Pt would like to be switched to Dr. Okey Dupre.

## 2023-03-14 NOTE — Telephone Encounter (Signed)
Ok, would keep care with PCP until TOC apt

## 2023-05-07 ENCOUNTER — Other Ambulatory Visit: Payer: Self-pay | Admitting: Emergency Medicine

## 2023-05-16 ENCOUNTER — Other Ambulatory Visit: Payer: Self-pay | Admitting: Emergency Medicine

## 2023-08-21 ENCOUNTER — Emergency Department (HOSPITAL_BASED_OUTPATIENT_CLINIC_OR_DEPARTMENT_OTHER)
Admission: EM | Admit: 2023-08-21 | Discharge: 2023-08-21 | Disposition: A | Discharge status: Home / Self Care | Attending: Emergency Medicine | Admitting: Emergency Medicine

## 2023-08-21 ENCOUNTER — Emergency Department (HOSPITAL_BASED_OUTPATIENT_CLINIC_OR_DEPARTMENT_OTHER): Admitting: Radiology

## 2023-08-21 ENCOUNTER — Encounter (HOSPITAL_BASED_OUTPATIENT_CLINIC_OR_DEPARTMENT_OTHER): Payer: Self-pay

## 2023-08-21 ENCOUNTER — Emergency Department (HOSPITAL_BASED_OUTPATIENT_CLINIC_OR_DEPARTMENT_OTHER)

## 2023-08-21 ENCOUNTER — Other Ambulatory Visit: Payer: Self-pay

## 2023-08-21 DIAGNOSIS — M25511 Pain in right shoulder: Secondary | ICD-10-CM | POA: Insufficient documentation

## 2023-08-21 DIAGNOSIS — W01198A Fall on same level from slipping, tripping and stumbling with subsequent striking against other object, initial encounter: Secondary | ICD-10-CM | POA: Diagnosis not present

## 2023-08-21 DIAGNOSIS — R519 Headache, unspecified: Secondary | ICD-10-CM | POA: Diagnosis not present

## 2023-08-21 DIAGNOSIS — S0990XA Unspecified injury of head, initial encounter: Secondary | ICD-10-CM | POA: Diagnosis not present

## 2023-08-21 DIAGNOSIS — M25512 Pain in left shoulder: Secondary | ICD-10-CM | POA: Insufficient documentation

## 2023-08-21 DIAGNOSIS — W19XXXA Unspecified fall, initial encounter: Secondary | ICD-10-CM

## 2023-08-21 DIAGNOSIS — M533 Sacrococcygeal disorders, not elsewhere classified: Secondary | ICD-10-CM | POA: Diagnosis not present

## 2023-08-21 DIAGNOSIS — S300XXA Contusion of lower back and pelvis, initial encounter: Secondary | ICD-10-CM | POA: Diagnosis not present

## 2023-08-21 DIAGNOSIS — M542 Cervicalgia: Secondary | ICD-10-CM | POA: Diagnosis not present

## 2023-08-21 DIAGNOSIS — S3992XA Unspecified injury of lower back, initial encounter: Secondary | ICD-10-CM | POA: Diagnosis not present

## 2023-08-21 DIAGNOSIS — I6782 Cerebral ischemia: Secondary | ICD-10-CM | POA: Diagnosis not present

## 2023-08-21 DIAGNOSIS — S199XXA Unspecified injury of neck, initial encounter: Secondary | ICD-10-CM | POA: Diagnosis not present

## 2023-08-21 NOTE — ED Triage Notes (Signed)
 She states she fell backward while descending a stepladder 2 days ago. She states she struck her head, and landed on her buttocks. She c/o persistent h/a, mild nausea and coccyx area pain. She denies l.o.c. and is ambulatory and in no distress.

## 2023-08-21 NOTE — ED Provider Notes (Signed)
 [place] EMERGENCY DEPARTMENT AT [facility] Provider Note   CSN: [id number] Arrival date & time: 08/21/23  1438     History  Chief Complaint  Patient presents with   [name]    Tara Moreno is a 78 y.o. female.  Patient presents to the emergency department today for evaluation of injuries sustained after a fall 2 nights ago.  Patient states that she was on a stepstool and stepped backwards to the ground but lost her balance and fell backwards onto her back.  She struck the back of her head.  She had pain in her back, especially in the lower back and tailbone area.  Family could not help her get up off of the floor, so EMS was called for assistance.  Patient today had a headache and some nausea.  This prompted ED visit.  She continues to have pain in her tailbone area that is worse when she moves and walks.  She has pain in the base of her neck and bilateral shoulders.  No vomiting.  No vision change.  No confusion.      Home Medications Prior to Admission medications   Medication Sig Start Date End Date Taking? Authorizing Provider  amLODipine-olmesartan (AZOR) 10-40 MG tablet Take 1 tablet by mouth daily. 03/03/23   Elvira Hammersmith, MD  atorvastatin (LIPITOR) 10 MG tablet TAKE 1 TABLET BY MOUTH DAILY 05/17/23   Sagardia, Miguel Jose, MD  BIOTIN PO Take by mouth daily.    [provider]  calcium carbonate (OSCAL) 1500 (600 Ca) MG TABS tablet Take 600 mg of elemental calcium by mouth 2 (two) times daily with a meal.    [provider]  FLAXSEED, LINSEED, PO Take by mouth daily.    [provider]  MILK THISTLE EXTRACT PO Take 1 capsule by mouth daily.    [provider]  Multiple Vitamin (MULTIVITAMIN WITH MINERALS) TABS tablet Take 1 tablet by mouth daily. ULTRA MEGA MULTIVITAMIN TIME RELEASE    [provider]  Omega-3 Fatty Acids (SALMON OIL-1000 PO) Take 2,000 mg by mouth daily.    [provider]   pantoprazole (PROTONIX) 40 MG tablet TAKE 1 TABLET BY MOUTH DAILY 05/08/23   Elvira Hammersmith, MD  Probiotic Product (PROBIOTIC PO) Take 1 capsule by mouth daily. ULTRA PROBIOTIC    [provider]  Vitamin D, Ergocalciferol, (DRISDOL) 1.25 MG (50000 UNIT) CAPS capsule Take 50,000 Units by mouth every 7 (seven) days.    [provider]  VITAMIN E PO Take by mouth.    [provider]      Allergies    Patient has no known allergies.    Review of Systems   Review of Systems  Physical Exam Updated Vital Signs BP 125/75 (BP Location: Right Arm)   Pulse 73   Resp 18   Ht 5\' 6"  (1.676 m)   Wt 117.9 kg   SpO2 98%   BMI 41.97 kg/m  Physical Exam Vitals and nursing note reviewed.  Constitutional:      Appearance: She is well-developed.  HENT:     Head: Normocephalic and atraumatic. No raccoon eyes or Battle's sign.     Right Ear: Tympanic membrane, ear canal and external ear normal. No hemotympanum.     Left Ear: Tympanic membrane, ear canal and external ear normal. No hemotympanum.     Nose: Nose normal.     Mouth/Throat:     Pharynx: Uvula midline.  Eyes:  General: Lids are normal.     Extraocular Movements:     Right eye: No nystagmus.     Left eye: No nystagmus.     Conjunctiva/sclera: Conjunctivae normal.     Pupils: Pupils are equal, round, and reactive to light.     Comments: No visible hyphema noted  Cardiovascular:     Rate and Rhythm: Normal rate and regular rhythm.  Pulmonary:     Effort: Pulmonary effort is normal.     Breath sounds: Normal breath sounds.  Abdominal:     Palpations: Abdomen is soft.     Tenderness: There is no abdominal tenderness.  Musculoskeletal:     Cervical back: Normal range of motion and neck supple. Tenderness present. No bony tenderness.     Thoracic back: Tenderness present. No bony tenderness.     Lumbar back: No tenderness or bony tenderness.       Back:  Skin:    General: Skin is warm and  dry.  Neurological:     Mental Status: She is alert and oriented to person, place, and time.     GCS: GCS eye subscore is 4. GCS verbal subscore is 5. GCS motor subscore is 6.     Cranial Nerves: No cranial nerve deficit.     Sensory: No sensory deficit.     Coordination: Coordination normal.    ED Results / Procedures / Treatments   Labs (all labs ordered are listed, but only abnormal results are displayed) Labs Reviewed - No data to display  EKG None  Radiology No results found.  Procedures Procedures    Medications Ordered in ED Medications - No data to display  ED Course/ Medical Decision Making/ A&P    Patient seen and examined. History obtained directly from patient.   Labs/EKG: None ordered  Imaging: Ordered CT head and cervical spine, plain film x-ray of the pelvis and sacrum/coccyx.  Medications/Fluids: None ordered  Most recent vital signs reviewed and are as follows: BP 125/75 (BP Location: Right Arm)   Pulse 73   Resp 18   Ht 5\' 6"  (1.676 m)   Wt 117.9 kg   SpO2 98%   BMI 41.97 kg/m   Initial impression: Mechanical fall, did hit her head and now with headache and some nausea without vomiting today.  Imaging pending.  She looks well.  Fully oriented.  Normal neuro exam.  4:23 PM Reassessment performed. Patient appears stable, no decompensation during ED stay.  Imaging personally visualized and interpreted including: CT head and cervical spine agree negative; x-ray of the pelvis and sacrum, no sign of fracture  Reviewed pertinent lab work and imaging with patient at bedside. Questions answered.   Most current vital signs reviewed and are as follows: BP 119/70   Pulse 68   Temp 97.8 F (36.6 C)   Resp 18   Ht 5\' 6"  (1.676 m)   Wt 117.9 kg   SpO2 93%   BMI 41.97 kg/m   Plan: Discharge to home.   Prescriptions written for: None, patient declines prescription for muscle relaxer.  Other home care instructions discussed: Ice and heat, rest,  OTC meds  ED return instructions discussed: New or worsening symptoms, vomiting, confusion, severe headache  Follow-up instructions discussed: Patient encouraged to follow-up with their PCP in 7 days for reassessment if not improving.  Medical Decision Making Amount and/or Complexity of Data Reviewed Radiology: ordered.   Patient with mechanical fall 2 days ago onto her back.  She did hit her head.  Minor headache and nausea today.  Head CT and cervical spine CT are negative.  She also has pain in her tailbone that is worse with certain movements.  X-ray of the pelvis and sacrum is negative.  Supportive care indicated at this point.        Final Clinical Impression(s) / ED Diagnoses Final diagnoses:  Coccygeal contusion, initial encounter  Fall, initial encounter  Acute nonintractable headache, unspecified headache type    Rx / DC Orders ED Discharge Orders     None         Lyna Sandhoff, PA-C 08/21/23 1624    Merdis Stalling, MD 08/21/23 1654

## 2023-08-21 NOTE — Discharge Instructions (Signed)
 Please read and follow all provided instructions.  Your diagnoses today include:  1. Coccygeal contusion, initial encounter   2. Fall, initial encounter   3. Acute nonintractable headache, unspecified headache type     Tests performed today include: CT scan of your head and cervical spine that did not show any serious injury. X-ray of the pelvis and tailbone: No signs of fracture Vital signs. See below for your results today.   Medications prescribed:  Please use over-the-counter NSAID medications (ibuprofen, naproxen) or Tylenol (acetaminophen) as directed on the packaging for pain -- as long as you do not have any reasons avoid these medications. Reasons to avoid NSAID medications include: weak kidneys, a history of bleeding in your stomach or gut, or uncontrolled high blood pressure or previous heart attack. Reasons to avoid Tylenol include: liver problems or ongoing alcohol use. Never take more than 4000mg  or 8 Extra strength Tylenol in a 24 hour period.     Take any prescribed medications only as directed.  Home care instructions:  Follow any educational materials contained in this packet.  BE VERY CAREFUL not to take multiple medicines containing Tylenol (also called acetaminophen). Doing so can lead to an overdose which can damage your liver and cause liver failure and possibly death.   Follow-up instructions: Please follow-up with your primary care provider in the next 7 days for further evaluation of your symptoms.   Return instructions:  SEEK IMMEDIATE MEDICAL ATTENTION IF: There is confusion or drowsiness (although children frequently become drowsy after injury).  You cannot awaken the injured person.  You have more than one episode of vomiting.  You notice dizziness or unsteadiness which is getting worse, or inability to walk.  You have convulsions or unconsciousness.  You experience severe, persistent headaches not relieved by Tylenol. You cannot use arms or legs  normally.  There are changes in pupil sizes. (This is the black center in the colored part of the eye)  There is clear or bloody discharge from the nose or ears.  You have change in speech, vision, swallowing, or understanding.  Localized weakness, numbness, tingling, or change in bowel or bladder control. You have any other emergent concerns.  Additional Information: You have had a head injury which does not appear to require admission at this time.  Your vital signs today were: BP 119/70   Pulse 68   Temp 97.8 F (36.6 C)   Resp 18   Ht 5\' 6"  (1.676 m)   Wt 117.9 kg   SpO2 93%   BMI 41.97 kg/m  If your blood pressure (BP) was elevated above 135/85 this visit, please have this repeated by your doctor within one month. --------------

## 2023-09-06 ENCOUNTER — Encounter: Payer: Medicare Other | Admitting: Internal Medicine

## 2023-09-10 ENCOUNTER — Encounter: Payer: Self-pay | Admitting: Emergency Medicine

## 2023-10-18 DIAGNOSIS — H2513 Age-related nuclear cataract, bilateral: Secondary | ICD-10-CM | POA: Diagnosis not present

## 2023-10-18 DIAGNOSIS — H52203 Unspecified astigmatism, bilateral: Secondary | ICD-10-CM | POA: Diagnosis not present

## 2023-10-18 DIAGNOSIS — H5203 Hypermetropia, bilateral: Secondary | ICD-10-CM | POA: Diagnosis not present

## 2023-10-27 ENCOUNTER — Other Ambulatory Visit: Payer: Self-pay | Admitting: Emergency Medicine

## 2023-10-27 DIAGNOSIS — Z1231 Encounter for screening mammogram for malignant neoplasm of breast: Secondary | ICD-10-CM

## 2023-11-25 DIAGNOSIS — R6889 Other general symptoms and signs: Secondary | ICD-10-CM | POA: Diagnosis not present

## 2023-12-17 ENCOUNTER — Other Ambulatory Visit: Payer: Self-pay | Admitting: Emergency Medicine

## 2023-12-29 ENCOUNTER — Ambulatory Visit
Admission: RE | Admit: 2023-12-29 | Discharge: 2023-12-29 | Disposition: A | Discharge status: Home / Self Care | Source: Ambulatory Visit | Attending: Emergency Medicine | Admitting: Emergency Medicine

## 2023-12-29 ENCOUNTER — Ambulatory Visit

## 2023-12-29 ENCOUNTER — Encounter: Payer: Self-pay | Admitting: Family

## 2023-12-29 ENCOUNTER — Encounter: Payer: Self-pay | Admitting: Family Medicine

## 2023-12-29 ENCOUNTER — Ambulatory Visit (INDEPENDENT_AMBULATORY_CARE_PROVIDER_SITE_OTHER): Admitting: Family

## 2023-12-29 VITALS — BP 136/80 | HR 67 | Temp 97.7°F | Resp 20 | Ht 66.0 in | Wt 254.4 lb

## 2023-12-29 DIAGNOSIS — Z1231 Encounter for screening mammogram for malignant neoplasm of breast: Secondary | ICD-10-CM

## 2023-12-29 DIAGNOSIS — Z96653 Presence of artificial knee joint, bilateral: Secondary | ICD-10-CM

## 2023-12-29 DIAGNOSIS — E785 Hyperlipidemia, unspecified: Secondary | ICD-10-CM | POA: Diagnosis not present

## 2023-12-29 DIAGNOSIS — K219 Gastro-esophageal reflux disease without esophagitis: Secondary | ICD-10-CM

## 2023-12-29 DIAGNOSIS — D649 Anemia, unspecified: Secondary | ICD-10-CM

## 2023-12-29 DIAGNOSIS — I10 Essential (primary) hypertension: Secondary | ICD-10-CM | POA: Diagnosis not present

## 2023-12-29 DIAGNOSIS — Z7689 Persons encountering health services in other specified circumstances: Secondary | ICD-10-CM

## 2023-12-29 LAB — COMPLETE METABOLIC PANEL WITHOUT GFR
AG Ratio: 1.3 (calc) (ref 1.0–2.5)
ALT: 12 U/L (ref 6–29)
AST: 19 U/L (ref 10–35)
Albumin: 4.2 g/dL (ref 3.6–5.1)
Alkaline phosphatase (APISO): 114 U/L (ref 37–153)
BUN: 16 mg/dL (ref 7–25)
CO2: 30 mmol/L (ref 20–32)
Calcium: 9.8 mg/dL (ref 8.6–10.4)
Chloride: 103 mmol/L (ref 98–110)
Creat: 0.7 mg/dL (ref 0.60–1.00)
Globulin: 3.2 g/dL (ref 1.9–3.7)
Glucose, Bld: 91 mg/dL (ref 65–99)
Potassium: 4.2 mmol/L (ref 3.5–5.3)
Sodium: 140 mmol/L (ref 135–146)
Total Bilirubin: 0.7 mg/dL (ref 0.2–1.2)
Total Protein: 7.4 g/dL (ref 6.1–8.1)

## 2023-12-29 LAB — CBC WITH DIFFERENTIAL/PLATELET
Absolute Lymphocytes: 1706 {cells}/uL (ref 850–3900)
Absolute Monocytes: 454 {cells}/uL (ref 200–950)
Basophils Absolute: 32 {cells}/uL (ref 0–200)
Basophils Relative: 0.6 %
Eosinophils Absolute: 140 {cells}/uL (ref 15–500)
Eosinophils Relative: 2.6 %
HCT: 42.3 % (ref 35.0–45.0)
Hemoglobin: 13.4 g/dL (ref 11.7–15.5)
MCH: 28.9 pg (ref 27.0–33.0)
MCHC: 31.7 g/dL — ABNORMAL LOW (ref 32.0–36.0)
MCV: 91.4 fL (ref 80.0–100.0)
MPV: 11.5 fL (ref 7.5–12.5)
Monocytes Relative: 8.4 %
Neutro Abs: 3067 {cells}/uL (ref 1500–7800)
Neutrophils Relative %: 56.8 %
Platelets: 249 Thousand/uL (ref 140–400)
RBC: 4.63 Million/uL (ref 3.80–5.10)
RDW: 12.7 % (ref 11.0–15.0)
Total Lymphocyte: 31.6 %
WBC: 5.4 Thousand/uL (ref 3.8–10.8)

## 2023-12-29 LAB — LIPID PANEL
Cholesterol: 174 mg/dL (ref <200)
HDL: 53 mg/dL (ref >=50)
LDL Cholesterol (Calc): 105 mg/dL — ABNORMAL HIGH
Non-HDL Cholesterol (Calc): 121 mg/dL (ref <130)
Total CHOL/HDL Ratio: 3.3 (calc) (ref <5.0)
Triglycerides: 73 mg/dL (ref <150)

## 2023-12-29 LAB — TSH: TSH: 1.03 m[IU]/L (ref 0.40–4.50)

## 2023-12-29 MED ORDER — PRENATAL PLUS VITAMIN/MINERAL 27-1 MG PO TABS: 1.0000 | ORAL_TABLET | Freq: Every day | ORAL | 1 refills | Status: AC

## 2023-12-29 NOTE — Progress Notes (Signed)
 Provider: Roxan Plough FNP-C   Kimiya Brunelle, Roxan BROCKS, NP  Patient Care Team: Chauntay Paszkiewicz, Roxan BROCKS, NP as PCP - General (Family Medicine) Robinson Mayo, OD as Referring Physician [facility])  Extended Emergency Contact Information Primary Emergency Contact: Peri Fairy LABOR Address: [address]          [place], [place] [address] [place] Home Phone: [phone] Mobile Phone: [phone] Relation: Son Secondary Emergency Contact: Peri Fairy CROME Address: [address]          [place], [place] [address] [place] Home Phone: [phone] Mobile Phone: [phone] Relation: Spouse  Code Status:  Full Code  Goals of care: Advanced Directive information    12/29/2023   12:57 PM  Advanced Directives  Does Patient Have a Medical Advance Directive? No  Would patient like information on creating a medical advance directive? No - Patient declined     Chief Complaint  Patient presents with   Establish Care    New patient appointment.    Discussed the use of AI scribe software for clinical note transcription with the patient, who gave verbal consent to proceed.  History of Present Illness   Tara Moreno is a 78 year old female who presents to establish care for her chronic medical conditions.  She was recently seen at [facility] Emergency Department at [facility] on August 21, 2023, after a fall. She fell backwards off a step stool, landing on her back, and experienced pain in her lower back and tailbone area. She also had a headache and nausea, which prompted the ED visit. A CT scan of the head and cervical spine and an X-ray of the pelvis and sacrum showed no fractures. She declined a prescription for muscle relaxants and was advised to use over-the-counter medication and ice.  She has a history of high blood pressure, which she monitors at home. She reports a recent craving for salty foods, which has affected her blood pressure. She drinks 64  ounces of water  daily.  She has a history of acid reflux and has been taking pantoprazole  for 7 to 10 years, which controls her symptoms as long as she takes it regularly. She identifies spicy and fried foods as triggers for her symptoms and tries to follow a Mediterranean diet.  She was previously prediabetic in 2021 and 2022 but has since improved her A1c levels through dietary changes. She has a history of high cholesterol and is taking atorvastatin  10 mg daily.  She underwent bilateral knee and shoulder replacements, which have resolved her osteoarthritis in those areas. She occasionally experiences joint pain depending on the weather.  She does not take milk thistle, fish oil, or vitamin E anymore but continues with omega 3-6-9, vitamin D, pantoprazole , atorvastatin , amlodipine , and olmesartan . She does not take aspirin.  She reports a low hemoglobin level of 114, which prevented her from donating blood.  She experiences leg swelling when sitting for prolonged periods, such as during a month-long hospital stay with her sister, but it resolves overnight. Previous cardiology evaluations have been normal.  She plans to start exercising regularly by walking with a neighbor at the [facility] and aims to exercise daily.   Past Medical History:  Diagnosis Date   Arthritis    thumb joints, was in my knees, in my back (07/01/2017)   Frequent urination at night    GERD (gastroesophageal reflux disease)    Hyperlipemia    Hypertension    PONV (postoperative nausea and vomiting)  Past Surgical History:  Procedure Laterality Date   ABDOMINAL HYSTERECTOMY     partial   ADRENAL GLAND SURGERY Left 1990s   tumor on my adrenal gland removed   AUGMENTATION MAMMAPLASTY  2000   BUNIONECTOMY Left    CARDIAC CATHETERIZATION     no PCI; had before renal tumor was removed   COLONOSCOPY     EXCISION MASS UPPER EXTREMETIES Right 07/01/2017   Procedure: RIGHT SCAPULAR MASS EXCISION;  Surgeon: Kay Kemps, MD;  Location: [facility] OR;  Service: Orthopedics;  Laterality: Right;   FINGER DEBRIDEMENT  02/2022   JOINT REPLACEMENT     REVERSE SHOULDER ARTHROPLASTY Right 03/08/2014   Procedure: RIGHT TOTAL SHOULDER ARTHROPLASTY VERSES A REVERSE ARTHROPLASTY;  Surgeon: Elspeth JONELLE Kay, MD;  Location: [facility] OR;  Service: Orthopedics;  Laterality: Right;   REVERSE SHOULDER ARTHROPLASTY Left 07/05/2014   Procedure: LEFT REVERSE SHOULDER ARTHROPLASTY;  Surgeon: Elspeth JONELLE Kay, MD;  Location: [facility] OR;  Service: Orthopedics;  Laterality: Left;   REVISION TOTAL SHOULDER ARTHROPLASTY Right 03/08/2014   dr kay COKE MASS EXCISION Right 07/01/2017   TONSILLECTOMY     TOTAL KNEE ARTHROPLASTY Bilateral 10/09/2014   Procedure: BILATERAL TOTAL KNEE ARTHROPLASTY;  Surgeon: Dempsey Moan, MD;  Location: [facility] ORS;  Service: Orthopedics;  Laterality: Bilateral;  with general anesthesia   TUBAL LIGATION      No Known Allergies  Allergies as of 12/29/2023   No Known Allergies      Medication List        Accurate as of December 29, 2023  9:11 PM. If you have any questions, ask your nurse or doctor.          STOP taking these medications    aspirin 81 MG chewable tablet Stopped by: Roxan BROCKS Kobi Aller   FLAXSEED (LINSEED) PO Stopped by: Chaddrick Brue C Kyair Ditommaso   MILK THISTLE EXTRACT PO Stopped by: Kashawn Manzano C Andrez Lieurance   mometasone 0.1 % lotion Commonly known as: ELOCON Stopped by: Seren Chaloux C Donnel Venuto   multivitamin with minerals Tabs tablet Stopped by: Luie Laneve C Kierre Deines   SALMON OIL-1000 PO Stopped by: Adriana Quinby C Aasha Dina   VITAMIN E PO Stopped by: Master Touchet C Dakota Vanwart       TAKE these medications    amLODipine -olmesartan  10-40 MG tablet Commonly known as: AZOR  TAKE 1 TABLET BY MOUTH DAILY   atorvastatin  10 MG tablet Commonly known as: LIPITOR TAKE 1 TABLET BY MOUTH DAILY   BIOTIN PO Take by mouth daily.   calcium  carbonate 1500 (600 Ca) MG Tabs tablet Commonly known as: OSCAL Take 600 mg of elemental  calcium  by mouth 2 (two) times daily with a meal.   ondansetron  4 MG disintegrating tablet Commonly known as: ZOFRAN -ODT Take 4 mg by mouth as needed.   OVER THE COUNTER MEDICATION every other day.   pantoprazole  40 MG tablet Commonly known as: PROTONIX  TAKE 1 TABLET BY MOUTH DAILY   Prenatal Plus Vitamin/Mineral 27-1 MG Tabs Take 1 tablet by mouth daily after breakfast. Started by: Kaziyah Parkison C Natania Finigan   PROBIOTIC PO Take 1 capsule by mouth daily. ULTRA PROBIOTIC   Vitamin D (Ergocalciferol) 1.25 MG (50000 UNIT) Caps capsule Commonly known as: DRISDOL Take 50,000 Units by mouth every 7 (seven) days. What changed: when to take this        Review of Systems  Constitutional:  Negative for appetite change, chills, fatigue, fever and unexpected weight change.  HENT:  Negative for congestion, dental problem, ear discharge, ear pain, facial swelling,  hearing loss, nosebleeds, postnasal drip, rhinorrhea, sinus pressure, sinus pain, sneezing, sore throat, tinnitus and trouble swallowing.   Eyes:  Negative for pain, discharge, redness, itching and visual disturbance.  Respiratory:  Negative for cough, chest tightness, shortness of breath and wheezing.   Cardiovascular:  Negative for chest pain, palpitations and leg swelling.  Gastrointestinal:  Negative for abdominal distention, abdominal pain, blood in stool, constipation, diarrhea, nausea and vomiting.  Endocrine: Negative for cold intolerance, heat intolerance, polydipsia, polyphagia and polyuria.  Genitourinary:  Negative for difficulty urinating, dysuria, flank pain, frequency and urgency.  Musculoskeletal:  Negative for arthralgias, back pain, gait problem, joint swelling, myalgias, neck pain and neck stiffness.  Skin:  Negative for color change, pallor, rash and wound.  Neurological:  Negative for dizziness, syncope, speech difficulty, weakness, light-headedness, numbness and headaches.  Hematological:  Does not bruise/bleed easily.   Psychiatric/Behavioral:  Negative for agitation, behavioral problems, confusion, hallucinations, self-injury, sleep disturbance and suicidal ideas. The patient is not nervous/anxious.     Immunization History  Administered Date(s) Administered    sv, Bivalent, Protein Subunit Rsvpref,pf (Abrysvo) 02/23/2022   Fluad Quad(high Dose 65+) 02/13/2022   Fluzone Influenza virus vaccine,trivalent (IIV3), split virus 02/24/2017, 01/24/2018, 01/25/2019, 01/23/2020, 01/19/2021, 02/15/2022   Influenza-Unspecified 01/25/2023   PFIZER Comirnaty(Gray Top)Covid-19 Tri-Sucrose Vaccine 02/15/2022   PFIZER(Purple Top)SARS-COV-2 Vaccination 06/01/2019, 06/22/2019, 02/02/2020, 08/24/2020, 01/14/2021, 08/24/2021   PNEUMOCOCCAL CONJUGATE-20 02/13/2022   Pfizer Covid-19 Vaccine Bivalent Booster 65yrs & up 01/13/2021   Pfizer(Comirnaty)Fall Seasonal Vaccine 12 years and older 02/15/2022   Pneumococcal Polysaccharide-23 07/26/2016   Respiratory Syncytial Virus Vaccine,Recomb Aduvanted(Arexvy) 02/23/2022   Td 02/24/2023   Tdap 07/26/2016, 02/24/2023   Pertinent  Health Maintenance Due  Topic Date Due   INFLUENZA VACCINE  12/09/2023   Colonoscopy  05/13/2029   DEXA SCAN  Completed      09/01/2022    3:28 PM 03/01/2023    4:46 PM 03/02/2023    3:37 PM 03/03/2023    3:29 PM 12/29/2023   12:56 PM  Fall Risk  Falls in the past year? 1 1 1 1 1   Was there an injury with Fall? 0 1 1 1  0  Fall Risk Category Calculator 1 2 2 2 1   Patient at Risk for Falls Due to No Fall Risks   Impaired balance/gait No Fall Risks  Fall risk Follow up Falls evaluation completed  Falls evaluation completed;Education provided Falls evaluation completed Falls evaluation completed   Functional Status Survey:    Vitals:   12/29/23 1304  BP: 136/80  Pulse: 67  Resp: 20  Temp: 97.7 F (36.5 C)  SpO2: 97%  Weight: 254 lb 6.4 oz (115.4 kg)  Height: 5' 6 (1.676 m)   Body mass index is 41.06 kg/m. Physical Exam  VITALS: BP-  136/80 GENERAL: Alert, cooperative, well developed, no acute distress HEENT: Normocephalic, normal oropharynx, moist mucous membranes, ears and nose normal, no sinus tenderness NECK: Supple with tenderness CHEST: Clear to auscultation bilaterally, no wheezes, rhonchi, or crackles CARDIOVASCULAR: Normal heart rate and rhythm, S1 and S2 normal without murmurs ABDOMEN: Soft, non-tender, non-distended, without organomegaly, normal bowel sounds, liver normal EXTREMITIES: No cyanosis or edema, no calf tenderness MUSCULOSKELETAL: FROM without any tenderness  NEUROLOGICAL: Cranial nerves grossly intact, moves all extremities without gross motor or sensory deficit  SKIN: No rash,no lesion or erythema   PSYCHIATRY/BEHAVIORAL: Mood stable   Labs reviewed: No results for input(s): NA, K, CL, CO2, GLUCOSE, BUN, CREATININE, CALCIUM , MG, PHOS in the last 8760 hours. No  results for input(s): AST, ALT, ALKPHOS, BILITOT, PROT, ALBUMIN  in the last 8760 hours. No results for input(s): WBC, NEUTROABS, HGB, HCT, MCV, PLT in the last 8760 hours. No results found for: TSH Lab Results  Component Value Date   HGBA1C 5.0 09/01/2022   Lab Results  Component Value Date   CHOL 169 09/01/2022   HDL 51.90 09/01/2022   LDLCALC 93 09/01/2022   TRIG 122.0 09/01/2022   CHOLHDL 3 09/01/2022    Significant Diagnostic Results in last 30 days:  No results found.  Assessment/Plan  Establish Care  Establishing care for ongoing management of hypertension, hyperlipidemia, GERD, prediabetes, cardiomegaly, and mild anemia. History of osteoarthritis with bilateral knee and shoulder replacements, currently asymptomatic in those areas. - Establish care with this practice for ongoing management of chronic conditions.  Recent Fall with Pain Recent fall resulting in pain in the back, tailbone, neck, and shoulders. Imaging studies (CT of head and cervical spine, X-ray of pelvis and  sacrum) showed no fractures. Pain is managed with over-the-counter medications and non-pharmacological measures. - Continue over-the-counter pain management and non-pharmacological measures such as ice application. - Return to ED if symptoms worsen, including vomiting, confusion, or severe headache.  Essential Hypertension Hypertension with recent blood pressure reading of 136/80 mmHg. Recent increase in salt intake noted, contributing to elevated blood pressure. She is aware of dietary impact on blood pressure and is working to reduce salt intake. - Encourage reduction of salt intake to manage blood pressure. - Continue current antihypertensive medications: amlodipine  and olmesartan .  Hyperlipidemia Hyperlipidemia managed with atorvastatin . Regular follow-up is necessary to monitor lipid levels. - Continue atorvastatin  10 mg daily. - Schedule follow-up every six months to monitor cholesterol levels.  Gastroesophageal Reflux Disease (GERD) GERD managed with pantoprazole . Symptoms are controlled with medication adherence. Dietary triggers include spicy and fried foods. - Continue pantoprazole  as prescribed. - Encourage adherence to dietary modifications to avoid triggers.  Prediabetes Prediabetes with improved A1c following dietary changes. She is aware of dietary impact on blood sugar levels. - Continue dietary modifications to maintain blood sugar control.  Osteoarthritis Osteoarthritis with bilateral knee and shoulder replacements. Occasional joint pain noted, particularly in cold weather.  Cardiomegaly Cardiomegaly noted on past imaging. No current symptoms of heart failure. She is advised to manage blood pressure to prevent further cardiac strain. - Continue current antihypertensive regimen. - Encourage reduction of salt intake to prevent fluid retention and cardiac strain.  Mild Anemia Mild anemia with hemoglobin slightly below threshold for blood donation. Dietary modifications  and supplementation recommended to improve iron levels. - Prescribe prenatal multivitamin with iron. - Encourage consumption of iron-rich foods such as beets and dark leafy greens.   Family/ staff Communication: Reviewed plan of care with patient verbalized understanding   Labs/tests ordered:  - CBC with Differential/Platelet - CMP with eGFR([facility]) - TSH - Lipid panel   Next Appointment : Return in about 6 months (around 06/30/2024) for medical mangement of chronic issues.SABRA   Spent 45 minutes of Face to face and non-face to face with patient  >50% time spent counseling; reviewing medical record; tests; labs; documentation and developing future plan of care.   Roxan JAYSON Plough, NP

## 2024-01-03 ENCOUNTER — Ambulatory Visit: Payer: Self-pay | Admitting: Emergency Medicine

## 2024-01-10 ENCOUNTER — Ambulatory Visit: Payer: Self-pay | Admitting: Family

## 2024-01-13 ENCOUNTER — Ambulatory Visit: Payer: Self-pay

## 2024-01-13 NOTE — Telephone Encounter (Signed)
 Patient notified and stated that she does not feel like making a virtual appointment nor going to Urgent Care. Will call back if symptoms worsen.

## 2024-01-13 NOTE — Telephone Encounter (Signed)
 FYI Only or Action Required?: Action required by provider: clinical question for provider.  Patient was last seen in primary care on 12/29/2023 by Ngetich, Roxan BROCKS, NP.  Called Nurse Triage reporting Covid Positive.  Symptoms began yesterday.  Interventions attempted: Rest, hydration, or home remedies.  Symptoms are: gradually worsening.  Triage Disposition: Call PCP Within 24 Hours  Patient/caregiver understands and will follow disposition?: YesCopied from CRM #[id number]. Topic: Clinical - Red Word Triage >> Jan 13, 2024 12:50 PM Mercer PEDLAR wrote: Red Word that prompted transfer to Nurse Triage: Patient tested positive for Covid this morning 01/13/24 and is requesting prescription for paxlovid. Patient has slight dizziness. Reason for Disposition  [1] Patient is NOT HIGH RISK AND [2] strongly requests antiviral medicine AND [3] COVID-19 symptoms present < 5 days  Answer Assessment - Initial Assessment Questions Pt took home covid test this morning and showed positive. Pt has been on [place] cruise and returned Monday. Pt is requesting paxlovid to be called in. Pt doesn't feel like coming in for appt. RN advised pt to seek care at [facility] if symptoms worsen. Please advise.      1. SYMPTOMS: What is your main symptom or concern? (e.g., cough, fever, shortness of breath, muscle aches)     congestion 2. ONSET: When did the symptoms start?      yesterday 3. COUGH: Do you have a cough? If Yes, ask: How bad is the cough?       yes 4. FEVER: Do you have a fever? If Yes, ask: What is your temperature, how was it measured, and when did it start?     denies 5. BREATHING DIFFICULTY: Are you having any difficulty breathing? (e.g., normal; shortness of breath, wheezing, unable to speak)      denies 6. BETTER-SAME-WORSE: Are you getting better, staying the same or getting worse compared to yesterday?  If getting worse, ask, In what way?     worse 7. OTHER SYMPTOMS: Do you have any other  symptoms?  (e.g., chills, fatigue, headache, loss of smell or taste, muscle pain, sore throat)     chill 8. COVID-19 DIAGNOSIS: How do you know that you have COVID? (e.g., positive lab test or self-test, diagnosed by doctor or NP/PA, symptoms after exposure).     Home test-positive  9. COVID-19 EXPOSURE: Was there any known exposure to COVID before the symptoms began?      Been on cruise 10. COVID-19 VACCINE: Have you had the COVID-19 vaccine? If Yes, ask: When did you last get it?       na 11. HIGH RISK DISEASE: Do you have any chronic medical problems? (e.g., asthma, heart or lung disease, weak immune system, obesity, etc.)       denies  Protocols used: COVID-19 - Diagnosed or Suspected-A-AH

## 2024-01-13 NOTE — Telephone Encounter (Signed)
 Recommend virtual visit or urgent care for evaluation.

## 2024-01-23 ENCOUNTER — Other Ambulatory Visit: Payer: Self-pay | Admitting: Emergency Medicine

## 2024-02-15 NOTE — Telephone Encounter (Signed)
 error

## 2024-05-30 ENCOUNTER — Other Ambulatory Visit: Payer: Self-pay | Admitting: Family

## 2024-05-30 DIAGNOSIS — Z1231 Encounter for screening mammogram for malignant neoplasm of breast: Secondary | ICD-10-CM

## 2024-06-01 IMAGING — MG DIGITAL SCREENING BREAST BILAT IMPLANT W/ TOMO W/ CAD
8 of 14 series · 8 of 34 positions shown · non-contrast
Comparison: Previous exam(s).

CLINICAL DATA: Screening.

EXAM:
DIGITAL SCREENING BILATERAL MAMMOGRAM WITH IMPLANTS, CAD AND
TOMOSYNTHESIS
TECHNIQUE: Bilateral screening digital craniocaudal and mediolateral oblique
mammograms were obtained. Bilateral screening digital breast
tomosynthesis was performed. The images were evaluated with
computer-aided detection. Standard and/or implant displaced views
were performed.

[L MLO]
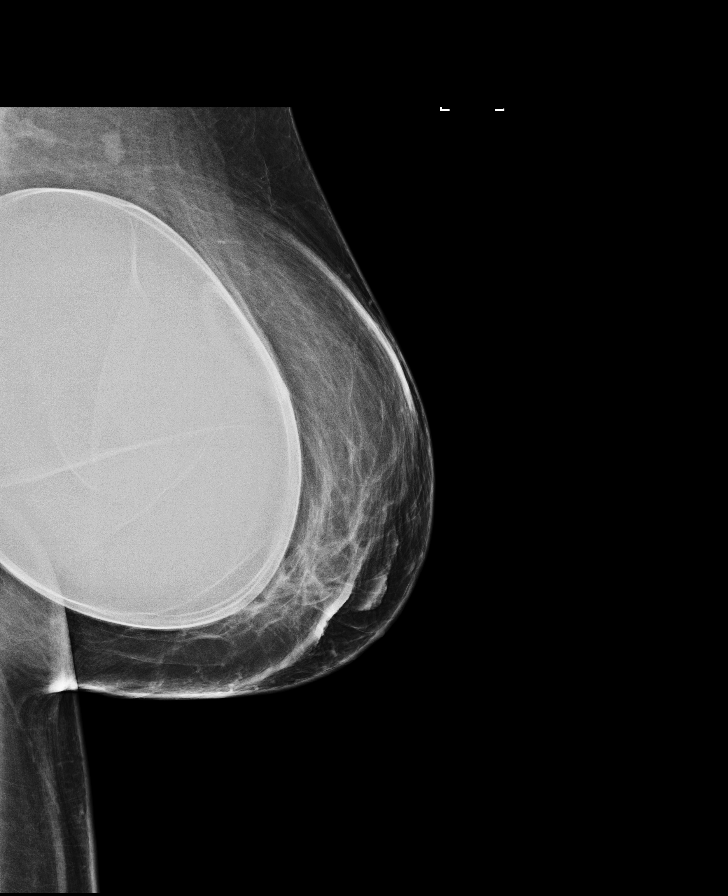

[L CC]
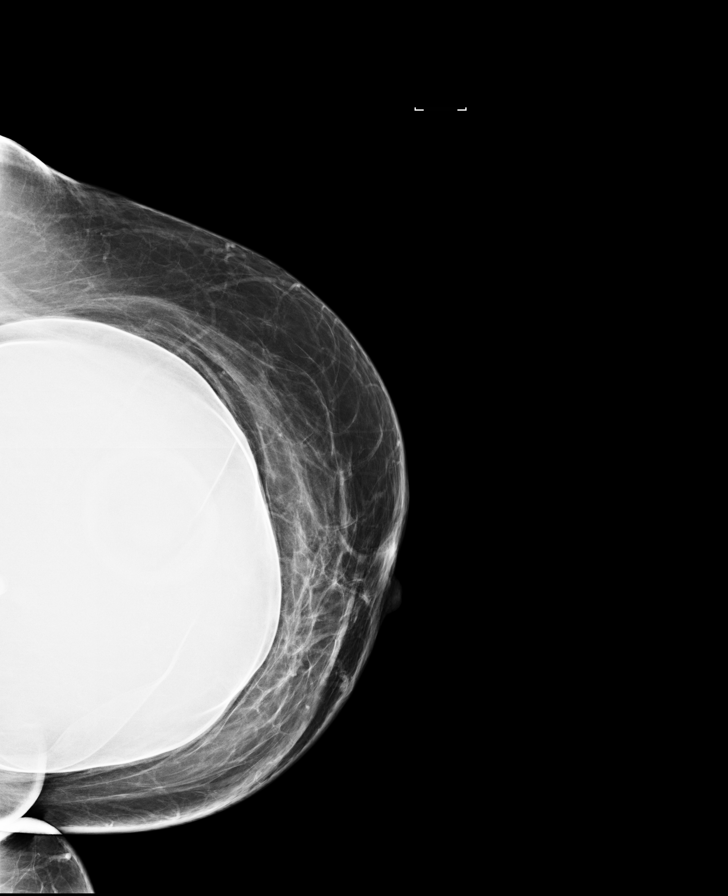

[R CC]
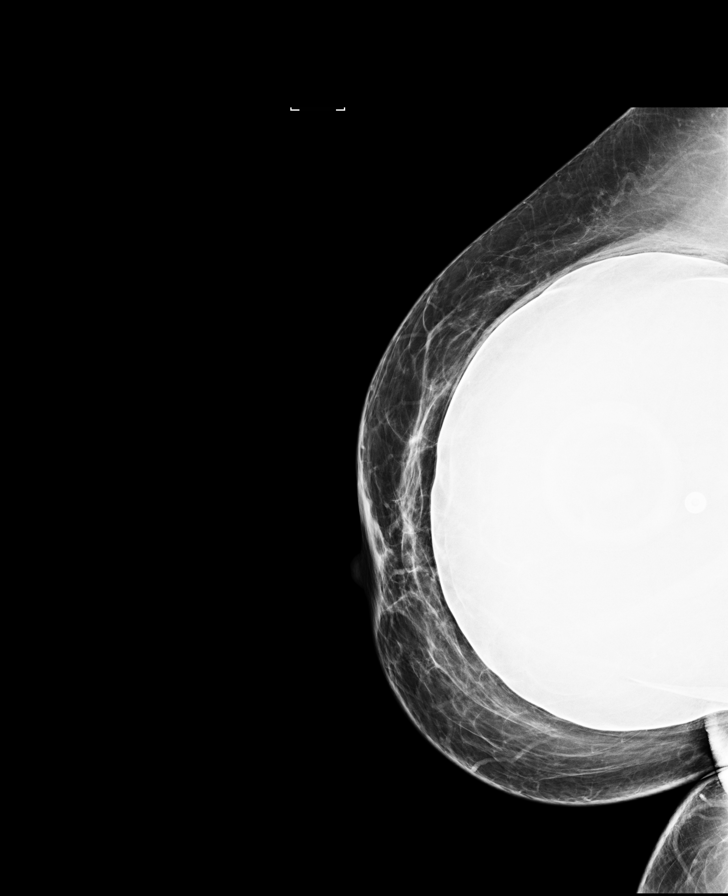

[R MLO]
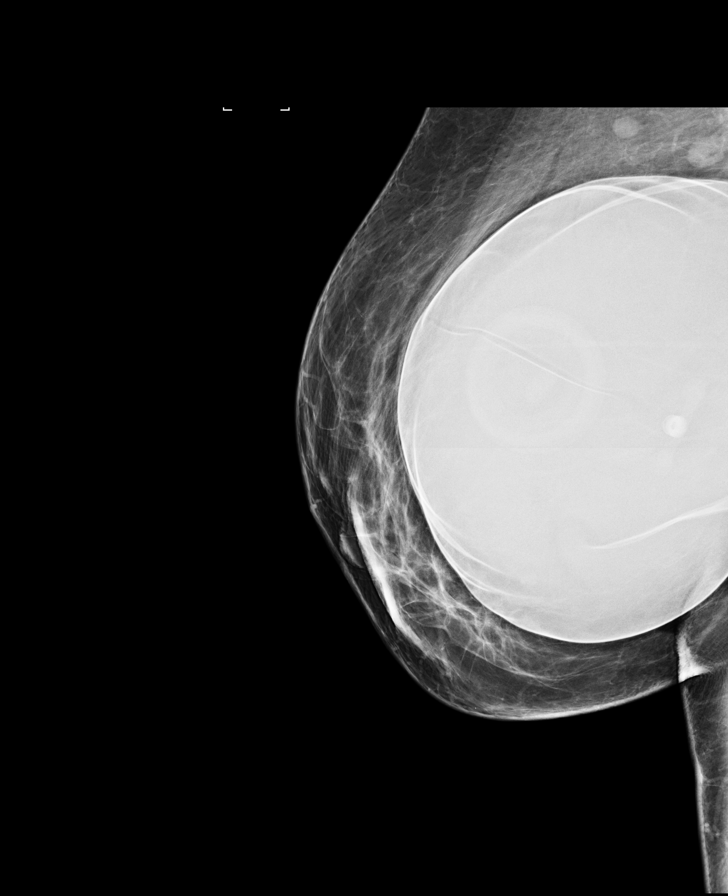

[L MLO synth-2D (1 of 2)]
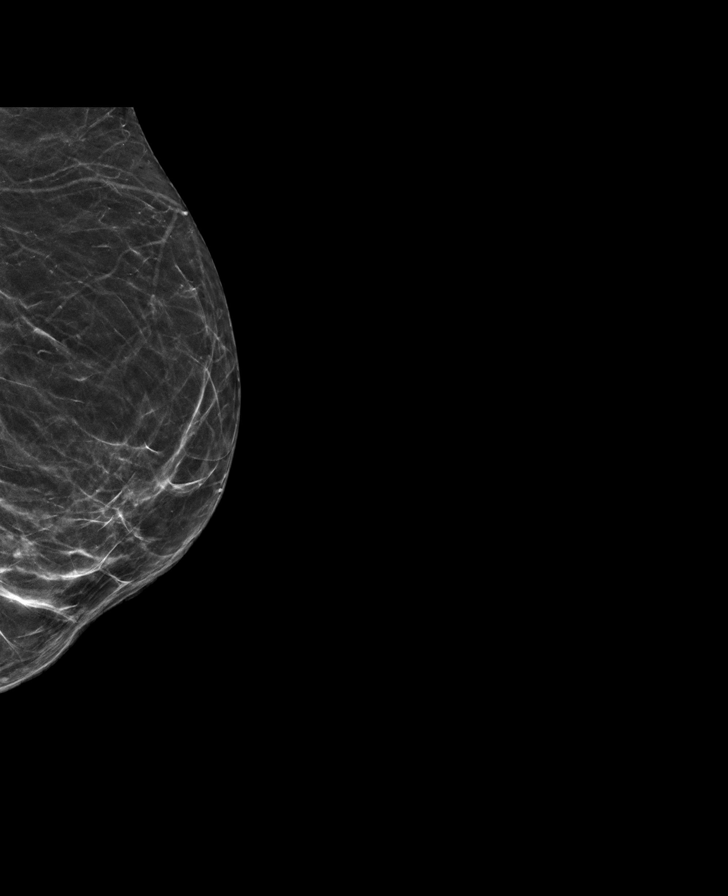

[R MLO synth-2D]
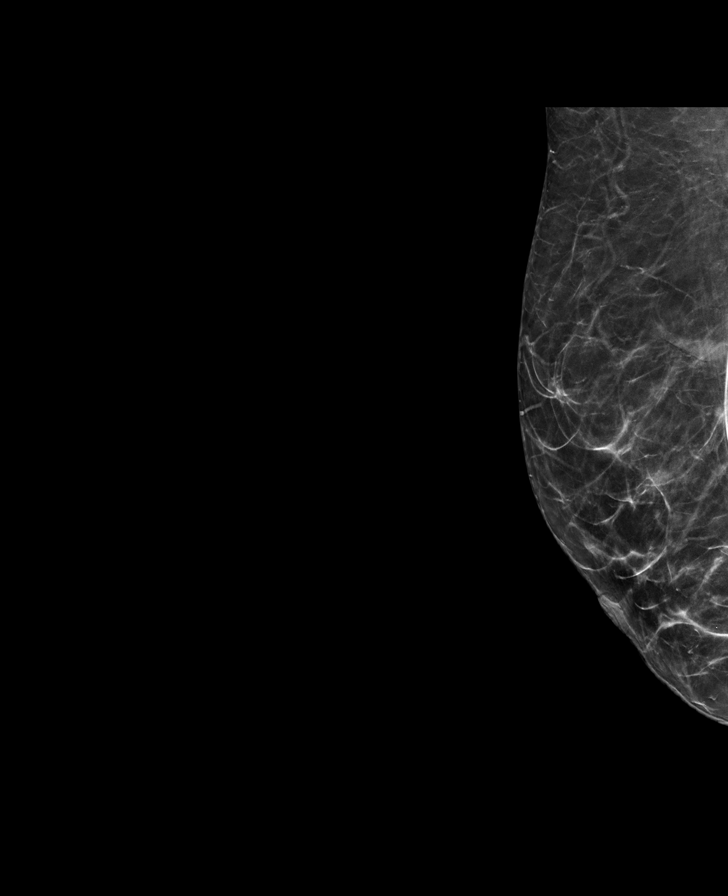

[R CC synth-2D]
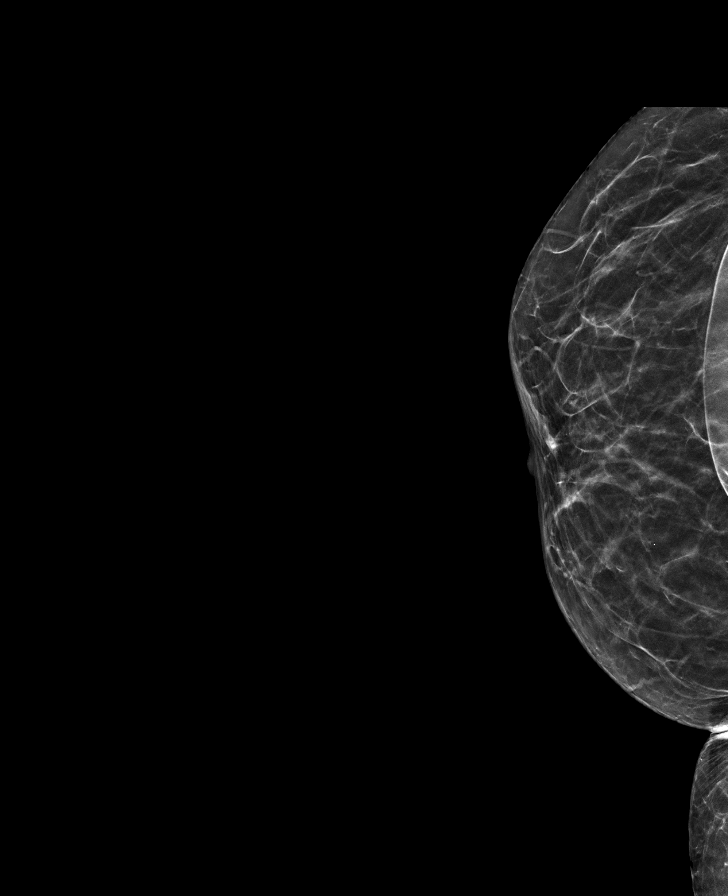

[L MLO synth-2D (2 of 2)]
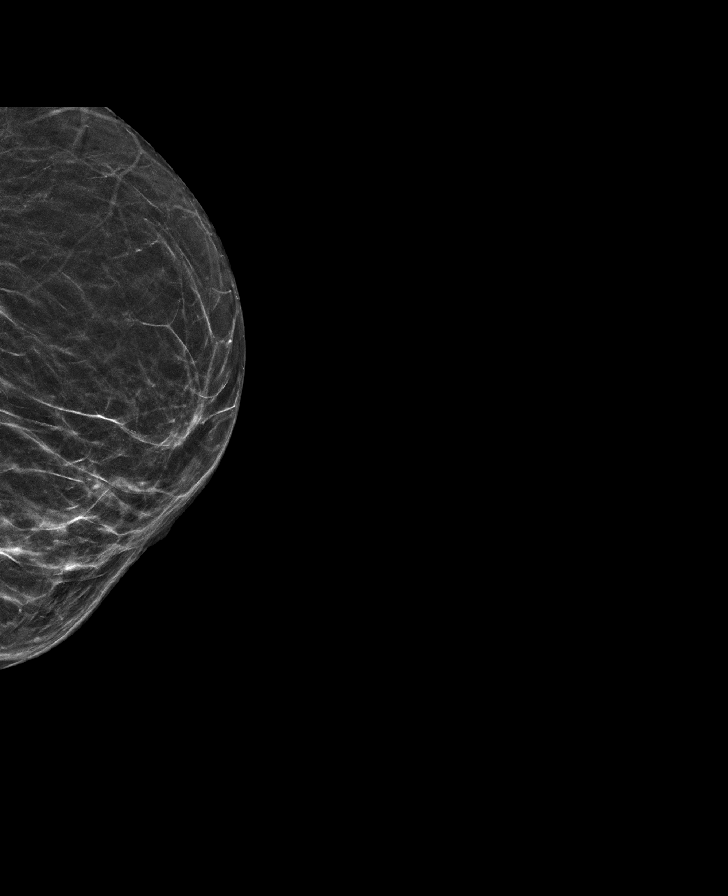

[8 of 34 positions shown; findings below may reference images not displayed]

ACR Breast Density Category b: There are scattered areas of
fibroglandular density.
FINDINGS: The patient has retropectoral implants. There are no findings
suspicious for malignancy.
IMPRESSION: No mammographic evidence of malignancy. A result letter of this
screening mammogram will be mailed directly to the patient.

RECOMMENDATION:
Screening mammogram in one year. (Code:SE-S-JMG)

BI-RADS CATEGORY  1:  Negative.

## 2024-07-03 ENCOUNTER — Ambulatory Visit: Payer: Self-pay | Admitting: Family

## 2025-01-01 ENCOUNTER — Ambulatory Visit
